# Patient Record
Sex: Female | Born: 1935 | Race: White | Hispanic: No | Smoking: Never smoker
Health system: Southern US, Community
[De-identification: ages and names within clinical notes are randomized; demographics above are authoritative.]

## PROBLEM LIST (undated history)

## (undated) DIAGNOSIS — M353 Polymyalgia rheumatica: Secondary | ICD-10-CM

## (undated) DIAGNOSIS — I10 Essential (primary) hypertension: Secondary | ICD-10-CM

## (undated) DIAGNOSIS — I4891 Unspecified atrial fibrillation: Secondary | ICD-10-CM

## (undated) DIAGNOSIS — E876 Hypokalemia: Secondary | ICD-10-CM

## (undated) DIAGNOSIS — I517 Cardiomegaly: Secondary | ICD-10-CM

## (undated) DIAGNOSIS — R002 Palpitations: Secondary | ICD-10-CM

## (undated) DIAGNOSIS — G479 Sleep disorder, unspecified: Secondary | ICD-10-CM

## (undated) DIAGNOSIS — C801 Malignant (primary) neoplasm, unspecified: Secondary | ICD-10-CM

## (undated) DIAGNOSIS — K219 Gastro-esophageal reflux disease without esophagitis: Secondary | ICD-10-CM

## (undated) DIAGNOSIS — E039 Hypothyroidism, unspecified: Secondary | ICD-10-CM

## (undated) DIAGNOSIS — I509 Heart failure, unspecified: Secondary | ICD-10-CM

## (undated) DIAGNOSIS — Z7901 Long term (current) use of anticoagulants: Secondary | ICD-10-CM

## (undated) DIAGNOSIS — H18509 Unspecified hereditary corneal dystrophies, unspecified eye: Secondary | ICD-10-CM

## (undated) DIAGNOSIS — U071 COVID-19: Secondary | ICD-10-CM

## (undated) DIAGNOSIS — Z87442 Personal history of urinary calculi: Secondary | ICD-10-CM

## (undated) DIAGNOSIS — I38 Endocarditis, valve unspecified: Secondary | ICD-10-CM

## (undated) DIAGNOSIS — I700 Atherosclerosis of aorta: Secondary | ICD-10-CM

## (undated) DIAGNOSIS — M1990 Unspecified osteoarthritis, unspecified site: Secondary | ICD-10-CM

## (undated) DIAGNOSIS — I5020 Unspecified systolic (congestive) heart failure: Secondary | ICD-10-CM

## (undated) DIAGNOSIS — F32A Depression, unspecified: Secondary | ICD-10-CM

## (undated) DIAGNOSIS — M86671 Other chronic osteomyelitis, right ankle and foot: Secondary | ICD-10-CM

## (undated) DIAGNOSIS — J189 Pneumonia, unspecified organism: Secondary | ICD-10-CM

## (undated) DIAGNOSIS — E785 Hyperlipidemia, unspecified: Secondary | ICD-10-CM

## (undated) HISTORY — PX: FOOT SURGERY: SHX648

## (undated) HISTORY — PX: MASTECTOMY: SHX3

## (undated) HISTORY — PX: COLONOSCOPY: SHX174

---

## 1982-07-12 DIAGNOSIS — C50919 Malignant neoplasm of unspecified site of unspecified female breast: Secondary | ICD-10-CM

## 1982-07-12 HISTORY — DX: Malignant neoplasm of unspecified site of unspecified female breast: C50.919

## 2004-05-18 ENCOUNTER — Ambulatory Visit: Payer: Self-pay | Admitting: Family Medicine

## 2005-03-22 ENCOUNTER — Ambulatory Visit: Payer: Self-pay | Admitting: Ophthalmology

## 2005-03-29 ENCOUNTER — Ambulatory Visit: Payer: Self-pay | Admitting: Ophthalmology

## 2005-05-24 ENCOUNTER — Ambulatory Visit: Payer: Self-pay | Admitting: Ophthalmology

## 2005-05-31 ENCOUNTER — Ambulatory Visit: Payer: Self-pay | Admitting: Ophthalmology

## 2005-06-21 ENCOUNTER — Ambulatory Visit: Payer: Self-pay | Admitting: Family Medicine

## 2005-09-22 ENCOUNTER — Ambulatory Visit: Payer: Self-pay | Admitting: Family Medicine

## 2005-09-23 ENCOUNTER — Ambulatory Visit: Payer: Self-pay | Admitting: Family Medicine

## 2006-06-30 ENCOUNTER — Ambulatory Visit: Payer: Self-pay | Admitting: Family Medicine

## 2006-07-11 ENCOUNTER — Ambulatory Visit: Payer: Self-pay | Admitting: Family Medicine

## 2006-08-17 ENCOUNTER — Emergency Department: Payer: Self-pay | Admitting: Unknown Physician Specialty

## 2006-08-17 ENCOUNTER — Other Ambulatory Visit: Payer: Self-pay

## 2009-12-25 ENCOUNTER — Ambulatory Visit: Payer: Self-pay | Admitting: Internal Medicine

## 2010-01-28 ENCOUNTER — Ambulatory Visit: Payer: Self-pay | Admitting: Internal Medicine

## 2010-01-30 ENCOUNTER — Ambulatory Visit: Payer: Self-pay | Admitting: Internal Medicine

## 2010-02-02 ENCOUNTER — Ambulatory Visit: Payer: Self-pay | Admitting: Internal Medicine

## 2011-08-31 ENCOUNTER — Ambulatory Visit: Payer: Self-pay | Admitting: Rheumatology

## 2012-07-27 ENCOUNTER — Emergency Department: Payer: Self-pay | Admitting: Emergency Medicine

## 2012-07-27 LAB — CK TOTAL AND CKMB (NOT AT ARMC)
CK, Total: 81 U/L (ref 21–215)
CK-MB: 1.2 ng/mL (ref 0.5–3.6)

## 2012-07-27 LAB — COMPREHENSIVE METABOLIC PANEL
Alkaline Phosphatase: 82 U/L (ref 50–136)
Anion Gap: 10 (ref 7–16)
BUN: 22 mg/dL — ABNORMAL HIGH (ref 7–18)
Bilirubin,Total: 0.6 mg/dL (ref 0.2–1.0)
Calcium, Total: 9 mg/dL (ref 8.5–10.1)
Co2: 21 mmol/L (ref 21–32)
Creatinine: 1.03 mg/dL (ref 0.60–1.30)
EGFR (African American): 57 — ABNORMAL LOW
EGFR (Non-African Amer.): 50 — ABNORMAL LOW
Osmolality: 281 (ref 275–301)
Potassium: 4.2 mmol/L (ref 3.5–5.1)
SGPT (ALT): 33 U/L (ref 12–78)
Sodium: 136 mmol/L (ref 136–145)

## 2012-07-27 LAB — CBC
HCT: 42.4 % (ref 35.0–47.0)
HGB: 14.4 g/dL (ref 12.0–16.0)
MCH: 31.8 pg (ref 26.0–34.0)
MCHC: 34.1 g/dL (ref 32.0–36.0)
Platelet: 392 10*3/uL (ref 150–440)
RBC: 4.54 10*6/uL (ref 3.80–5.20)
WBC: 10.6 10*3/uL (ref 3.6–11.0)

## 2012-07-27 LAB — MAGNESIUM: Magnesium: 1.7 mg/dL — ABNORMAL LOW

## 2012-12-20 DIAGNOSIS — I10 Essential (primary) hypertension: Secondary | ICD-10-CM | POA: Insufficient documentation

## 2018-05-29 ENCOUNTER — Emergency Department
Admission: EM | Admit: 2018-05-29 | Discharge: 2018-05-29 | Payer: Medicare Other | Attending: Emergency Medicine | Admitting: Emergency Medicine

## 2018-05-29 ENCOUNTER — Emergency Department: Payer: Medicare Other

## 2018-05-29 DIAGNOSIS — Z7901 Long term (current) use of anticoagulants: Secondary | ICD-10-CM | POA: Diagnosis not present

## 2018-05-29 DIAGNOSIS — E785 Hyperlipidemia, unspecified: Secondary | ICD-10-CM | POA: Diagnosis not present

## 2018-05-29 DIAGNOSIS — R509 Fever, unspecified: Secondary | ICD-10-CM | POA: Diagnosis not present

## 2018-05-29 DIAGNOSIS — R2241 Localized swelling, mass and lump, right lower limb: Secondary | ICD-10-CM | POA: Diagnosis not present

## 2018-05-29 DIAGNOSIS — L03115 Cellulitis of right lower limb: Secondary | ICD-10-CM | POA: Diagnosis not present

## 2018-05-29 DIAGNOSIS — Y69 Unspecified misadventure during surgical and medical care: Secondary | ICD-10-CM | POA: Diagnosis not present

## 2018-05-29 DIAGNOSIS — A419 Sepsis, unspecified organism: Secondary | ICD-10-CM | POA: Diagnosis present

## 2018-05-29 DIAGNOSIS — T8140XA Infection following a procedure, unspecified, initial encounter: Secondary | ICD-10-CM | POA: Insufficient documentation

## 2018-05-29 DIAGNOSIS — Z79899 Other long term (current) drug therapy: Secondary | ICD-10-CM | POA: Diagnosis not present

## 2018-05-29 HISTORY — DX: Hyperlipidemia, unspecified: E78.5

## 2018-05-29 LAB — COMPREHENSIVE METABOLIC PANEL
ALBUMIN: 2.9 g/dL — AB (ref 3.5–5.0)
ALT: 29 U/L (ref 0–44)
AST: 38 U/L (ref 15–41)
Alkaline Phosphatase: 102 U/L (ref 38–126)
Anion gap: 8 (ref 5–15)
BILIRUBIN TOTAL: 1 mg/dL (ref 0.3–1.2)
BUN: 30 mg/dL — ABNORMAL HIGH (ref 8–23)
CHLORIDE: 95 mmol/L — AB (ref 98–111)
CO2: 28 mmol/L (ref 22–32)
CREATININE: 0.91 mg/dL (ref 0.44–1.00)
Calcium: 8 mg/dL — ABNORMAL LOW (ref 8.9–10.3)
GFR calc Af Amer: >60 mL/min (ref >60)
GFR calc non Af Amer: 54 mL/min — ABNORMAL LOW (ref >60)
GLUCOSE: 130 mg/dL — AB (ref 70–99)
POTASSIUM: 4 mmol/L (ref 3.5–5.1)
Sodium: 131 mmol/L — ABNORMAL LOW (ref 135–145)
Total Protein: 6.4 g/dL — ABNORMAL LOW (ref 6.5–8.1)

## 2018-05-29 LAB — CBC WITH DIFFERENTIAL/PLATELET
ABS IMMATURE GRANULOCYTES: 0.23 10*3/uL — AB (ref 0.00–0.07)
BASOS ABS: 0.1 10*3/uL (ref 0.0–0.1)
Basophils Relative: 0 %
Eosinophils Absolute: 0 10*3/uL (ref 0.0–0.5)
Eosinophils Relative: 0 %
HCT: 37.1 % (ref 36.0–46.0)
HEMOGLOBIN: 11.8 g/dL — AB (ref 12.0–15.0)
IMMATURE GRANULOCYTES: 1 %
LYMPHS ABS: 0.8 10*3/uL (ref 0.7–4.0)
LYMPHS PCT: 4 %
MCH: 30.5 pg (ref 26.0–34.0)
MCHC: 31.8 g/dL (ref 30.0–36.0)
MCV: 95.9 fL (ref 80.0–100.0)
MONOS PCT: 7 %
Monocytes Absolute: 1.5 10*3/uL — ABNORMAL HIGH (ref 0.1–1.0)
NEUTROS ABS: 19 10*3/uL — AB (ref 1.7–7.7)
NEUTROS PCT: 88 %
NRBC: 0 % (ref 0.0–0.2)
Platelets: 528 10*3/uL — ABNORMAL HIGH (ref 150–400)
RBC: 3.87 MIL/uL (ref 3.87–5.11)
RDW: 13.2 % (ref 11.5–15.5)
WBC: 21.6 10*3/uL — ABNORMAL HIGH (ref 4.0–10.5)

## 2018-05-29 LAB — PROTIME-INR
INR: 1.55
Prothrombin Time: 18.4 seconds — ABNORMAL HIGH (ref 11.4–15.2)

## 2018-05-29 LAB — BRAIN NATRIURETIC PEPTIDE: B Natriuretic Peptide: 374 pg/mL — ABNORMAL HIGH (ref 0.0–100.0)

## 2018-05-29 LAB — TROPONIN I: Troponin I: 0.03 ng/mL (ref <0.03)

## 2018-05-29 MED ORDER — SODIUM CHLORIDE 0.9 % IV BOLUS
500.0000 mL | Freq: Once | INTRAVENOUS | Status: AC
  Administered 2018-05-29: 500 mL via INTRAVENOUS

## 2018-05-29 MED ORDER — FENTANYL CITRATE (PF) 100 MCG/2ML IJ SOLN
50.0000 ug | Freq: Once | INTRAMUSCULAR | Status: AC
  Administered 2018-05-29: 50 ug via INTRAVENOUS
  Filled 2018-05-29: qty 2

## 2018-05-29 MED ORDER — VANCOMYCIN HCL IN DEXTROSE 1-5 GM/200ML-% IV SOLN
1000.0000 mg | Freq: Once | INTRAVENOUS | Status: AC
  Administered 2018-05-29: 1000 mg via INTRAVENOUS
  Filled 2018-05-29: qty 200

## 2018-05-29 MED ORDER — SODIUM CHLORIDE 0.9 % IV SOLN
2.0000 g | INTRAVENOUS | Status: DC
  Administered 2018-05-29: 2 g via INTRAVENOUS
  Filled 2018-05-29: qty 20

## 2018-05-29 MED ORDER — ONDANSETRON HCL 4 MG/2ML IJ SOLN
INTRAMUSCULAR | Status: AC
  Administered 2018-05-29: 10:00:00
  Filled 2018-05-29: qty 2

## 2018-05-29 MED ORDER — ACETAMINOPHEN 325 MG PO TABS
650.0000 mg | ORAL_TABLET | Freq: Once | ORAL | Status: AC
  Administered 2018-05-29: 650 mg via ORAL

## 2018-05-29 NOTE — ED Notes (Signed)
ED TO INPATIENT HANDOFF REPORT  Name/Age/Gender Judith Keller 84 y.o. female  Code Status   Home/SNF/Other Rehab  Chief Complaint sepsis ems  Level of Care/Admitting Diagnosis ED Disposition    ED Disposition Condition Comment   Transfer to Another Facility  The patient appears reasonably stabilized for transfer considering the current resources, flow, and capabilities available in the ED at this time, and I doubt any other [facility] requiring further screening and/or treatment in the ED prior to transfer is p resent.       Medical History Past Medical History:  Diagnosis Date  . Hyperlipidemia     Allergies Not on File  IV Location/Drains/Wounds Patient Lines/Drains/Airways Status   Active Line/Drains/Airways    Name:   Placement date:   Placement time:   Site:   Days:   Peripheral IV 05/29/18 Left Wrist   05/29/18    0757    Wrist   less than 1   Peripheral IV 05/29/18 Left Forearm   05/29/18    0822    Forearm   less than 1          Labs/Imaging Results for orders placed or performed during the hospital encounter of 05/29/18 (from the past 48 hour(s))  Comprehensive metabolic panel     Status: Abnormal   Collection Time: 05/29/18  8:00 AM  Result Value Ref Range   Sodium 131 (L) 135 - 145 mmol/L   Potassium 4.0 3.5 - 5.1 mmol/L   Chloride 95 (L) 98 - 111 mmol/L   CO2 28 22 - 32 mmol/L   Glucose, Bld 130 (H) 70 - 99 mg/dL   BUN 30 (H) 8 - 23 mg/dL   Creatinine, Ser 0.91 0.44 - 1.00 mg/dL   Calcium 8.0 (L) 8.9 - 10.3 mg/dL   Total Protein 6.4 (L) 6.5 - 8.1 g/dL   Albumin 2.9 (L) 3.5 - 5.0 g/dL   AST 38 15 - 41 U/L   ALT 29 0 - 44 U/L   Alkaline Phosphatase 102 38 - 126 U/L   Total Bilirubin 1.0 0.3 - 1.2 mg/dL   GFR calc non Af Amer 54 (L) >60 mL/min   GFR calc Af Amer >60 >60 mL/min    Comment: (NOTE) The eGFR has been calculated using the CKD EPI equation. This calculation has not been validated in all clinical situations. eGFR's persistently <60  mL/min signify possible Chronic Kidney Disease.    Anion gap 8 5 - 15    Comment: Performed at [facility], [address] [place], [place] [address]  Brain natriuretic peptide     Status: Abnormal   Collection Time: 05/29/18  8:00 AM  Result Value Ref Range   B Natriuretic Peptide 374.0 (H) 0.0 - 100.0 pg/mL    Comment: Performed at [facility], [address] [place], [place] [address]  Troponin I - ONCE - STAT     Status: Abnormal   Collection Time: 05/29/18  8:00 AM  Result Value Ref Range   Troponin I 0.03 (HH) <0.03 ng/mL    Comment: CRITICAL RESULT CALLED TO, READ BACK BY AND VERIFIED WITH CASSIE [facility] AT [place] 05/29/18 DAS Performed at [facility], [address] [place], [place] [address]   CBC WITH DIFFERENTIAL     Status: Abnormal   Collection Time: 05/29/18  8:00 AM  Result Value Ref Range   WBC 21.6 (H) 4.0 - 10.5 K/uL   RBC 3.87 3.87 - 5.11 MIL/uL  Hemoglobin 11.8 (L) 12.0 - 15.0 g/dL   HCT 37.1 36.0 - 46.0 %   MCV 95.9 80.0 - 100.0 fL   MCH 30.5 26.0 - 34.0 pg   MCHC 31.8 30.0 - 36.0 g/dL   RDW 13.2 11.5 - 15.5 %   Platelets 528 (H) 150 - 400 K/uL   nRBC 0.0 0.0 - 0.2 %   Neutrophils Relative % 88 %   Neutro Abs 19.0 (H) 1.7 - 7.7 K/uL   Lymphocytes Relative 4 %   Lymphs Abs 0.8 0.7 - 4.0 K/uL   Monocytes Relative 7 %   Monocytes Absolute 1.5 (H) 0.1 - 1.0 K/uL   Eosinophils Relative 0 %   Eosinophils Absolute 0.0 0.0 - 0.5 K/uL   Basophils Relative 0 %   Basophils Absolute 0.1 0.0 - 0.1 K/uL   Immature Granulocytes 1 %   Abs Immature Granulocytes 0.23 (H) 0.00 - 0.07 K/uL    Comment: Performed at [facility], [address] [place], [place] [address]  Protime-INR     Status: Abnormal   Collection Time: 05/29/18  8:00 AM  Result Value Ref Range   Prothrombin Time 18.4 (H) 11.4 - 15.2 seconds   INR 1.55     Comment: Performed at [facility], [address], [place], [place] [address]   Dg Ankle  Complete Right  Result Date: 05/29/2018 CLINICAL DATA:  Fever, pain, and drainage from surgical site at the right ankle. The patient undergone previous ORIF for a bimalleolar fracture. EXAM: RIGHT ANKLE - COMPLETE 3+ VIEW COMPARISON:  None in PACs FINDINGS: There is diffuse soft tissue swelling. There is calcification in the soft tissues superficial to the medial aspect of the distal tibial metaphysis. The medial malleolar fracture remains ununited. The surgical pins at this site are in reasonable position. The fracture line obliquely through the distal fibular metaphysis remains visible. The metallic plate and cortical screws appear reasonable in position. The ankle joint mortise is mildly widened medially. The talus and calcaneus are grossly intact. IMPRESSION: Diffuse soft tissue swelling which may reflect cellulitis. No objective evidence of osteomyelitis. The fracture lines which have undergone ORIF remain visible. Electronically Signed   By: David  Martinique M.D.   On: 05/29/2018 08:48   Dg Chest Port 1 View  Result Date: 05/29/2018 CLINICAL DATA:  Pain and fever and range from fracture site EXAM: PORTABLE CHEST 1 VIEW COMPARISON:  Portable chest x-ray of July 27, 2012 FINDINGS: The lungs are less well inflated today. There are coarse lung markings in the right correction left upper lobe and left mid to lower lung suggesting atelectasis. The cardiac silhouette is enlarged. The pulmonary vascularity is not engorged. The observed bony structures are unremarkable. IMPRESSION: Probable subsegmental atelectasis in the left lung. Borderline hypoinflation. When the patient can tolerate the procedure a PA and lateral chest x-ray with deep inspiratory effort would be useful. Electronically Signed   By: David  Martinique M.D.   On: 05/29/2018 08:46    Pending Labs Unresulted Labs (From admission, onward)    Start     Ordered   05/29/18 0816  Blood Culture (routine x 2)  BLOOD CULTURE X 2,   STAT     05/29/18  0815   05/29/18 0816  Urinalysis, Complete w Microscopic  ONCE - STAT,   STAT     05/29/18 0815          Vitals/Pain Today's Vitals   05/29/18 0930 05/29/18 0945 05/29/18 1000 05/29/18 1009  BP:   123/65 123/65  Pulse:  (!) 50  (!) 124  Resp: (!) 25 (!) 27 (!) 26 (!) 25  Temp:    99.9 F (37.7 C)  TempSrc:      SpO2:  (!) 76%  94%  Weight:        Isolation Precautions No active isolations  Medications Medications  vancomycin (VANCOCIN) IVPB 1000 mg/200 mL premix (1,000 mg Intravenous New Bag/Given 05/29/18 0952)  cefTRIAXone (ROCEPHIN) 2 g in sodium chloride 0.9 % 100 mL IVPB (2 g Intravenous New Bag/Given 05/29/18 0915)  ondansetron (ZOFRAN) 4 MG/2ML injection (has no administration in time range)  acetaminophen (TYLENOL) tablet 650 mg (650 mg Oral Given 05/29/18 0840)  sodium chloride 0.9 % bolus 500 mL (0 mLs Intravenous Stopped 05/29/18 0955)  fentaNYL (SUBLIMAZE) injection 50 mcg (50 mcg Intravenous Given 05/29/18 0931)    Mobility walks with device

## 2018-05-29 NOTE — ED Notes (Signed)
Judith Keller stated 1 hr 10 mins till arrival

## 2018-05-29 NOTE — ED Provider Notes (Signed)
[facility] Emergency Department Provider Note ____________________________________________   First MD Initiated Contact with Patient 05/29/18 0800     (approximate)  I have reviewed the triage vital signs and the nursing notes.   HISTORY  Chief Complaint Code Sepsis    HPI Judith Keller is a 84 y.o. female who presents with apparent altered mental status, noted by staff at her rehab this morning, and now resolved.  Per EMS, the patient is back to her baseline.  In addition the patient had a fever last night and this morning.  The patient herself denies any acute complaints.  She is in rehab after right ankle ORIF 12 days ago for a fracture.  Past Medical History:  Diagnosis Date  . Hyperlipidemia     There are no active problems to display for this patient.   History reviewed. No pertinent surgical history.  Prior to Admission medications   Medication Sig Start Date End Date Taking? Authorizing Provider  acetaminophen (TYLENOL) 500 MG tablet Take 1,000 mg by mouth every 8 (eight) hours as needed.   Yes [provider]  calcium carbonate (OSCAL) 1500 (600 Ca) MG TABS tablet Take 1,500 mg by mouth 2 (two) times daily with a meal.   Yes [provider]  cholecalciferol (VITAMIN D) 25 MCG (1000 UT) tablet Take 2,000 Units by mouth daily.   Yes [provider]  diltiazem (DILACOR XR) 120 MG 24 hr capsule Take 120 mg by mouth daily.   Yes [provider]  hydrocortisone (ANUSOL-HC) 25 MG suppository Place 25 mg rectally every 8 (eight) hours as needed for hemorrhoids or anal itching.   Yes [provider]  metoprolol succinate (TOPROL-XL) 50 MG 24 hr tablet Take 50 mg by mouth daily. Take with or immediately following a meal.   Yes [provider]  Multiple Vitamin (MULTIVITAMIN WITH MINERALS) TABS tablet Take 1 tablet by mouth daily.   Yes [provider]  omeprazole (PRILOSEC) 20 MG capsule Take  20 mg by mouth daily.   Yes [provider]  predniSONE (DELTASONE) 5 MG tablet Take 5 mg by mouth daily with breakfast.   Yes [provider]  simvastatin (ZOCOR) 20 MG tablet Take 20 mg by mouth daily.   Yes [provider]  warfarin (COUMADIN) 2 MG tablet Take 2 mg by mouth daily. Take 2mg  on Sunday, Tuesday, Thursday, Friday, and Saturday   Yes [provider]  warfarin (COUMADIN) 4 MG tablet Take 4 mg by mouth daily. Take on Monday and Wednesday   Yes [provider]    Allergies Patient has no allergy information on record.  No family history on file.  Social History Social History   Tobacco Use  . Smoking status: Not on file  Substance Use Topics  . Alcohol use: Not on file  . Drug use: Not on file    Review of Systems  Constitutional: Positive for fever. Eyes: No redness. ENT: No sore throat. Cardiovascular: Denies chest pain. Respiratory: Denies shortness of breath. Gastrointestinal: No vomiting.  Genitourinary: Negative for dysuria.  Musculoskeletal: Negative for back pain. Skin: Positive for right ankle rash. Neurological: Negative for headache.   ____________________________________________   PHYSICAL EXAM:  VITAL SIGNS: ED Triage Vitals  Enc Vitals Group     BP 05/29/18 0757 126/66     Pulse Rate 05/29/18 0757 91     Resp 05/29/18 0757 (!) 26     Temp 05/29/18 0757 99.9 F (37.7 C)  Temp Source 05/29/18 0757 Oral     SpO2 05/29/18 0757 93 %     Weight 05/29/18 0753 160 lb (72.6 kg)     Height --      Head Circumference --      Peak Flow --      Pain Score --      Pain Loc --      Pain Edu? --      Excl. in [place]? --     Constitutional: Alert and oriented.  Relatively well appearing for age and in no acute distress. Eyes: Conjunctivae are normal.  EOMI Head: Atraumatic. Nose: No congestion/rhinnorhea. Mouth/Throat: Mucous membranes are slightly dry.   Neck: Normal range of motion.    Cardiovascular: Tachycardic, irregular rhythm. Grossly normal heart sounds.  Good peripheral circulation. Respiratory: Normal respiratory effort.  No retractions. Lungs CTAB. Gastrointestinal: Soft and nontender. No distention.  Genitourinary: No flank tenderness. Musculoskeletal: Extremities warm and well perfused.  Right ankle with recent incision sites from ORIF.  There is significant swelling, erythema, induration from the dorsum of the foot up through the ankle.  There is granulation tissue at the wound sites with no dehiscence.  No pus expressed with pressure. Neurologic: Motor intact in all extremities.  No gross focal neurologic deficits are appreciated.  Skin:  Skin is warm and dry.  Right ankle rash as described above. Psychiatric: Mood and affect are normal. Speech and behavior are normal.  ____________________________________________   LABS (all labs ordered are listed, but only abnormal results are displayed)  Labs Reviewed  COMPREHENSIVE METABOLIC PANEL - Abnormal; Notable for the following components:      Result Value   Sodium 131 (*)    Chloride 95 (*)    Glucose, Bld 130 (*)    BUN 30 (*)    Calcium 8.0 (*)    Total Protein 6.4 (*)    Albumin 2.9 (*)    GFR calc non Af Amer 54 (*)    All other components within normal limits  BRAIN NATRIURETIC PEPTIDE - Abnormal; Notable for the following components:   B Natriuretic Peptide 374.0 (*)    All other components within normal limits  TROPONIN I - Abnormal; Notable for the following components:   Troponin I 0.03 (*)    All other components within normal limits  CBC WITH DIFFERENTIAL/PLATELET - Abnormal; Notable for the following components:   WBC 21.6 (*)    Hemoglobin 11.8 (*)    Platelets 528 (*)    Neutro Abs 19.0 (*)    Monocytes Absolute 1.5 (*)    Abs Immature Granulocytes 0.23 (*)    All other components within normal limits  PROTIME-INR - Abnormal; Notable for the following components:   Prothrombin Time  18.4 (*)    All other components within normal limits  CULTURE, BLOOD (ROUTINE X 2)  CULTURE, BLOOD (ROUTINE X 2)  URINALYSIS, COMPLETE (UACMP) WITH MICROSCOPIC  I-STAT CG4 LACTIC ACID, ED  I-STAT CG4 LACTIC ACID, ED   ____________________________________________  EKG  ED ECG REPORT I, Arta Silence, the attending physician, personally viewed and interpreted this ECG.  Date: 05/29/2018 EKG Time: 0757 Rate: 116 Rhythm: Atrial fibrillation  QRS Axis: normal Intervals: normal ST/T Wave abnormalities: normal Narrative Interpretation: no evidence of acute ischemia  ____________________________________________  RADIOLOGY  CXR: No focal infiltrate XR R ankle: Soft tissue swelling with no bony abnormality other than the known fractures  ____________________________________________   PROCEDURES  Procedure(s) performed: No  Procedures  Critical Care  performed: No ____________________________________________   INITIAL IMPRESSION / ASSESSMENT AND PLAN / ED COURSE  Pertinent labs & imaging results that were available during my care of the patient were reviewed by me and considered in my medical decision making (see chart for details).  84 year old female with PMH as noted above and status post recent right ankle fracture and ORIF presents with resolved altered mental status, fever, and tachycardia, with redness and erythema around the right ankle surgical site.  On exam the vital signs are normal except for tachycardia and low-grade temperature.  The exam is otherwise significant for erythema and induration of the right ankle.  The presentation overall is most consistent with right ankle cellulitis related to the patient's surgical wounds.  Differential includes other infections such as pneumonia or UTI although these are less likely as the patient has no other symptoms.  We will obtain labs, sepsis work-up, x-ray of the ankle, and reassess.  I will contact the  orthopedic department at [facility] where the patient had the surgery, for possible transfer.  ----------------------------------------- 11:34 AM on 05/29/2018 -----------------------------------------  I contacted the [facility] transfer center.  I discussed the case with the orthopedist on-call who agreed to accept the patient to the ED for further evaluation and determination of appropriate disposition, whether to their service or medicine.  I also discussed the case with Dr. Ron Parker from the emergency department who agreed to accept the patient in transfer.  The patient agrees with the plan.  Antibiotics have been given.  The vital signs remained stable with some slight tachycardia but normal blood pressure.  The patient is stable for transfer at this time. ____________________________________________   FINAL CLINICAL IMPRESSION(S) / ED DIAGNOSES  Final diagnoses:  Cellulitis of right lower extremity      NEW MEDICATIONS STARTED DURING THIS VISIT:  New Prescriptions   No medications on file     Note:  This document was prepared using Dragon voice recognition software and may include unintentional dictation errors.    Arta Silence, MD 05/29/18 1135

## 2018-05-29 NOTE — ED Notes (Signed)
All Labs sent at this time. Blood cultures x2 used Kerin device. RN and Helene Kelp RN completed Lab draw. Heather RN completed ISTAT LAC and 1.4. EDP aware

## 2018-05-29 NOTE — ED Notes (Signed)
X-Ray at bedside.

## 2018-05-29 NOTE — ED Notes (Signed)
.   Pt is resting, Respirations even and unlabored, NAD. Stretcher lowest postion and locked. Call bell within reach. Denies any needs at this time RN will continue to monitor.    

## 2018-05-29 NOTE — ED Notes (Signed)
.  This RN Curlene Labrum received verbal orders to administer: 4mg  of Zofran Administered 9:59 AM. RN will continue to monitor.

## 2018-05-29 NOTE — ED Notes (Signed)
EMTALA reviewed by charge RN 

## 2018-05-29 NOTE — ED Notes (Signed)
Pt's son [name] notified at this time or transport per pt request.

## 2018-05-29 NOTE — ED Triage Notes (Signed)
Pt arrives via [facility] from [facility] pt had ankle fracture was there for rehab. Staff report change in status. Redness and drainage from site. Back at baseline  Complaint: Sepsis vss: 100.4 Temp 120 HR 173 CBG 2/L 98% 108/55   EMS coded SEPSIS

## 2018-05-29 NOTE — ED Notes (Signed)
[facility]  CALLED  FOR  TRANSFER

## 2018-05-29 NOTE — ED Notes (Signed)
Yell RN @ [facility]   Report given at this time. RN will monitor as transport states 1 hr till arrival.

## 2018-05-30 LAB — CG4 I-STAT (LACTIC ACID): Lactic Acid, Venous: 1.41 mmol/L (ref 0.5–1.9)

## 2018-06-03 LAB — CULTURE, BLOOD (ROUTINE X 2)
CULTURE: NO GROWTH
Culture: NO GROWTH
SPECIAL REQUESTS: ADEQUATE
Special Requests: ADEQUATE

## 2018-06-06 MED ORDER — LIDOCAINE VISCOUS HCL 2 % MT SOLN
15.00 | OROMUCOSAL | Status: DC
Start: ? — End: 2018-06-06

## 2018-06-06 MED ORDER — MENTHOL 9.1 MG MT LOZG
1.00 | LOZENGE | OROMUCOSAL | Status: DC
Start: ? — End: 2018-06-06

## 2018-06-06 MED ORDER — METOPROLOL SUCCINATE ER 50 MG PO TB24
75.00 | ORAL_TABLET | ORAL | Status: DC
Start: 2018-06-07 — End: 2018-06-06

## 2018-06-06 MED ORDER — POLYETHYLENE GLYCOL 3350 17 G PO PACK
17.00 | PACK | ORAL | Status: DC
Start: 2018-06-07 — End: 2018-06-06

## 2018-06-06 MED ORDER — DILTIAZEM HCL ER COATED BEADS 120 MG PO CP24
120.00 | ORAL_CAPSULE | ORAL | Status: DC
Start: 2018-06-07 — End: 2018-06-06

## 2018-06-06 MED ORDER — SENNOSIDES 8.6 MG PO TABS
2.00 | ORAL_TABLET | ORAL | Status: DC
Start: 2018-06-06 — End: 2018-06-06

## 2018-06-06 MED ORDER — PRAVASTATIN SODIUM 40 MG PO TABS
40.00 | ORAL_TABLET | ORAL | Status: DC
Start: 2018-06-06 — End: 2018-06-06

## 2018-06-06 MED ORDER — GENERIC EXTERNAL MEDICATION
10.00 | Status: DC
Start: ? — End: 2018-06-06

## 2018-06-06 MED ORDER — PREDNISONE 5 MG PO TABS
5.00 | ORAL_TABLET | ORAL | Status: DC
Start: 2018-06-07 — End: 2018-06-06

## 2018-06-06 MED ORDER — ACETAMINOPHEN 325 MG PO TABS
650.00 | ORAL_TABLET | ORAL | Status: DC
Start: ? — End: 2018-06-06

## 2018-06-06 MED ORDER — ALBUTEROL SULFATE (2.5 MG/3ML) 0.083% IN NEBU
2.50 | INHALATION_SOLUTION | RESPIRATORY_TRACT | Status: DC
Start: ? — End: 2018-06-06

## 2018-06-06 MED ORDER — PANTOPRAZOLE SODIUM 40 MG PO TBEC
40.00 | DELAYED_RELEASE_TABLET | ORAL | Status: DC
Start: 2018-06-07 — End: 2018-06-06

## 2018-06-06 MED ORDER — LINEZOLID 600 MG PO TABS
600.00 | ORAL_TABLET | ORAL | Status: DC
Start: 2018-06-06 — End: 2018-06-06

## 2018-06-06 MED ORDER — GENERIC EXTERNAL MEDICATION
5.00 | Status: DC
Start: ? — End: 2018-06-06

## 2018-06-06 MED ORDER — WARFARIN SODIUM 1 MG PO TABS
2.00 | ORAL_TABLET | ORAL | Status: DC
Start: 2018-06-06 — End: 2018-06-06

## 2018-07-21 ENCOUNTER — Encounter: Payer: Self-pay | Admitting: Urology

## 2018-07-21 ENCOUNTER — Ambulatory Visit: Payer: Self-pay | Admitting: Urology

## 2018-08-04 ENCOUNTER — Ambulatory Visit: Payer: Self-pay | Admitting: Urology

## 2018-09-19 DIAGNOSIS — I5032 Chronic diastolic (congestive) heart failure: Secondary | ICD-10-CM | POA: Insufficient documentation

## 2018-10-02 ENCOUNTER — Other Ambulatory Visit
Admission: RE | Admit: 2018-10-02 | Discharge: 2018-10-02 | Disposition: A | Discharge status: Home / Self Care | Payer: Medicare Other | Source: Skilled Nursing Facility | Attending: Family Medicine | Admitting: Family Medicine

## 2018-10-02 DIAGNOSIS — I4891 Unspecified atrial fibrillation: Secondary | ICD-10-CM | POA: Diagnosis present

## 2018-10-02 DIAGNOSIS — L03115 Cellulitis of right lower limb: Secondary | ICD-10-CM | POA: Diagnosis not present

## 2018-10-02 LAB — PROTIME-INR
INR: 1.2 (ref 0.8–1.2)
Prothrombin Time: 15 seconds (ref 11.4–15.2)

## 2018-11-15 ENCOUNTER — Emergency Department: Payer: Medicare Other

## 2018-11-15 ENCOUNTER — Other Ambulatory Visit: Payer: Self-pay

## 2018-11-15 ENCOUNTER — Inpatient Hospital Stay
Admission: EM | Admit: 2018-11-15 | Discharge: 2018-11-18 | DRG: 470 | Disposition: A | Discharge status: Skilled Nursing Facility | Payer: Medicare Other | Attending: Internal Medicine | Admitting: Internal Medicine

## 2018-11-15 DIAGNOSIS — I10 Essential (primary) hypertension: Secondary | ICD-10-CM | POA: Diagnosis present

## 2018-11-15 DIAGNOSIS — Z853 Personal history of malignant neoplasm of breast: Secondary | ICD-10-CM

## 2018-11-15 DIAGNOSIS — Z9010 Acquired absence of unspecified breast and nipple: Secondary | ICD-10-CM | POA: Diagnosis not present

## 2018-11-15 DIAGNOSIS — Z7901 Long term (current) use of anticoagulants: Secondary | ICD-10-CM | POA: Diagnosis not present

## 2018-11-15 DIAGNOSIS — W1830XA Fall on same level, unspecified, initial encounter: Secondary | ICD-10-CM | POA: Diagnosis present

## 2018-11-15 DIAGNOSIS — Z79899 Other long term (current) drug therapy: Secondary | ICD-10-CM | POA: Diagnosis not present

## 2018-11-15 DIAGNOSIS — I482 Chronic atrial fibrillation, unspecified: Secondary | ICD-10-CM | POA: Diagnosis present

## 2018-11-15 DIAGNOSIS — R296 Repeated falls: Secondary | ICD-10-CM | POA: Diagnosis present

## 2018-11-15 DIAGNOSIS — Z66 Do not resuscitate: Secondary | ICD-10-CM | POA: Diagnosis present

## 2018-11-15 DIAGNOSIS — S72011A Unspecified intracapsular fracture of right femur, initial encounter for closed fracture: Secondary | ICD-10-CM | POA: Diagnosis present

## 2018-11-15 DIAGNOSIS — D72829 Elevated white blood cell count, unspecified: Secondary | ICD-10-CM | POA: Diagnosis not present

## 2018-11-15 DIAGNOSIS — S72001A Fracture of unspecified part of neck of right femur, initial encounter for closed fracture: Secondary | ICD-10-CM

## 2018-11-15 DIAGNOSIS — Z7952 Long term (current) use of systemic steroids: Secondary | ICD-10-CM | POA: Diagnosis not present

## 2018-11-15 DIAGNOSIS — Z01818 Encounter for other preprocedural examination: Secondary | ICD-10-CM

## 2018-11-15 DIAGNOSIS — Z8781 Personal history of (healed) traumatic fracture: Secondary | ICD-10-CM

## 2018-11-15 DIAGNOSIS — Z1159 Encounter for screening for other viral diseases: Secondary | ICD-10-CM

## 2018-11-15 DIAGNOSIS — Z7951 Long term (current) use of inhaled steroids: Secondary | ICD-10-CM | POA: Diagnosis not present

## 2018-11-15 DIAGNOSIS — E785 Hyperlipidemia, unspecified: Secondary | ICD-10-CM | POA: Diagnosis present

## 2018-11-15 DIAGNOSIS — F418 Other specified anxiety disorders: Secondary | ICD-10-CM | POA: Diagnosis present

## 2018-11-15 DIAGNOSIS — W19XXXA Unspecified fall, initial encounter: Secondary | ICD-10-CM

## 2018-11-15 DIAGNOSIS — Z419 Encounter for procedure for purposes other than remedying health state, unspecified: Secondary | ICD-10-CM

## 2018-11-15 HISTORY — DX: Essential (primary) hypertension: I10

## 2018-11-15 HISTORY — DX: Heart failure, unspecified: I50.9

## 2018-11-15 HISTORY — DX: Unspecified atrial fibrillation: I48.91

## 2018-11-15 HISTORY — DX: Hypokalemia: E87.6

## 2018-11-15 HISTORY — DX: Unspecified osteoarthritis, unspecified site: M19.90

## 2018-11-15 LAB — PROTIME-INR
INR: 1.3 — ABNORMAL HIGH (ref 0.8–1.2)
Prothrombin Time: 16.4 seconds — ABNORMAL HIGH (ref 11.4–15.2)

## 2018-11-15 LAB — CBC WITH DIFFERENTIAL/PLATELET
Abs Immature Granulocytes: 0.08 10*3/uL — ABNORMAL HIGH (ref 0.00–0.07)
Basophils Absolute: 0.1 10*3/uL (ref 0.0–0.1)
Basophils Relative: 1 %
Eosinophils Absolute: 0 10*3/uL (ref 0.0–0.5)
Eosinophils Relative: 0 %
HCT: 40.5 % (ref 36.0–46.0)
Hemoglobin: 12 g/dL (ref 12.0–15.0)
Immature Granulocytes: 1 %
Lymphocytes Relative: 3 %
Lymphs Abs: 0.5 10*3/uL — ABNORMAL LOW (ref 0.7–4.0)
MCH: 25.4 pg — ABNORMAL LOW (ref 26.0–34.0)
MCHC: 29.6 g/dL — ABNORMAL LOW (ref 30.0–36.0)
MCV: 85.8 fL (ref 80.0–100.0)
Monocytes Absolute: 1.1 10*3/uL — ABNORMAL HIGH (ref 0.1–1.0)
Monocytes Relative: 7 %
Neutro Abs: 13.8 10*3/uL — ABNORMAL HIGH (ref 1.7–7.7)
Neutrophils Relative %: 88 %
Platelets: 483 10*3/uL — ABNORMAL HIGH (ref 150–400)
RBC: 4.72 MIL/uL (ref 3.87–5.11)
RDW: 17 % — ABNORMAL HIGH (ref 11.5–15.5)
WBC: 15.6 10*3/uL — ABNORMAL HIGH (ref 4.0–10.5)
nRBC: 0 % (ref 0.0–0.2)

## 2018-11-15 LAB — COMPREHENSIVE METABOLIC PANEL
ALT: 17 U/L (ref 0–44)
AST: 25 U/L (ref 15–41)
Albumin: 3.5 g/dL (ref 3.5–5.0)
Alkaline Phosphatase: 80 U/L (ref 38–126)
Anion gap: 12 (ref 5–15)
BUN: 36 mg/dL — ABNORMAL HIGH (ref 8–23)
CO2: 29 mmol/L (ref 22–32)
Calcium: 9.4 mg/dL (ref 8.9–10.3)
Chloride: 93 mmol/L — ABNORMAL LOW (ref 98–111)
Creatinine, Ser: 0.94 mg/dL (ref 0.44–1.00)
GFR calc Af Amer: >60 mL/min (ref >60)
GFR calc non Af Amer: 53 mL/min — ABNORMAL LOW (ref >60)
Glucose, Bld: 129 mg/dL — ABNORMAL HIGH (ref 70–99)
Potassium: 4.7 mmol/L (ref 3.5–5.1)
Sodium: 134 mmol/L — ABNORMAL LOW (ref 135–145)
Total Bilirubin: 1.3 mg/dL — ABNORMAL HIGH (ref 0.3–1.2)
Total Protein: 7.5 g/dL (ref 6.5–8.1)

## 2018-11-15 LAB — TROPONIN I: Troponin I: <0.03 ng/mL (ref <0.03)

## 2018-11-15 LAB — SARS CORONAVIRUS 2 BY RT PCR (HOSPITAL ORDER, PERFORMED IN ~~LOC~~ HOSPITAL LAB): SARS Coronavirus 2: NEGATIVE

## 2018-11-15 LAB — GLUCOSE, CAPILLARY: Glucose-Capillary: 112 mg/dL — ABNORMAL HIGH (ref 70–99)

## 2018-11-15 LAB — MRSA PCR SCREENING: MRSA by PCR: NEGATIVE

## 2018-11-15 MED ORDER — CEFAZOLIN SODIUM-DEXTROSE 2-4 GM/100ML-% IV SOLN
2.0000 g | INTRAVENOUS | Status: AC
  Administered 2018-11-16: 14:00:00 2 g via INTRAVENOUS
  Filled 2018-11-15 (×2): qty 100

## 2018-11-15 MED ORDER — SODIUM CHLORIDE 0.9 % IV SOLN
INTRAVENOUS | Status: DC
  Administered 2018-11-15 – 2018-11-16 (×3): via INTRAVENOUS

## 2018-11-15 MED ORDER — FLUTICASONE PROPIONATE 50 MCG/ACT NA SUSP
2.0000 | Freq: Every day | NASAL | Status: DC
  Administered 2018-11-16: 2 via NASAL
  Filled 2018-11-15: qty 16

## 2018-11-15 MED ORDER — FUROSEMIDE 40 MG PO TABS
40.0000 mg | ORAL_TABLET | Freq: Every day | ORAL | Status: DC
  Administered 2018-11-18: 08:00:00 40 mg via ORAL
  Filled 2018-11-15 (×2): qty 1

## 2018-11-15 MED ORDER — POTASSIUM CHLORIDE CRYS ER 20 MEQ PO TBCR
20.0000 meq | EXTENDED_RELEASE_TABLET | Freq: Two times a day (BID) | ORAL | Status: DC
  Administered 2018-11-15 – 2018-11-18 (×5): 20 meq via ORAL
  Filled 2018-11-15 (×5): qty 1

## 2018-11-15 MED ORDER — TRAMADOL HCL 50 MG PO TABS
50.0000 mg | ORAL_TABLET | Freq: Four times a day (QID) | ORAL | Status: DC | PRN
  Administered 2018-11-16: 50 mg via ORAL
  Filled 2018-11-15: qty 1

## 2018-11-15 MED ORDER — METOPROLOL SUCCINATE ER 50 MG PO TB24
75.0000 mg | ORAL_TABLET | Freq: Every day | ORAL | Status: DC
  Administered 2018-11-16 – 2018-11-18 (×3): 75 mg via ORAL
  Filled 2018-11-15 (×3): qty 1

## 2018-11-15 MED ORDER — ALBUTEROL SULFATE (2.5 MG/3ML) 0.083% IN NEBU: 2.5000 mg | INHALATION_SOLUTION | RESPIRATORY_TRACT | Status: DC | PRN

## 2018-11-15 MED ORDER — MORPHINE SULFATE (PF) 2 MG/ML IV SOLN: 0.5000 mg | INTRAVENOUS | Status: DC | PRN

## 2018-11-15 MED ORDER — SENNA 8.6 MG PO TABS
8.6000 mg | ORAL_TABLET | Freq: Every day | ORAL | Status: DC
  Administered 2018-11-15 – 2018-11-17 (×3): 8.6 mg via ORAL
  Filled 2018-11-15 (×3): qty 1

## 2018-11-15 MED ORDER — PREDNISONE 10 MG PO TABS
5.0000 mg | ORAL_TABLET | Freq: Every day | ORAL | Status: DC
  Administered 2018-11-17 – 2018-11-18 (×2): 5 mg via ORAL
  Filled 2018-11-15 (×2): qty 1

## 2018-11-15 MED ORDER — METOLAZONE 2.5 MG PO TABS
2.5000 mg | ORAL_TABLET | Freq: Every day | ORAL | Status: DC
  Administered 2018-11-17 – 2018-11-18 (×2): 2.5 mg via ORAL
  Filled 2018-11-15 (×3): qty 1

## 2018-11-15 MED ORDER — DILTIAZEM HCL ER COATED BEADS 120 MG PO CP24
120.0000 mg | ORAL_CAPSULE | Freq: Every day | ORAL | Status: DC
  Administered 2018-11-17 – 2018-11-18 (×2): 120 mg via ORAL
  Filled 2018-11-15 (×3): qty 1

## 2018-11-15 MED ORDER — ADULT MULTIVITAMIN W/MINERALS CH
1.0000 | ORAL_TABLET | Freq: Every day | ORAL | Status: DC
  Administered 2018-11-17 – 2018-11-18 (×2): 1 via ORAL
  Filled 2018-11-15 (×2): qty 1

## 2018-11-15 MED ORDER — ACETAMINOPHEN 325 MG PO TABS
650.0000 mg | ORAL_TABLET | Freq: Four times a day (QID) | ORAL | Status: DC | PRN
  Administered 2018-11-17 – 2018-11-18 (×3): 650 mg via ORAL
  Filled 2018-11-15 (×3): qty 2

## 2018-11-15 MED ORDER — CITALOPRAM HYDROBROMIDE 20 MG PO TABS
10.0000 mg | ORAL_TABLET | Freq: Every day | ORAL | Status: DC
  Administered 2018-11-17 – 2018-11-18 (×2): 10 mg via ORAL
  Filled 2018-11-15 (×2): qty 1

## 2018-11-15 MED ORDER — VITAMIN D3 25 MCG (1000 UNIT) PO TABS
2000.0000 [IU] | ORAL_TABLET | Freq: Every day | ORAL | Status: DC
  Administered 2018-11-17 – 2018-11-18 (×2): 2000 [IU] via ORAL
  Filled 2018-11-15 (×5): qty 2

## 2018-11-15 MED ORDER — POLYETHYLENE GLYCOL 3350 17 G PO PACK
17.0000 g | PACK | Freq: Every day | ORAL | Status: DC
  Administered 2018-11-17 – 2018-11-18 (×2): 17 g via ORAL
  Filled 2018-11-15 (×2): qty 1

## 2018-11-15 MED ORDER — TRANEXAMIC ACID-NACL 1000-0.7 MG/100ML-% IV SOLN
1000.0000 mg | INTRAVENOUS | Status: AC
  Administered 2018-11-16: 1000 mg via INTRAVENOUS
  Filled 2018-11-15 (×2): qty 100

## 2018-11-15 MED ORDER — MELATONIN 5 MG PO TABS
5.0000 mg | ORAL_TABLET | Freq: Every day | ORAL | Status: DC
  Administered 2018-11-15 – 2018-11-17 (×3): 5 mg via ORAL
  Filled 2018-11-15 (×4): qty 1

## 2018-11-15 MED ORDER — SIMVASTATIN 20 MG PO TABS
20.0000 mg | ORAL_TABLET | Freq: Every day | ORAL | Status: DC
  Administered 2018-11-15 – 2018-11-17 (×3): 20 mg via ORAL
  Filled 2018-11-15: qty 1
  Filled 2018-11-15 (×2): qty 2
  Filled 2018-11-15: qty 1

## 2018-11-15 NOTE — ED Notes (Signed)
Family updated.

## 2018-11-15 NOTE — H&P (Addendum)
Patient seen and examined, agree with detailed note below. Patient presentation and plan discussed on rounds with APP.    85 year old female with past medical history of atrial fibrillation on Coumadin, hypertension had a fall about a week ago complaining of right hip pain yesterday has an x-ray showing right subcapital fracture and so sent to the emergency room. Patient is a poor historian.  Labs and imaging studies have been reviewed. Ortho has been consulted.  INR is 1.3.  Given recurrent falls she might not be a candidate for long-term anticoagulation.  Coumadin will be held for possible surgery tomorrow. COVID-19 is negative.  Urine analysis is pending at this time.  - Gladstone Lighter, M.D [facility] Pager: [phone]  ------------------------------------------------------------------------------------------------------------------------------------------  [facility] NAME: Judith Keller    MR#:  [id number]  [place]:  [birth year]  DATE OF ADMISSION:  11/15/2018  PRIMARY CARE PHYSICIAN: Harlow Ohms, MD   REQUESTING/REFERRING PHYSICIAN: Lenise Arena, MD  CHIEF COMPLAINT:   Chief Complaint  Patient presents with  . Hip Pain    HISTORY OF PRESENT ILLNESS:  Judith Keller  is a 85 y.o. female with a known history of atrial fibrillation on Coumadin, hypertension, recurrent falls with resultant fracture to right fibula and ankle status post ORIF, breast cancer status post mastectomy, osteomyelitis of right ankle status post debridement and removal of hardware presenting to the ED from [facility] with chief complaints of right hip pain.  Patient is not a good historian so history mostly obtained from patient's chart.  Per chart review, patient has just completed a 6 weeks course of IV vancomycin and ceftazidime for MRSA and Pseudomonas.  She was walking with a walker and transferring independently prior  to her fall over a week ago with resultant injury to her right temple and right side.  Per outside records she had an x-ray yesterday of her right hip which showed a possible right hip fracture therefore she was brought to the ED for further evaluation and treatment.  On arrival to the ED, she was afebrile with blood pressure of 124/67 heart rate 118 and respiration 26.  She was alert and oriented to self only.  Initial labs revealed elevated WBC 15.6 improved from 5 months ago 21.6, slightly decreased kidney functions from baseline. Repeat x-ray of right hip and pelvis showed acute subcapital fracture of the right hip.  Hospitalist were called in to admit patient for further evaluation and treatment of right hip fracture.  PAST MEDICAL HISTORY:   Past Medical History:  Diagnosis Date  . Atrial fibrillation ([place])   . Heart failure ([place])   . Hyperlipidemia   . Hypertension   . Hypokalemia   . Osteoarthritis     PAST SURGICAL HISTORY:   Past Surgical History:  Procedure Laterality Date  . FOOT SURGERY    . MASTECTOMY      SOCIAL HISTORY:   Social History   Tobacco Use  . Smoking status: Never Smoker  . Smokeless tobacco: Never Used  Substance Use Topics  . Alcohol use: Not Currently    FAMILY HISTORY:  No family history on file.  DRUG ALLERGIES:  No Known Allergies  REVIEW OF SYSTEMS:   Unable to obtain review of system from patient as she is not a reliable historian.  MEDICATIONS AT HOME:   Prior to Admission medications   Medication Sig Start Date End Date Taking? Authorizing Provider  acetaminophen (TYLENOL) 325  MG tablet Take 650 mg by mouth every 6 (six) hours as needed for mild pain.   Yes [provider]  acetaminophen (TYLENOL) 650 MG CR tablet Take 650 mg by mouth 2 (two) times daily.   Yes [provider]  albuterol (VENTOLIN HFA) 108 (90 Base) MCG/ACT inhaler Inhale 1 puff into the lungs every 4 (four) hours as needed for wheezing or  shortness of breath.   Yes [provider]  cholecalciferol (VITAMIN D) 25 MCG (1000 UT) tablet Take 2,000 Units by mouth daily.   Yes [provider]  citalopram (CELEXA) 10 MG tablet Take 10 mg by mouth daily.   Yes [provider]  diltiazem (DILACOR XR) 120 MG 24 hr capsule Take 120 mg by mouth daily.   Yes [provider]  fluticasone (FLONASE) 50 MCG/ACT nasal spray Place 2 sprays into both nostrils at bedtime.   Yes [provider]  furosemide (LASIX) 20 MG tablet Take 40 mg by mouth daily.   Yes [provider]  Melatonin 3 MG TABS Take 3 mg by mouth at bedtime.   Yes [provider]  metolazone (ZAROXOLYN) 2.5 MG tablet Take 2.5 mg by mouth daily.   Yes [provider]  metoprolol succinate (TOPROL-XL) 25 MG 24 hr tablet Take 75 mg by mouth daily. Take with or immediately following a meal.    Yes [provider]  Multiple Vitamin (MULTIVITAMIN WITH MINERALS) TABS tablet Take 1 tablet by mouth daily.   Yes [provider]  polyethylene glycol (MIRALAX / GLYCOLAX) 17 g packet Take 17 g by mouth daily.   Yes [provider]  potassium chloride SA (K-DUR) 20 MEQ tablet Take 20 mEq by mouth 2 (two) times daily.   Yes [provider]  predniSONE (DELTASONE) 5 MG tablet Take 5 mg by mouth daily with breakfast.   Yes [provider]  senna (SENOKOT) 8.6 MG TABS tablet Take 8.6 mg by mouth at bedtime.   Yes [provider]  simvastatin (ZOCOR) 20 MG tablet Take 20 mg by mouth at bedtime.    Yes [provider]  warfarin (COUMADIN) 2 MG tablet Take 4 mg by mouth at bedtime.    Yes [provider]      VITAL SIGNS:  Blood pressure 112/75, pulse 98, temperature 98.1 F (36.7 C), temperature source Oral, resp. rate (!) 26, height 5\' 6"  (1.676 m), weight 63.5 kg, SpO2 91 %.  PHYSICAL EXAMINATION:   Physical Exam  GENERAL:  85 y.o.-year-old patient lying  in the bed with no acute distress.  EYES: Pupils equal, round, reactive to light and accommodation. No scleral icterus. Extraocular muscles intact.  HEENT: Right temporal area bruising, normocephalic. Oropharynx and nasopharynx clear.  NECK:  Supple, no jugular venous distention. No thyroid enlargement, no tenderness.  LUNGS: Normal breath sounds bilaterally, no wheezing, rales,rhonchi or crepitation. No use of accessory muscles of respiration.  CARDIOVASCULAR: S1, S2 normal. No murmurs, rubs, or gallops.  ABDOMEN: Soft, nontender, nondistended. Bowel sounds present. No organomegaly or mass.  EXTREMITIES: Bilateral pitting edema of lower extremities.  Cyanosis, or clubbing.  NEUROLOGIC: Mental Status:Alert, oriented X 1, thought content appropriate.  Speech fluent without evidence of aphasia.  Able to follow simple commands without difficulty. Attention span and concentration seemed appropriate  Cranial Nerves: II: Discs flat bilaterally; Visual fields grossly normal, pupils equal, round, reactive to light and accommodation III,IV, VI: ptosis not present, extra-ocular motions intact bilaterally V,VII: smile symmetric, facial light  touch sensation intact VIII: hearing normal bilaterally IX,X: gag reflex present XI: bilateral shoulder shrug XII: midline tongue extension Muscle strength 5/5 on the left unable to test muscle strength on the left due to right hip fracture Tone and bulk:normal tone throughout; no atrophy noted Sensory: Pinprick and light touch decreased bilaterally Deep Tendon Reflexes: 2+ and symmetric throughout Cerebellar:Absent ataxia Gait: not tested due to safety concerns PSYCHIATRIC: The patient is alert and oriented x 1.  SKIN: Multiple bruising in upper and lower extremities, right temporal bruising from previous fall  DATA REVIEWED:  LABORATORY PANEL:   CBC Recent Labs  Lab 11/15/18 1312  WBC 15.6*  HGB 12.0  HCT 40.5  PLT 483*    ------------------------------------------------------------------------------------------------------------------  Chemistries  Recent Labs  Lab 11/15/18 1312  NA 134*  K 4.7  CL 93*  CO2 29  GLUCOSE 129*  BUN 36*  CREATININE 0.94  CALCIUM 9.4  AST 25  ALT 17  ALKPHOS 80  BILITOT 1.3*   ------------------------------------------------------------------------------------------------------------------  Cardiac Enzymes Recent Labs  Lab 11/15/18 1312  TROPONINI <0.03   ------------------------------------------------------------------------------------------------------------------  RADIOLOGY:  Dg Chest 1 View  Result Date: 11/15/2018 CLINICAL DATA:  Preoperative chest x-ray, history of hip fracture EXAM: CHEST  1 VIEW COMPARISON:  05/29/2018 FINDINGS: Cardiac shadow is mildly enlarged in size. Aortic calcifications are seen. Skin folds are noted over the right lung apex. Patchy atelectatic changes are noted in the right base. Scarring is noted within the left lung stable from the prior study. Degenerative changes of the thoracic spine are seen. No acute bony abnormality is noted. IMPRESSION: Right basilar atelectasis. Right basilar atelectasis as described. Electronically Signed   By: Inez Catalina M.D.   On: 11/15/2018 14:32   Ct Head Wo Contrast  Result Date: 11/15/2018 CLINICAL DATA:  Head trauma secondary to a fall on 11/07/2018. Headache. Bruising at the right temple. EXAM: CT HEAD WITHOUT CONTRAST TECHNIQUE: Contiguous axial images were obtained from the base of the skull through the vertex without intravenous contrast. COMPARISON:  Report of brain MRI dated 12/29/2001 FINDINGS: Brain: There is no acute intracranial hemorrhage. There is diffuse cerebral cortical atrophy with scattered periventricular white matter lucency consistent with small vessel ischemic disease. Secondary slight dilatation of ventricular system in proportion to the degree of atrophy. Vascular: No  hyperdense vessel or unexpected calcification. Skull: Normal. Negative for fracture or focal lesion. Sinuses/Orbits: No significant abnormality. Tiny air-fluid levels in the sphenoid sinus. Other: None IMPRESSION: 1. No acute intracranial abnormality. 2. Diffuse mild atrophy. 3. Scattered periventricular white matter disease most likely small vessel ischemic changes. Electronically Signed   By: Lorriane Shire M.D.   On: 11/15/2018 16:00   Dg Hip Unilat W Or Wo Pelvis 2-3 Views Right  Result Date: 11/15/2018 CLINICAL DATA:  Right hip pain since a fall 11/04/2018. Initial encounter. EXAM: DG HIP (WITH OR WITHOUT PELVIS) 2-3V RIGHT COMPARISON:  None. FINDINGS: The patient has an acute subcapital fracture of the right hip. No other acute bony or joint abnormality is identified. Lower lumbar degenerative disease is seen. Atherosclerosis noted. IMPRESSION: Acute subcapital fracture right hip. Electronically Signed   By: Inge Rise M.D.   On: 11/15/2018 14:31    EKG:  EKG: there are no previous tracings available for comparison.  IMPRESSION AND PLAN:   85 y.o. female with a known history of atrial fibrillation on Coumadin, hypertension, recurrent falls with resultant fracture to right fibula and ankle status post ORIF, breast cancer status post mastectomy, osteomyelitis of right  ankle status post debridement and removal of hardware presenting with complaints of right hip pain status post fall.  1.  Acute subcapital right hip fracture -most likely due to recent fall sustained on 11/03/2018.  Unclear cause of fall however patient reports feeling of numbness in her bilateral lower extremity. - X-ray of right hip and pelvis reviewed and shows acute subcapital fracture of the right hip - Orthopedic consulted - Plan for surgery tomorrow - Keep patient n.p.o. after midnight - We will gently hydrate overnight - PRN pain medication  2. Recurrent falls -unclear if due to deconditioning, underlying  infectious process or ischemic event in a patient with history of atrial fibrillation. - UA pending - CT head reviewed and shows no acute intracranial abnormality - Chest x-ray reviewed shows right basilar atelectasis, she was previously treated for pneumonia and is just finished a course of doxycycline - WBCs trending down - Patient currently in long-term care facility at [facility] - We will obtain PT/OT following surgery as above  3.  Chronic atrial fibrillation -rate controlled on metoprolol - Holding Coumadin for possible surgery tomorrow - Check PT/INR  4.  Hypertension -stable on current medication  5. Anxiety/depression. Continue citalopram daily and melatonin nightly.  6.  DVT prophylaxis -holding anticoagulation for surgery tomorrow   All the records are reviewed and case discussed with ED provider. Management plans discussed with the patient, family and they are in agreement.  CODE STATUS: FULL  TOTAL TIME TAKING CARE OF THIS PATIENT: 50 minutes.    on 11/15/2018 at 5:09 PM  This patient was staffed with Dr. Gladstone Lighter who personally evaluated patient, reviewed documentation and agreed with assessment and plan of care as above.  Rufina Falco, DNP, FNP-BC [facility] Nurse Practitioner Between 7am to 6pm - Pager [phone]  After 6pm go to [email or web address] - password [email or web address] [facility]  Office  [phone]  CC: Primary care physician; Harlow Ohms, MD

## 2018-11-15 NOTE — ED Notes (Signed)
ED TO INPATIENT HANDOFF REPORT  ED Nurse Name and Phone #: Panayiota Larkin [phone]  S Name/Age/Gender Judith Keller 85 y.o. female Room/Bed: ED12A/ED12A  Code Status   Code Status: Full Code  Home/SNF/Other Rehab Patient oriented to: self, place and situation Is this baseline? Yes   Triage Complete: Triage complete  Chief Complaint hip pain ems  Triage Note Pt had a fall April 24 or 25 and had an xray yesterday and it showed a possible right hip fracture- pt states she has 5/10 pain at rest and 10/10 with exertion- no other complaints at this time   Allergies No Known Allergies  Level of Care/Admitting Diagnosis ED Disposition    ED Disposition Condition [place]: [place] [[id number]]  Level of Care: Med-Surg [16]  Covid Evaluation: Screening Protocol (No Symptoms)  Diagnosis: S/P right hip fracture [[id number]]  Admitting Physician: Eula Flax  Attending Physician: Gladstone Lighter [[id number]]  Estimated length of stay: past midnight tomorrow  Certification:: I certify this patient will need inpatient services for at least 2 midnights  PT Class (Do Not Modify): Inpatient [101]  PT Acc Code (Do Not Modify): Private [1]       B Medical/Surgery History Past Medical History:  Diagnosis Date  . Atrial fibrillation ([place])   . Heart failure ([place])   . Hyperlipidemia   . Hypertension   . Hypokalemia   . Osteoarthritis    Past Surgical History:  Procedure Laterality Date  . FOOT SURGERY    . MASTECTOMY       A IV Location/Drains/Wounds Patient Lines/Drains/Airways Status   Active Line/Drains/Airways    Name:   Placement date:   Placement time:   Site:   Days:   Peripheral IV 05/29/18 Left Wrist   05/29/18    0757    Wrist   170   Peripheral IV 05/29/18 Left Forearm   05/29/18    0822    Forearm   170   Peripheral IV 11/15/18 Left Antecubital   11/15/18    1314    Antecubital   less than 1           Intake/Output Last 24 hours No intake or output data in the 24 hours ending 11/15/18 1550  Labs/Imaging Results for orders placed or performed during the hospital encounter of 11/15/18 (from the past 48 hour(s))  CBC with Differential     Status: Abnormal   Collection Time: 11/15/18  1:12 PM  Result Value Ref Range   WBC 15.6 (H) 4.0 - 10.5 K/uL   RBC 4.72 3.87 - 5.11 MIL/uL   Hemoglobin 12.0 12.0 - 15.0 g/dL   HCT 40.5 36.0 - 46.0 %   MCV 85.8 80.0 - 100.0 fL   MCH 25.4 (L) 26.0 - 34.0 pg   MCHC 29.6 (L) 30.0 - 36.0 g/dL   RDW 17.0 (H) 11.5 - 15.5 %   Platelets 483 (H) 150 - 400 K/uL   nRBC 0.0 0.0 - 0.2 %   Neutrophils Relative % 88 %   Neutro Abs 13.8 (H) 1.7 - 7.7 K/uL   Lymphocytes Relative 3 %   Lymphs Abs 0.5 (L) 0.7 - 4.0 K/uL   Monocytes Relative 7 %   Monocytes Absolute 1.1 (H) 0.1 - 1.0 K/uL   Eosinophils Relative 0 %   Eosinophils Absolute 0.0 0.0 - 0.5 K/uL   Basophils Relative 1 %   Basophils Absolute 0.1 0.0 - 0.1 K/uL  Immature Granulocytes 1 %   Abs Immature Granulocytes 0.08 (H) 0.00 - 0.07 K/uL    Comment: Performed at [facility], [address] [place], [place] [address]  Comprehensive metabolic panel     Status: Abnormal   Collection Time: 11/15/18  1:12 PM  Result Value Ref Range   Sodium 134 (L) 135 - 145 mmol/L   Potassium 4.7 3.5 - 5.1 mmol/L   Chloride 93 (L) 98 - 111 mmol/L   CO2 29 22 - 32 mmol/L   Glucose, Bld 129 (H) 70 - 99 mg/dL   BUN 36 (H) 8 - 23 mg/dL   Creatinine, Ser 0.94 0.44 - 1.00 mg/dL   Calcium 9.4 8.9 - 10.3 mg/dL   Total Protein 7.5 6.5 - 8.1 g/dL   Albumin 3.5 3.5 - 5.0 g/dL   AST 25 15 - 41 U/L   ALT 17 0 - 44 U/L   Alkaline Phosphatase 80 38 - 126 U/L   Total Bilirubin 1.3 (H) 0.3 - 1.2 mg/dL   GFR calc non Af Amer 53 (L) >60 mL/min   GFR calc Af Amer >60 >60 mL/min   Anion gap 12 5 - 15    Comment: Performed at [facility], [address] [place], [place] [address]  Troponin I - ONCE -  STAT     Status: None   Collection Time: 11/15/18  1:12 PM  Result Value Ref Range   Troponin I <0.03 <0.03 ng/mL    Comment: Performed at [facility], [address] [place],  [address]  SARS Coronavirus 2 (CEPHEID - Performed in [facility] hospital lab), Hosp Order     Status: None   Collection Time: 11/15/18  1:12 PM  Result Value Ref Range   SARS Coronavirus 2 NEGATIVE NEGATIVE    Comment: (NOTE) If result is NEGATIVE SARS-CoV-2 target nucleic acids are NOT DETECTED. The SARS-CoV-2 RNA is generally detectable in upper and lower  respiratory specimens during the acute phase of infection. The lowest  concentration of SARS-CoV-2 viral copies this assay can detect is 250  copies / mL. A negative result does not preclude SARS-CoV-2 infection  and should not be used as the sole basis for treatment or other  patient management decisions.  A negative result may occur with  improper specimen collection / handling, submission of specimen other  than nasopharyngeal swab, presence of viral mutation(s) within the  areas targeted by this assay, and inadequate number of viral copies  (<250 copies / mL). A negative result must be combined with clinical  observations, patient history, and epidemiological information. If result is POSITIVE SARS-CoV-2 target nucleic acids are DETECTED. The SARS-CoV-2 RNA is generally detectable in upper and lower  respiratory specimens dur ing the acute phase of infection.  Positive  results are indicative of active infection with SARS-CoV-2.  Clinical  correlation with patient history and other diagnostic information is  necessary to determine patient infection status.  Positive results do  not rule out bacterial infection or co-infection with other viruses. If result is PRESUMPTIVE POSTIVE SARS-CoV-2 nucleic acids MAY BE PRESENT.   A presumptive positive result was obtained on the submitted specimen  and confirmed on repeat testing.  While  2019 novel coronavirus  (SARS-CoV-2) nucleic acids may be present in the submitted sample  additional confirmatory testing may be necessary for epidemiological  and / or clinical management purposes  to differentiate between  SARS-CoV-2 and other Sarbecovirus currently known to infect humans.  If clinically indicated additional testing with an alternate test  methodology [phone]) is advised. The SARS-CoV-2 RNA is generally  detectable in upper and lower respiratory sp ecimens during the acute  phase of infection. The expected result is Negative. Fact Sheet for Patients:  StrictlyIdeas.no Fact Sheet for Healthcare Providers: BankingDealers.co.za This test is not yet approved or cleared by the [place] FDA and has been authorized for detection and/or diagnosis of SARS-CoV-2 by FDA under an Emergency Use Authorization (EUA).  This EUA will remain in effect (meaning this test can be used) for the duration of the COVID-19 declaration under Section 564(b)(1) of the Act, 21 U.S.C. section 360bbb-3(b)(1), unless the authorization is terminated or revoked sooner. Performed at [facility], [address] [place], [place] [address]   Glucose, capillary     Status: Abnormal   Collection Time: 11/15/18  1:17 PM  Result Value Ref Range   Glucose-Capillary 112 (H) 70 - 99 mg/dL   Dg Chest 1 View  Result Date: 11/15/2018 CLINICAL DATA:  Preoperative chest x-ray, history of hip fracture EXAM: CHEST  1 VIEW COMPARISON:  05/29/2018 FINDINGS: Cardiac shadow is mildly enlarged in size. Aortic calcifications are seen. Skin folds are noted over the right lung apex. Patchy atelectatic changes are noted in the right base. Scarring is noted within the left lung stable from the prior study. Degenerative changes of the thoracic spine are seen. No acute bony abnormality is noted. IMPRESSION: Right basilar atelectasis. Right basilar atelectasis as  described. Electronically Signed   By: Inez Catalina M.D.   On: 11/15/2018 14:32   Dg Hip Unilat W Or Wo Pelvis 2-3 Views Right  Result Date: 11/15/2018 CLINICAL DATA:  Right hip pain since a fall 11/04/2018. Initial encounter. EXAM: DG HIP (WITH OR WITHOUT PELVIS) 2-3V RIGHT COMPARISON:  None. FINDINGS: The patient has an acute subcapital fracture of the right hip. No other acute bony or joint abnormality is identified. Lower lumbar degenerative disease is seen. Atherosclerosis noted. IMPRESSION: Acute subcapital fracture right hip. Electronically Signed   By: Inge Rise M.D.   On: 11/15/2018 14:31    Pending Labs Unresulted Labs (From admission, onward)    Start     Ordered   11/15/18 1549  Urinalysis, Complete w Microscopic  Once,   STAT     11/15/18 1548   11/15/18 1527  Protime-INR  ONCE - STAT,   STAT     11/15/18 1527          Vitals/Pain Today's Vitals   11/15/18 1304 11/15/18 1310 11/15/18 1430  BP:  124/67 112/75  Pulse:  (!) 118 98  Resp:  (!) 26 (!) 26  Temp:  98.1 F (36.7 C)   TempSrc:  Oral   SpO2:  95% 91%  Weight: 63.5 kg    Height: 5\' 6"  (1.676 m)    PainSc: 5       Isolation Precautions No active isolations  Medications Medications  acetaminophen (TYLENOL) tablet 650 mg (has no administration in time range)  diltiazem (DILACOR XR) 24 hr capsule 120 mg (has no administration in time range)  furosemide (LASIX) tablet 40 mg (has no administration in time range)  metolazone (ZAROXOLYN) tablet 2.5 mg (has no administration in time range)  metoprolol succinate (TOPROL-XL) 24 hr tablet 75 mg (has no administration in time range)  simvastatin (ZOCOR) tablet 20 mg (has no administration in time range)  citalopram (CELEXA) tablet 10 mg (has no administration in time range)  predniSONE (DELTASONE)  tablet 5 mg (has no administration in time range)  polyethylene glycol (MIRALAX / GLYCOLAX) packet 17 g (has no administration in time range)  senna (SENOKOT)  tablet 8.6 mg (has no administration in time range)  Melatonin TABS 3 mg (has no administration in time range)  cholecalciferol (VITAMIN D) tablet 2,000 Units (has no administration in time range)  multivitamin with minerals tablet 1 tablet (has no administration in time range)  potassium chloride SA (K-DUR) CR tablet 20 mEq (has no administration in time range)  albuterol (VENTOLIN HFA) 108 (90 Base) MCG/ACT inhaler 1 puff (has no administration in time range)  fluticasone (FLONASE) 50 MCG/ACT nasal spray 2 spray (has no administration in time range)  morphine 2 MG/ML injection 0.5 mg (has no administration in time range)  0.9 %  sodium chloride infusion (has no administration in time range)  traMADol (ULTRAM) tablet 50 mg (has no administration in time range)    Mobility power wheelchair Moderate fall risk   Focused Assessments [place]: RLE shortening and rotation. Boot in place   R Recommendations: See Admitting Provider Note  Report given to:   Additional Notes:

## 2018-11-15 NOTE — ED Provider Notes (Signed)
[facility] Emergency Department Provider Note       Time seen: ----------------------------------------- 1:01 PM on 11/15/2018 -----------------------------------------   I have reviewed the triage vital signs and the nursing notes.  HISTORY   Chief Complaint Hip Pain    HPI Judith Keller is a 85 y.o. female with a history of hyperlipidemia who presents to the ED for a recent fall and outpatient imaging concerning for right hip fracture.  Patient is complaining of right hip pain.  No further information is available.  Past Medical History:  Diagnosis Date  . Hyperlipidemia     There are no active problems to display for this patient.   No past surgical history on file.  Allergies Patient has no allergy information on record.  Social History Social History   Tobacco Use  . Smoking status: Not on file  Substance Use Topics  . Alcohol use: Not on file  . Drug use: Not on file   Review of Systems Constitutional: Negative for fever. Cardiovascular: Negative for chest pain. Respiratory: Negative for shortness of breath. Gastrointestinal: Negative for abdominal pain, vomiting and diarrhea. Musculoskeletal: Positive for right leg pain Skin: Negative for rash. Neurological: Negative for headaches, focal weakness or numbness.  All systems negative/normal/unremarkable except as stated in the HPI  ____________________________________________   PHYSICAL EXAM:  VITAL SIGNS: ED Triage Vitals  Enc Vitals Group     BP      Pulse      Resp      Temp      Temp src      SpO2      Weight      Height      Head Circumference      Peak Flow      Pain Score      Pain Loc      Pain Edu?      Excl. in [place]?    Constitutional: Alert and oriented. Well appearing and in no distress. Eyes: Conjunctivae are normal. Normal extraocular movements. Cardiovascular: Normal rate, regular rhythm. No murmurs, rubs, or gallops. Respiratory: Normal  respiratory effort without tachypnea nor retractions. Breath sounds are clear and equal bilaterally. No wheezes/rales/rhonchi. Gastrointestinal: Soft and nontender. Normal bowel sounds Musculoskeletal: Pain with range of motion of the right lower extremity, she is wearing a walking boot below the right knee.  Pain seems to be elicited with hip rotation. Neurologic:  Normal speech and language. No gross focal neurologic deficits are appreciated.  Skin:  Skin is warm, dry and intact. No rash noted. Psychiatric: Mood and affect are normal. Speech and behavior are normal.  ____________________________________________  ED COURSE:  As part of my medical decision making, I reviewed the following data within the [place] History obtained from family if available, nursing notes, old chart and ekg, as well as notes from prior ED visits. Patient presented for right hip pain after recent fall, we will assess with labs and imaging as indicated at this time.   Procedures  PRETTY WELTMAN was evaluated in Emergency Department on 11/15/2018 for the symptoms described in the history of present illness. She was evaluated in the context of the global COVID-19 pandemic, which necessitated consideration that the patient might be at risk for infection with the SARS-CoV-2 virus that causes COVID-19. Institutional protocols and algorithms that pertain to the evaluation of patients at risk for COVID-19 are in a state of rapid change based on information released by regulatory bodies including the  CDC and federal and [employer]. These policies and algorithms were followed during the patient's care in the ED.  ____________________________________________   LABS (pertinent positives/negatives)  Labs Reviewed  CBC WITH DIFFERENTIAL/PLATELET - Abnormal; Notable for the following components:      Result Value   WBC 15.6 (*)    MCH 25.4 (*)    MCHC 29.6 (*)    RDW 17.0 (*)    Platelets 483 (*)     Neutro Abs 13.8 (*)    Lymphs Abs 0.5 (*)    Monocytes Absolute 1.1 (*)    Abs Immature Granulocytes 0.08 (*)    All other components within normal limits  COMPREHENSIVE METABOLIC PANEL - Abnormal; Notable for the following components:   Sodium 134 (*)    Chloride 93 (*)    Glucose, Bld 129 (*)    BUN 36 (*)    Total Bilirubin 1.3 (*)    GFR calc non Af Amer 53 (*)    All other components within normal limits  GLUCOSE, CAPILLARY - Abnormal; Notable for the following components:   Glucose-Capillary 112 (*)    All other components within normal limits  SARS CORONAVIRUS 2 (HOSPITAL ORDER, [facility])  TROPONIN I  CBG MONITORING, ED    RADIOLOGY Images were viewed by me  Right hip x-rays IMPRESSION: Right basilar atelectasis.  Right basilar atelectasis as described. IMPRESSION: Acute subcapital fracture right hip. ____________________________________________   DIFFERENTIAL DIAGNOSIS   Hip fracture, contusion, dislocation, femur fracture  FINAL ASSESSMENT AND PLAN  Right hip fracture   Plan: The patient had presented for recent fall with suspected right hip fracture. Patient's labs did not reveal any acute process although her BUN is somewhat elevated. Patient's imaging did reveal a subcapital right hip fracture.  We have talked to orthopedics and I will discuss with the hospitalist for admission.   Laurence Aly, MD    Note: This note was generated in part or whole with voice recognition software. Voice recognition is usually quite accurate but there are transcription errors that can and very often do occur. I apologize for any typographical errors that were not detected and corrected.     Earleen Newport, MD 11/15/18 [id number]

## 2018-11-15 NOTE — Progress Notes (Signed)
   [facility] [facility] Day: 0 days Judith Keller is a 85 y.o. female presenting with Hip Pain .   Advance care planning discussed with patient and her son Romonda Parker. All questions in regards to overall condition and expected prognosis answered.  Acute diagnosis- right hip fracture Chronic diagnosis- Afib on coumadin, HTN, arthirits, hyperlipidemia  Son has mentioned that patient is a DNR and would like to continue current CODE STATUS.  CODE STATUS: DNR Time spent: 18 minutes

## 2018-11-15 NOTE — ED Triage Notes (Signed)
Pt had a fall April 24 or 25 and had an xray yesterday and it showed a possible right hip fracture- pt states she has 5/10 pain at rest and 10/10 with exertion- no other complaints at this time

## 2018-11-15 NOTE — ED Notes (Signed)
PT gave permission to update son

## 2018-11-16 ENCOUNTER — Inpatient Hospital Stay: Payer: Medicare Other | Admitting: Anesthesiology

## 2018-11-16 ENCOUNTER — Encounter: Payer: Self-pay | Admitting: Anesthesiology

## 2018-11-16 ENCOUNTER — Inpatient Hospital Stay: Payer: Medicare Other

## 2018-11-16 ENCOUNTER — Encounter
Admission: EM | Disposition: A | Discharge status: Skilled Nursing Facility | Payer: Self-pay | Source: Home / Self Care | Attending: Internal Medicine

## 2018-11-16 HISTORY — PX: HIP ARTHROPLASTY: SHX981

## 2018-11-16 LAB — PROTIME-INR
INR: 1.4 — ABNORMAL HIGH (ref 0.8–1.2)
Prothrombin Time: 16.9 seconds — ABNORMAL HIGH (ref 11.4–15.2)

## 2018-11-16 SURGERY — HEMIARTHROPLASTY, HIP, DIRECT ANTERIOR APPROACH, FOR FRACTURE
Anesthesia: General | Laterality: Right

## 2018-11-16 MED ORDER — DEXAMETHASONE SODIUM PHOSPHATE 10 MG/ML IJ SOLN
INTRAMUSCULAR | Status: DC | PRN
  Administered 2018-11-16: 5 mg via INTRAVENOUS

## 2018-11-16 MED ORDER — DOCUSATE SODIUM 100 MG PO CAPS
100.0000 mg | ORAL_CAPSULE | Freq: Two times a day (BID) | ORAL | Status: DC
  Administered 2018-11-16 – 2018-11-18 (×4): 100 mg via ORAL
  Filled 2018-11-16 (×4): qty 1

## 2018-11-16 MED ORDER — ONDANSETRON HCL 4 MG/2ML IJ SOLN: 4.0000 mg | Freq: Once | INTRAMUSCULAR | Status: DC | PRN

## 2018-11-16 MED ORDER — ONDANSETRON HCL 4 MG/2ML IJ SOLN
INTRAMUSCULAR | Status: DC | PRN
  Administered 2018-11-16: 4 mg via INTRAVENOUS

## 2018-11-16 MED ORDER — KETAMINE HCL 50 MG/ML IJ SOLN
INTRAMUSCULAR | Status: DC | PRN
  Administered 2018-11-16: 15 mg via INTRAMUSCULAR

## 2018-11-16 MED ORDER — MENTHOL 3 MG MT LOZG
1.0000 | LOZENGE | OROMUCOSAL | Status: DC | PRN
  Filled 2018-11-16: qty 9

## 2018-11-16 MED ORDER — FENTANYL CITRATE (PF) 100 MCG/2ML IJ SOLN: 25.0000 ug | INTRAMUSCULAR | Status: DC | PRN

## 2018-11-16 MED ORDER — METOCLOPRAMIDE HCL 5 MG PO TABS: 5.0000 mg | ORAL_TABLET | Freq: Three times a day (TID) | ORAL | Status: DC | PRN

## 2018-11-16 MED ORDER — CEFAZOLIN SODIUM-DEXTROSE 1-4 GM/50ML-% IV SOLN
1.0000 g | Freq: Four times a day (QID) | INTRAVENOUS | Status: AC
  Administered 2018-11-16 – 2018-11-17 (×2): 1 g via INTRAVENOUS
  Filled 2018-11-16 (×2): qty 50

## 2018-11-16 MED ORDER — ONDANSETRON HCL 4 MG/2ML IJ SOLN
INTRAMUSCULAR | Status: AC
  Filled 2018-11-16: qty 2

## 2018-11-16 MED ORDER — PHENYLEPHRINE HCL (PRESSORS) 10 MG/ML IV SOLN
INTRAVENOUS | Status: DC | PRN
  Administered 2018-11-16: 200 ug via INTRAVENOUS
  Administered 2018-11-16: 100 ug via INTRAVENOUS
  Administered 2018-11-16: 200 ug via INTRAVENOUS

## 2018-11-16 MED ORDER — SODIUM CHLORIDE 0.9 % IR SOLN
Status: DC | PRN
  Administered 2018-11-16 (×2)

## 2018-11-16 MED ORDER — METOCLOPRAMIDE HCL 5 MG/ML IJ SOLN: 5.0000 mg | Freq: Three times a day (TID) | INTRAMUSCULAR | Status: DC | PRN

## 2018-11-16 MED ORDER — ROCURONIUM BROMIDE 100 MG/10ML IV SOLN
INTRAVENOUS | Status: DC | PRN
  Administered 2018-11-16: 10 mg via INTRAVENOUS
  Administered 2018-11-16: 20 mg via INTRAVENOUS

## 2018-11-16 MED ORDER — ONDANSETRON HCL 4 MG PO TABS: 4.0000 mg | ORAL_TABLET | Freq: Four times a day (QID) | ORAL | Status: DC | PRN

## 2018-11-16 MED ORDER — LIDOCAINE HCL (CARDIAC) PF 100 MG/5ML IV SOSY
PREFILLED_SYRINGE | INTRAVENOUS | Status: DC | PRN
  Administered 2018-11-16: 100 mg via INTRAVENOUS

## 2018-11-16 MED ORDER — ONDANSETRON HCL 4 MG/2ML IJ SOLN: 4.0000 mg | Freq: Four times a day (QID) | INTRAMUSCULAR | Status: DC | PRN

## 2018-11-16 MED ORDER — ROCURONIUM BROMIDE 50 MG/5ML IV SOLN
INTRAVENOUS | Status: AC
  Filled 2018-11-16: qty 1

## 2018-11-16 MED ORDER — LACTATED RINGERS IV SOLN
INTRAVENOUS | Status: DC
  Administered 2018-11-16: 18:00:00 via INTRAVENOUS

## 2018-11-16 MED ORDER — FENTANYL CITRATE (PF) 100 MCG/2ML IJ SOLN
INTRAMUSCULAR | Status: DC | PRN
  Administered 2018-11-16: 50 ug via INTRAVENOUS

## 2018-11-16 MED ORDER — LIDOCAINE HCL (PF) 2 % IJ SOLN
INTRAMUSCULAR | Status: AC
  Filled 2018-11-16: qty 10

## 2018-11-16 MED ORDER — PROPOFOL 10 MG/ML IV BOLUS
INTRAVENOUS | Status: AC
  Filled 2018-11-16: qty 20

## 2018-11-16 MED ORDER — PHENOL 1.4 % MT LIQD
1.0000 | OROMUCOSAL | Status: DC | PRN
  Filled 2018-11-16: qty 177

## 2018-11-16 MED ORDER — SUGAMMADEX SODIUM 200 MG/2ML IV SOLN
INTRAVENOUS | Status: DC | PRN
  Administered 2018-11-16: 130 mg via INTRAVENOUS

## 2018-11-16 MED ORDER — METOPROLOL TARTRATE 5 MG/5ML IV SOLN
INTRAVENOUS | Status: DC | PRN
  Administered 2018-11-16 (×2): 2 mg via INTRAVENOUS

## 2018-11-16 MED ORDER — BUPIVACAINE-EPINEPHRINE (PF) 0.25% -1:200000 IJ SOLN
INTRAMUSCULAR | Status: DC | PRN
  Administered 2018-11-16: 20 mL

## 2018-11-16 MED ORDER — FENTANYL CITRATE (PF) 100 MCG/2ML IJ SOLN
INTRAMUSCULAR | Status: AC
  Filled 2018-11-16: qty 2

## 2018-11-16 MED ORDER — SUCCINYLCHOLINE CHLORIDE 20 MG/ML IJ SOLN
INTRAMUSCULAR | Status: DC | PRN
  Administered 2018-11-16: 80 mg via INTRAVENOUS

## 2018-11-16 MED ORDER — SUCCINYLCHOLINE CHLORIDE 20 MG/ML IJ SOLN
INTRAMUSCULAR | Status: AC
  Filled 2018-11-16: qty 1

## 2018-11-16 MED ORDER — DEXAMETHASONE SODIUM PHOSPHATE 10 MG/ML IJ SOLN
INTRAMUSCULAR | Status: AC
  Filled 2018-11-16: qty 1

## 2018-11-16 MED ORDER — PROPOFOL 10 MG/ML IV BOLUS
INTRAVENOUS | Status: DC | PRN
  Administered 2018-11-16: 70 mg via INTRAVENOUS

## 2018-11-16 MED ORDER — KETAMINE HCL 50 MG/ML IJ SOLN
INTRAMUSCULAR | Status: AC
  Filled 2018-11-16: qty 10

## 2018-11-16 MED ORDER — METOPROLOL TARTRATE 5 MG/5ML IV SOLN
INTRAVENOUS | Status: AC
  Filled 2018-11-16: qty 5

## 2018-11-16 MED ORDER — ASPIRIN 81 MG PO CHEW
81.0000 mg | CHEWABLE_TABLET | Freq: Two times a day (BID) | ORAL | Status: DC
  Administered 2018-11-16 – 2018-11-18 (×4): 81 mg via ORAL
  Filled 2018-11-16 (×4): qty 1

## 2018-11-16 SURGICAL SUPPLY — 52 items
BLADE SAGITTAL WIDE XTHICK NO (BLADE) ×3 IMPLANT
BLADE SAW SAG 25X90X1.19 (BLADE) ×3 IMPLANT
BNDG COHESIVE 4X5 TAN STRL (GAUZE/BANDAGES/DRESSINGS) ×6 IMPLANT
BRUSH SCRUB EZ  4% CHG (MISCELLANEOUS) ×4
BRUSH SCRUB EZ 4% CHG (MISCELLANEOUS) ×2 IMPLANT
CHLORAPREP W/TINT 26 (MISCELLANEOUS) ×3 IMPLANT
COVER WAND RF STERILE (DRAPES) ×3 IMPLANT
DRAPE C-ARM 42X72 X-RAY (DRAPES) ×3 IMPLANT
DRAPE SHEET LG 3/4 BI-LAMINATE (DRAPES) ×9 IMPLANT
DRAPE STERI IOBAN 125X83 (DRAPES) ×3 IMPLANT
DRSG AQUACEL AG ADV 3.5X10 (GAUZE/BANDAGES/DRESSINGS) ×3 IMPLANT
DRSG AQUACEL AG ADV 3.5X14 (GAUZE/BANDAGES/DRESSINGS) IMPLANT
ELECT BLADE 6.5 EXT (BLADE) ×3 IMPLANT
ELECT REM PT RETURN 9FT ADLT (ELECTROSURGICAL) ×3
ELECTRODE REM PT RTRN 9FT ADLT (ELECTROSURGICAL) ×1 IMPLANT
FEM HEAD COCR 28MM 3 (Orthopedic Implant) ×3 IMPLANT
FEMORAL HEAD COCR 28MM 3 (Orthopedic Implant) ×1 IMPLANT
GAUZE XEROFORM 1X8 LF (GAUZE/BANDAGES/DRESSINGS) ×3 IMPLANT
GLOVE INDICATOR 8.0 STRL GRN (GLOVE) ×6 IMPLANT
GLOVE SURG ORTHO 8.0 STRL STRW (GLOVE) ×6 IMPLANT
GOWN STRL REUS W/ TWL LRG LVL3 (GOWN DISPOSABLE) ×1 IMPLANT
GOWN STRL REUS W/ TWL XL LVL3 (GOWN DISPOSABLE) ×1 IMPLANT
GOWN STRL REUS W/TWL LRG LVL3 (GOWN DISPOSABLE) ×2
GOWN STRL REUS W/TWL XL LVL3 (GOWN DISPOSABLE) ×2
HANDLE YANKAUER SUCT BULB TIP (MISCELLANEOUS) ×3 IMPLANT
HEAD BIPOLAR 44MM (Hips) ×3 IMPLANT
HOOD PEEL AWAY FLYTE STAYCOOL (MISCELLANEOUS) IMPLANT
IV NS 1000ML (IV SOLUTION) ×2
IV NS 1000ML BAXH (IV SOLUTION) ×1 IMPLANT
KIT PATIENT CARE HANA TABLE (KITS) ×3 IMPLANT
KIT TURNOVER CYSTO (KITS) ×3 IMPLANT
MAT ABSORB  FLUID 56X50 GRAY (MISCELLANEOUS) ×2
MAT ABSORB FLUID 56X50 GRAY (MISCELLANEOUS) ×1 IMPLANT
NDL SAFETY ECLIPSE 18X1.5 (NEEDLE) ×2 IMPLANT
NEEDLE HYPO 18GX1.5 SHARP (NEEDLE) ×4
NEEDLE HYPO 22GX1.5 SAFETY (NEEDLE) ×3 IMPLANT
NEEDLE SPNL 20GX3.5 QUINCKE YW (NEEDLE) ×3 IMPLANT
PACK HIP PROSTHESIS (MISCELLANEOUS) ×3 IMPLANT
PADDING CAST BLEND 4X4 NS (MISCELLANEOUS) ×6 IMPLANT
PILLOW ABDUCTION MEDIUM (MISCELLANEOUS) ×3 IMPLANT
PULSAVAC PLUS IRRIG FAN TIP (DISPOSABLE) ×3
STAPLER SKIN PROX 35W (STAPLE) ×3 IMPLANT
STEM STD COLLAR SZ3 POLARSTEM (Stem) ×3 IMPLANT
SUT BONE WAX W31G (SUTURE) IMPLANT
SUT DVC 2 QUILL PDO  T11 36X36 (SUTURE) ×2
SUT DVC 2 QUILL PDO T11 36X36 (SUTURE) ×1 IMPLANT
SUT QUILL PDO 2 24X24 VLT (SUTURE) ×3 IMPLANT
SUT VIC AB 2-0 CT1 18 (SUTURE) ×3 IMPLANT
SUT VIC AB 2-0 CT1 27 (SUTURE) ×2
SUT VIC AB 2-0 CT1 TAPERPNT 27 (SUTURE) ×1 IMPLANT
SYR 20CC LL (SYRINGE) ×3 IMPLANT
TIP FAN IRRIG PULSAVAC PLUS (DISPOSABLE) ×1 IMPLANT

## 2018-11-16 NOTE — Anesthesia Post-op Follow-up Note (Signed)
Anesthesia QCDR form completed.        

## 2018-11-16 NOTE — Anesthesia Preprocedure Evaluation (Signed)
Anesthesia Evaluation  Patient identified by MRN, date of birth, ID band Patient awake    Reviewed: Allergy & Precautions, NPO status , Patient's Chart, lab work & pertinent test results, reviewed documented beta blocker date and time   Airway Mallampati: II  TM Distance: >3 FB     Dental  (+) Chipped   Pulmonary           Cardiovascular hypertension, Pt. on medications and Pt. on home beta blockers      Neuro/Psych    GI/Hepatic   Endo/Other    Renal/GU      Musculoskeletal  (+) Arthritis ,   Abdominal   Peds  Hematology   Anesthesia Other Findings Low sats 92-94%. On cooumadin. CXR shows atelectasis. She has a hx of PVCs.   Reproductive/Obstetrics                             Anesthesia Physical Anesthesia Plan  ASA: III  Anesthesia Plan: General   Post-op Pain Management:    Induction: Intravenous  PONV Risk Score and Plan:   Airway Management Planned: Oral ETT  Additional Equipment:   Intra-op Plan:   Post-operative Plan:   Informed Consent: I have reviewed the patients History and Physical, chart, labs and discussed the procedure including the risks, benefits and alternatives for the proposed anesthesia with the patient or authorized representative who has indicated his/her understanding and acceptance.       Plan Discussed with: CRNA  Anesthesia Plan Comments:         Anesthesia Quick Evaluation

## 2018-11-16 NOTE — Op Note (Signed)
11/16/2018  3:32 PM  PATIENT:  Judith Keller   MRN: [id number]  PRE-OPERATIVE DIAGNOSIS:  Displaced Subcapital fracture right hip   POST-OPERATIVE DIAGNOSIS: Same  Procedure: Right Hip Anterior Hip Hemiarthroplasty   Surgeon: Elyn Aquas. Harlow Mares, MD   Anesthesia: Spinal   EBL: 100 mL   Specimens: None   Drains: None   Components used: A size 3 Polarstem [employer], a 44 mm bipolar head    Description of the procedure in detail: After informed consent was obtained and the appropriate extremity marked in the pre-operative holding area, the patient was taken to the operating room and placed in the supine position on the fracture table. All pressure points were well padded and bilateral lower extremities were place in traction spars. The hip was prepped and draped in standard sterile fashion. A spinal anesthetic had been delivered by the anesthesia team. The skin and subcutaneous tissues were injected with a mixture of Marcaine with epinephrine for post-operative pain. A longitudinal incision approximately 10 cm in length was carried out from the anterior superior iliac spine to the greater trochanter. The tensor fascia was divided and blunt dissection was taken down to the level of the joint capsule. The lateral circumflex vessels were cauterized. Deep retractors were placed and a portion of the anterior capsule was excised. Using fluoroscopy the neck cut was planned and carried out with a sagittal saw. The head was passed from the field with use of a corkscrew and hip skid. Deep retractors were placed along the acetabulum and bony and soft tissue debris was removed.   Attention was then turned to the proximal femur. The leg was placed in extension and external rotation. The canal was opened and sequentially broached to a size 3. The trial components were placed and the hip relocated. The components were found to be in good position using fluoroscopy. The hip was dislocated and the trial  components removed. The final components were impacted in to position and the hip relocated. The final components were again check with fluoroscopy and found to be in good position. Hemostasis was achieved with electrocautery. The deep capsule was injected with Marcaine and epinephrine. The wound was irrigated with bacitracin laced normal saline and the tensor fascia closed with #2 Quill suture. The subcutaneous tissues were closed with 2-0 vicryl and staples for the skin. A sterile dressing was applied and an abduction pillow. Patient tolerated the procedure well and there were no apparent complication. Patient was taken to the recovery room in good condition.    Elyn Aquas. Harlow Mares, MD  11/16/2018 3:32 PM

## 2018-11-16 NOTE — Anesthesia Postprocedure Evaluation (Signed)
Anesthesia Post Note  Patient: Judith Keller  Procedure(s) Performed: ARTHROPLASTY BIPOLAR HIP (HEMIARTHROPLASTY) (Right )  Patient location during evaluation: PACU Anesthesia Type: General Level of consciousness: awake and alert Pain management: pain level controlled Vital Signs Assessment: post-procedure vital signs reviewed and stable Respiratory status: spontaneous breathing, nonlabored ventilation, respiratory function stable and patient connected to nasal cannula oxygen Cardiovascular status: blood pressure returned to baseline and stable Postop Assessment: no apparent nausea or vomiting Anesthetic complications: no     Last Vitals:  Vitals:   11/16/18 1700 11/16/18 1744  BP: 128/86 126/82  Pulse:  (!) 107  Resp:  20  Temp: 36.8 C 36.6 C  SpO2:  92%    Last Pain:  Vitals:   11/16/18 1745  TempSrc:   PainSc: 0-No pain                 Precious Haws [name]

## 2018-11-16 NOTE — Consult Note (Signed)
ORTHOPAEDIC CONSULTATION  REQUESTING PHYSICIAN: Saundra Shelling, MD  Chief Complaint: right hip pain  HPI: Judith Keller is a 85 y.o. female who complains of right hip pain. Please see H&P and ED notes for details. Denies any numbness, tingling or constitutional symptoms.  Past Medical History:  Diagnosis Date  . Atrial fibrillation ([place])   . Heart failure ([place])   . Hyperlipidemia   . Hypertension   . Hypokalemia   . Osteoarthritis    Past Surgical History:  Procedure Laterality Date  . FOOT SURGERY    . MASTECTOMY     Social History   Socioeconomic History  . Marital status: Widowed    Spouse name: Not on file  . Number of children: Not on file  . Years of education: Not on file  . Highest education level: Not on file  Occupational History  . Not on file  Social Needs  . Financial resource strain: Not on file  . Food insecurity:    Worry: Not on file    Inability: Not on file  . Transportation needs:    Medical: Not on file    Non-medical: Not on file  Tobacco Use  . Smoking status: Never Smoker  . Smokeless tobacco: Never Used  Substance and Sexual Activity  . Alcohol use: Not Currently  . Drug use: Never  . Sexual activity: Not on file  Lifestyle  . Physical activity:    Days per week: Not on file    Minutes per session: Not on file  . Stress: Not on file  Relationships  . Social connections:    Talks on phone: Not on file    Gets together: Not on file    Attends religious service: Not on file    Active member of club or organization: Not on file    Attends meetings of clubs or organizations: Not on file    Relationship status: Not on file  Other Topics Concern  . Not on file  Social History Narrative  . Not on file   History reviewed. No pertinent family history. No Known Allergies Prior to Admission medications   Medication Sig Start Date End Date Taking? Authorizing Provider  acetaminophen (TYLENOL) 325 MG tablet Take 650 mg by mouth every 6  (six) hours as needed for mild pain.   Yes [provider]  acetaminophen (TYLENOL) 650 MG CR tablet Take 650 mg by mouth 2 (two) times daily.   Yes [provider]  albuterol (VENTOLIN HFA) 108 (90 Base) MCG/ACT inhaler Inhale 1 puff into the lungs every 4 (four) hours as needed for wheezing or shortness of breath.   Yes [provider]  cholecalciferol (VITAMIN D) 25 MCG (1000 UT) tablet Take 2,000 Units by mouth daily.   Yes [provider]  citalopram (CELEXA) 10 MG tablet Take 10 mg by mouth daily.   Yes [provider]  diltiazem (DILACOR XR) 120 MG 24 hr capsule Take 120 mg by mouth daily.   Yes [provider]  fluticasone (FLONASE) 50 MCG/ACT nasal spray Place 2 sprays into both nostrils at bedtime.   Yes [provider]  furosemide (LASIX) 20 MG tablet Take 40 mg by mouth daily.   Yes [provider]  Melatonin 3 MG TABS Take 3 mg by mouth at bedtime.   Yes [provider]  metolazone (ZAROXOLYN) 2.5 MG tablet Take 2.5 mg by mouth daily.   Yes [provider]  metoprolol succinate (TOPROL-XL) 25 MG 24  hr tablet Take 75 mg by mouth daily. Take with or immediately following a meal.    Yes [provider]  Multiple Vitamin (MULTIVITAMIN WITH MINERALS) TABS tablet Take 1 tablet by mouth daily.   Yes [provider]  polyethylene glycol (MIRALAX / GLYCOLAX) 17 g packet Take 17 g by mouth daily.   Yes [provider]  potassium chloride SA (K-DUR) 20 MEQ tablet Take 20 mEq by mouth 2 (two) times daily.   Yes [provider]  predniSONE (DELTASONE) 5 MG tablet Take 5 mg by mouth daily with breakfast.   Yes [provider]  senna (SENOKOT) 8.6 MG TABS tablet Take 8.6 mg by mouth at bedtime.   Yes [provider]  simvastatin (ZOCOR) 20 MG tablet Take 20 mg by mouth at bedtime.    Yes [provider]  warfarin (COUMADIN) 2 MG tablet Take 4 mg by  mouth at bedtime.    Yes [provider]   Dg Chest 1 View  Result Date: 11/15/2018 CLINICAL DATA:  Preoperative chest x-ray, history of hip fracture EXAM: CHEST  1 VIEW COMPARISON:  05/29/2018 FINDINGS: Cardiac shadow is mildly enlarged in size. Aortic calcifications are seen. Skin folds are noted over the right lung apex. Patchy atelectatic changes are noted in the right base. Scarring is noted within the left lung stable from the prior study. Degenerative changes of the thoracic spine are seen. No acute bony abnormality is noted. IMPRESSION: Right basilar atelectasis. Right basilar atelectasis as described. Electronically Signed   By: Inez Catalina M.D.   On: 11/15/2018 14:32   Ct Head Wo Contrast  Result Date: 11/15/2018 CLINICAL DATA:  Head trauma secondary to a fall on 11/07/2018. Headache. Bruising at the right temple. EXAM: CT HEAD WITHOUT CONTRAST TECHNIQUE: Contiguous axial images were obtained from the base of the skull through the vertex without intravenous contrast. COMPARISON:  Report of brain MRI dated 12/29/2001 FINDINGS: Brain: There is no acute intracranial hemorrhage. There is diffuse cerebral cortical atrophy with scattered periventricular white matter lucency consistent with small vessel ischemic disease. Secondary slight dilatation of ventricular system in proportion to the degree of atrophy. Vascular: No hyperdense vessel or unexpected calcification. Skull: Normal. Negative for fracture or focal lesion. Sinuses/Orbits: No significant abnormality. Tiny air-fluid levels in the sphenoid sinus. Other: None IMPRESSION: 1. No acute intracranial abnormality. 2. Diffuse mild atrophy. 3. Scattered periventricular white matter disease most likely small vessel ischemic changes. Electronically Signed   By: Lorriane Shire M.D.   On: 11/15/2018 16:00   Dg Hip Unilat W Or Wo Pelvis 2-3 Views Right  Result Date: 11/15/2018 CLINICAL DATA:  Right hip pain since a fall 11/04/2018. Initial  encounter. EXAM: DG HIP (WITH OR WITHOUT PELVIS) 2-3V RIGHT COMPARISON:  None. FINDINGS: The patient has an acute subcapital fracture of the right hip. No other acute bony or joint abnormality is identified. Lower lumbar degenerative disease is seen. Atherosclerosis noted. IMPRESSION: Acute subcapital fracture right hip. Electronically Signed   By: Inge Rise M.D.   On: 11/15/2018 14:31    Positive ROS: All other systems have been reviewed and were otherwise negative with the exception of those mentioned in the HPI and as above.  Physical Exam: General: Alert, no acute distress Cardiovascular: No pedal edema Respiratory: No cyanosis, no use of accessory musculature GI: No organomegaly, abdomen is soft and non-tender Skin: No lesions in the area of chief complaint Neurologic: Sensation intact distally Psychiatric: Patient is competent for consent with  normal mood and affect Lymphatic: No axillary or cervical lymphadenopathy  MUSCULOSKELETAL: right leg short, externally rotated. Compartments soft. Good cap refill. Motor and sensory intact distally.  Assessment: Right femoral neck fracture, closed, displaced  Plan: Plan a right hip hemiarthroplasty today.   The diagnosis, risks, benefits and alternatives to treatment are all discussed in detail with the patient and family. Risks include but are not limited to bleeding, infection, deep vein thrombosis, pulmonary embolism, nerve or vascular injury, non-union, repeat operation, persistent pain, weakness, stiffness and death. She understands and is eager to proceed.     Lovell Sheehan, MD    11/16/2018 8:19 AM

## 2018-11-16 NOTE — Transfer of Care (Signed)
Immediate Anesthesia Transfer of Care Note  Patient: Judith Keller  Procedure(s) Performed: ARTHROPLASTY BIPOLAR HIP (HEMIARTHROPLASTY) (Right )  Patient Location: PACU  Anesthesia Type:General  Level of Consciousness: sedated  Airway & Oxygen Therapy: Patient Spontanous Breathing and Patient connected to face mask oxygen  Post-op Assessment: Report given to RN and Post -op Vital signs reviewed and stable  Post vital signs: Reviewed  Last Vitals:  Vitals Value Taken Time  BP 108/65 11/16/2018  3:38 PM  Temp    Pulse 97 11/16/2018  3:38 PM  Resp 23 11/16/2018  3:38 PM  SpO2 100 % 11/16/2018  3:38 PM  Vitals shown include unvalidated device data.  Last Pain:  Vitals:   11/16/18 1313  TempSrc: Temporal  PainSc: 0-No pain         Complications: No apparent anesthesia complications

## 2018-11-16 NOTE — Anesthesia Procedure Notes (Signed)
Procedure Name: Intubation Date/Time: 11/16/2018 1:47 PM Performed by: Johnna Acosta, CRNA Pre-anesthesia Checklist: Patient identified, Emergency Drugs available, Suction available, Patient being monitored and Timeout performed Patient Re-evaluated:Patient Re-evaluated prior to induction Oxygen Delivery Method: Circle system utilized Preoxygenation: Pre-oxygenation with 100% oxygen Induction Type: IV induction Ventilation: Mask ventilation without difficulty Laryngoscope Size: McGraph and 3 Grade View: Grade I Tube type: Oral Tube size: 7.0 mm Airway Equipment and Method: Stylet and Video-laryngoscopy Placement Confirmation: ETT inserted through vocal cords under direct vision,  positive ETCO2 and breath sounds checked- equal and bilateral Secured at: 21 cm Tube secured with: Tape Dental Injury: Teeth and Oropharynx as per pre-operative assessment  Difficulty Due To: Difficulty was anticipated Comments: Hospital bed

## 2018-11-16 NOTE — Progress Notes (Signed)
[facility] [place] NAME: Judith Keller    MR#:  [id number]  DATE OF BIRTH:  1925-04-20  SUBJECTIVE:  CHIEF COMPLAINT:   Chief Complaint  Patient presents with  . Hip Pain  Patient seen and evaluated today Has right hip pain No complaints of chest pain No fever No shortness of breath  REVIEW OF SYSTEMS:    ROS  CONSTITUTIONAL: No documented fever. No fatigue, weakness. No weight gain, no weight loss.  EYES: No blurry or double vision.  ENT: No tinnitus. No postnasal drip. No redness of the oropharynx.  RESPIRATORY: No cough, no wheeze, no hemoptysis. No dyspnea.  CARDIOVASCULAR: No chest pain. No orthopnea. No palpitations. No syncope.  GASTROINTESTINAL: No nausea, no vomiting or diarrhea. No abdominal pain. No melena or hematochezia.  GENITOURINARY: No dysuria or hematuria.  ENDOCRINE: No polyuria or nocturia. No heat or cold intolerance.  HEMATOLOGY: No anemia. No bruising. No bleeding.  INTEGUMENTARY: No rashes. No lesions.  MUSCULOSKELETAL: No arthritis. No swelling. No gout.  Has right hip pain. NEUROLOGIC: No numbness, tingling, or ataxia. No seizure-type activity.  PSYCHIATRIC: No anxiety. No insomnia. No ADD.   DRUG ALLERGIES:  No Known Allergies  VITALS:  Blood pressure 123/84, pulse (!) 108, temperature 98.7 F (37.1 C), temperature source Oral, resp. rate 18, height 5\' 6"  (1.676 m), weight 63.5 kg, SpO2 94 %.  PHYSICAL EXAMINATION:   Physical Exam  GENERAL:  85 y.o.-year-old patient lying in the bed with no acute distress.  EYES: Pupils equal, round, reactive to light and accommodation. No scleral icterus. Extraocular muscles intact.  HEENT: Head atraumatic, normocephalic. Oropharynx and nasopharynx clear.  NECK:  Supple, no jugular venous distention. No thyroid enlargement, no tenderness.  LUNGS: Normal breath sounds bilaterally, no wheezing, rales, rhonchi. No use of accessory muscles of respiration.   CARDIOVASCULAR: S1, S2 normal. No murmurs, rubs, or gallops.  ABDOMEN: Soft, nontender, nondistended. Bowel sounds present. No organomegaly or mass.  EXTREMITIES: No cyanosis, clubbing or edema b/l.  Has right hip tenderness noted   NEUROLOGIC: Cranial nerves II through XII are intact. No focal Motor or sensory deficits b/l.   PSYCHIATRIC: The patient is alert and oriented x 3.  SKIN: No obvious rash, lesion, or ulcer.   LABORATORY PANEL:   CBC Recent Labs  Lab 11/15/18 1312  WBC 15.6*  HGB 12.0  HCT 40.5  PLT 483*   ------------------------------------------------------------------------------------------------------------------ Chemistries  Recent Labs  Lab 11/15/18 1312  NA 134*  K 4.7  CL 93*  CO2 29  GLUCOSE 129*  BUN 36*  CREATININE 0.94  CALCIUM 9.4  AST 25  ALT 17  ALKPHOS 80  BILITOT 1.3*   ------------------------------------------------------------------------------------------------------------------  Cardiac Enzymes Recent Labs  Lab 11/15/18 1312  TROPONINI <0.03   ------------------------------------------------------------------------------------------------------------------  RADIOLOGY:  Dg Chest 1 View  Result Date: 11/15/2018 CLINICAL DATA:  Preoperative chest x-ray, history of hip fracture EXAM: CHEST  1 VIEW COMPARISON:  05/29/2018 FINDINGS: Cardiac shadow is mildly enlarged in size. Aortic calcifications are seen. Skin folds are noted over the right lung apex. Patchy atelectatic changes are noted in the right base. Scarring is noted within the left lung stable from the prior study. Degenerative changes of the thoracic spine are seen. No acute bony abnormality is noted. IMPRESSION: Right basilar atelectasis. Right basilar atelectasis as described. Electronically Signed   By: Inez Catalina M.D.   On: 11/15/2018 14:32   Ct Head Wo Contrast  Result Date: 11/15/2018 CLINICAL DATA:  Head trauma secondary to a fall on 11/07/2018. Headache. Bruising  at the right temple. EXAM: CT HEAD WITHOUT CONTRAST TECHNIQUE: Contiguous axial images were obtained from the base of the skull through the vertex without intravenous contrast. COMPARISON:  Report of brain MRI dated 12/29/2001 FINDINGS: Brain: There is no acute intracranial hemorrhage. There is diffuse cerebral cortical atrophy with scattered periventricular white matter lucency consistent with small vessel ischemic disease. Secondary slight dilatation of ventricular system in proportion to the degree of atrophy. Vascular: No hyperdense vessel or unexpected calcification. Skull: Normal. Negative for fracture or focal lesion. Sinuses/Orbits: No significant abnormality. Tiny air-fluid levels in the sphenoid sinus. Other: None IMPRESSION: 1. No acute intracranial abnormality. 2. Diffuse mild atrophy. 3. Scattered periventricular white matter disease most likely small vessel ischemic changes. Electronically Signed   By: Lorriane Shire M.D.   On: 11/15/2018 16:00   Dg Hip Unilat W Or Wo Pelvis 2-3 Views Right  Result Date: 11/15/2018 CLINICAL DATA:  Right hip pain since a fall 11/04/2018. Initial encounter. EXAM: DG HIP (WITH OR WITHOUT PELVIS) 2-3V RIGHT COMPARISON:  None. FINDINGS: The patient has an acute subcapital fracture of the right hip. No other acute bony or joint abnormality is identified. Lower lumbar degenerative disease is seen. Atherosclerosis noted. IMPRESSION: Acute subcapital fracture right hip. Electronically Signed   By: Inge Rise M.D.   On: 11/15/2018 14:31     ASSESSMENT AND PLAN:   85 year old female patient with history of atrial fibrillation on Coumadin, hypertension, recurrent falls, fracture to right fibula and ankle status post ORIF leg, breast cancer, mastectomy, osteomyelitis of right ankle status post debridement currently under hospitalist service for right hip pain.  -Subcapital right hip fracture Appreciate orthopedic surgery evaluation ORIF procedure today for hip  fracture Pain management to continue N.p.o. for now  -Recurrent falls Physical therapy evaluation after surgery Gait and balance training  -Leukocytosis Appears stress-induced Recently treated for pneumonia and completed course of antibiotic  -Chronic atrial fibrillation Continue rate control with metoprolol Coumadin on hold for surgery  -Anxiety depression Continue citalopram daily and oral melatonin  All the records are reviewed and case discussed with Care Management/Social Worker. Management plans discussed with the patient, family and they are in agreement.  CODE STATUS: DNR  DVT Prophylaxis: SCDs  TOTAL TIME TAKING CARE OF THIS PATIENT: 37 minutes.   POSSIBLE D/C IN 2 to 3 DAYS, DEPENDING ON CLINICAL CONDITION.  Saundra Shelling M.D on 11/16/2018 at 10:07 AM  Between 7am to 6pm - Pager - [phone]  After 6pm go to [email or web address] - password [email or web address] [facility]  Office  [phone]  CC: Primary care physician; Harlow Ohms, MD  Note: This dictation was prepared with Dragon dictation along with smaller phrase technology. Any transcriptional errors that result from this process are unintentional.

## 2018-11-17 ENCOUNTER — Encounter: Payer: Self-pay | Admitting: Orthopedic Surgery

## 2018-11-17 LAB — CBC
HCT: 38.9 % (ref 36.0–46.0)
Hemoglobin: 11.6 g/dL — ABNORMAL LOW (ref 12.0–15.0)
MCH: 25.5 pg — ABNORMAL LOW (ref 26.0–34.0)
MCHC: 29.8 g/dL — ABNORMAL LOW (ref 30.0–36.0)
MCV: 85.5 fL (ref 80.0–100.0)
Platelets: 481 10*3/uL — ABNORMAL HIGH (ref 150–400)
RBC: 4.55 MIL/uL (ref 3.87–5.11)
RDW: 16.9 % — ABNORMAL HIGH (ref 11.5–15.5)
WBC: 12.3 10*3/uL — ABNORMAL HIGH (ref 4.0–10.5)
nRBC: 0 % (ref 0.0–0.2)

## 2018-11-17 LAB — BASIC METABOLIC PANEL
Anion gap: 13 (ref 5–15)
BUN: 37 mg/dL — ABNORMAL HIGH (ref 8–23)
CO2: 24 mmol/L (ref 22–32)
Calcium: 8.7 mg/dL — ABNORMAL LOW (ref 8.9–10.3)
Chloride: 100 mmol/L (ref 98–111)
Creatinine, Ser: 0.85 mg/dL (ref 0.44–1.00)
GFR calc Af Amer: >60 mL/min (ref >60)
GFR calc non Af Amer: 60 mL/min — ABNORMAL LOW (ref >60)
Glucose, Bld: 143 mg/dL — ABNORMAL HIGH (ref 70–99)
Potassium: 4.5 mmol/L (ref 3.5–5.1)
Sodium: 137 mmol/L (ref 135–145)

## 2018-11-17 LAB — PROTIME-INR
INR: 1.6 — ABNORMAL HIGH (ref 0.8–1.2)
Prothrombin Time: 18.7 seconds — ABNORMAL HIGH (ref 11.4–15.2)

## 2018-11-17 MED ORDER — WARFARIN - PHARMACIST DOSING INPATIENT: Freq: Every day | Status: DC

## 2018-11-17 MED ORDER — WARFARIN SODIUM 6 MG PO TABS
6.0000 mg | ORAL_TABLET | Freq: Once | ORAL | Status: AC
  Administered 2018-11-17: 6 mg via ORAL
  Filled 2018-11-17: qty 1

## 2018-11-17 NOTE — NC FL2 (Signed)
[place] LEVEL OF CARE SCREENING TOOL     IDENTIFICATION  Patient Name: Judith Keller Birthdate: 07-Aug-1924 Sex: female Admission Date (Current Location): 11/15/2018  [place] and [place] Number:  Selena Lesser [phone] [place] and Address:  [facility], [address] [place], [place] [address]      Provider Number: [id number]  Attending Physician Name and Address:  Saundra Shelling, MD  Relative Name and Phone Number:  Nayah Lukens [phone]    Current Level of Care: Hospital Recommended Level of Care: [place] Prior Approval Number:    Date Approved/Denied: 05/15/18 PASRR Number: [id number]  Discharge Plan: SNF    Current Diagnoses: Patient Active Problem List   Diagnosis Date Noted  . S/P right hip fracture 11/15/2018    Orientation RESPIRATION BLADDER Height & Weight     Self, Time, Situation, Place  Normal Continent Weight: 63.5 kg Height:  5\' 6"  (167.6 cm)  BEHAVIORAL SYMPTOMS/MOOD NEUROLOGICAL BOWEL NUTRITION STATUS      Continent Diet(regular)  AMBULATORY STATUS COMMUNICATION OF NEEDS Skin   Extensive Assist   Normal, Surgical wounds                       Personal Care Assistance Level of Assistance  Dressing, Bathing Bathing Assistance: Limited assistance   Dressing Assistance: Limited assistance     Functional Limitations Info  Sight, Hearing, Speech Sight Info: Adequate Hearing Info: Adequate Speech Info: Adequate    SPECIAL CARE FACTORS FREQUENCY  PT (By licensed PT)     PT Frequency: 5 times per week              Contractures Contractures Info: Not present    Additional Factors Info  Code Status Code Status Info: DNR             Current Medications (11/17/2018):  This is the current hospital active medication list Current Facility-Administered Medications  Medication Dose Route Frequency Provider Last Rate Last Dose  . 0.9 %  sodium chloride infusion    Intravenous Continuous Lovell Sheehan, MD 75 mL/hr at 11/16/18 2011    . acetaminophen (TYLENOL) tablet 650 mg  650 mg Oral Q6H PRN Lovell Sheehan, MD   650 mg at 11/17/18 0908  . albuterol (PROVENTIL) (2.5 MG/3ML) 0.083% nebulizer solution 2.5 mg  2.5 mg Inhalation Q4H PRN Lovell Sheehan, MD      . aspirin chewable tablet 81 mg  81 mg Oral BID Lovell Sheehan, MD   81 mg at 11/17/18 0908  . cholecalciferol (VITAMIN D) tablet 2,000 Units  2,000 Units Oral Daily Lovell Sheehan, MD   2,000 Units at 11/17/18 [phone]  . citalopram (CELEXA) tablet 10 mg  10 mg Oral Daily Lovell Sheehan, MD   10 mg at 11/17/18 4008  . diltiazem (CARDIZEM CD) 24 hr capsule 120 mg  120 mg Oral Daily Lovell Sheehan, MD   120 mg at 11/17/18 0911  . docusate sodium (COLACE) capsule 100 mg  100 mg Oral BID Lovell Sheehan, MD   100 mg at 11/17/18 0908  . fluticasone (FLONASE) 50 MCG/ACT nasal spray 2 spray  2 spray Each Nare QHS Lovell Sheehan, MD   2 spray at 11/16/18 2311  . furosemide (LASIX) tablet 40 mg  40 mg Oral Daily Lovell Sheehan, MD      . lactated ringers infusion   Intravenous Continuous Lovell Sheehan, MD 50 mL/hr at 11/16/18  1826    . Melatonin TABS 5 mg  5 mg Oral QHS Lovell Sheehan, MD   5 mg at 11/16/18 2309  . menthol-cetylpyridinium (CEPACOL) lozenge 3 mg  1 lozenge Oral PRN Lovell Sheehan, MD       Or  . phenol (CHLORASEPTIC) mouth spray 1 spray  1 spray Mouth/Throat PRN Lovell Sheehan, MD      . metoCLOPramide (REGLAN) tablet 5-10 mg  5-10 mg Oral Q8H PRN Lovell Sheehan, MD       Or  . metoCLOPramide (REGLAN) injection 5-10 mg  5-10 mg Intravenous Q8H PRN Lovell Sheehan, MD      . metolazone (ZAROXOLYN) tablet 2.5 mg  2.5 mg Oral Daily Lovell Sheehan, MD   2.5 mg at 11/17/18 0911  . metoprolol succinate (TOPROL-XL) 24 hr tablet 75 mg  75 mg Oral Daily Lovell Sheehan, MD   75 mg at 11/17/18 0908  . morphine 2 MG/ML injection 0.5 mg  0.5 mg Intravenous Q2H PRN Lovell Sheehan, MD      .  multivitamin with minerals tablet 1 tablet  1 tablet Oral Daily Lovell Sheehan, MD   1 tablet at 11/17/18 [phone]  . ondansetron (ZOFRAN) tablet 4 mg  4 mg Oral Q6H PRN Lovell Sheehan, MD       Or  . ondansetron [facility]) injection 4 mg  4 mg Intravenous Q6H PRN Lovell Sheehan, MD      . polyethylene glycol (MIRALAX / GLYCOLAX) packet 17 g  17 g Oral Daily Lovell Sheehan, MD   17 g at 11/17/18 0911  . potassium chloride SA (K-DUR) CR tablet 20 mEq  20 mEq Oral BID Lovell Sheehan, MD   20 mEq at 11/17/18 2876  . predniSONE (DELTASONE) tablet 5 mg  5 mg Oral Q breakfast Lovell Sheehan, MD   5 mg at 11/17/18 8115  . senna (SENOKOT) tablet 8.6 mg  8.6 mg Oral QHS Lovell Sheehan, MD   8.6 mg at 11/16/18 2309  . simvastatin (ZOCOR) tablet 20 mg  20 mg Oral QHS Lovell Sheehan, MD   20 mg at 11/16/18 2307  . traMADol (ULTRAM) tablet 50 mg  50 mg Oral Q6H PRN Lovell Sheehan, MD   50 mg at 11/16/18 2308     Discharge Medications: Please see discharge summary for a list of discharge medications.  Relevant Imaging Results:  Relevant Lab Results:   Additional Information [id number]  Su Hilt, RN

## 2018-11-17 NOTE — Progress Notes (Signed)
[facility] [place] NAME: Judith Keller    MR#:  [id number]  DATE OF BIRTH:  1924/09/13  SUBJECTIVE:  CHIEF COMPLAINT:   Chief Complaint  Patient presents with  . Hip Pain  Patient seen and evaluated today Has decreased right hip pain No complaints of chest pain No fever No shortness of breath  REVIEW OF SYSTEMS:    ROS  CONSTITUTIONAL: No documented fever. No fatigue, weakness. No weight gain, no weight loss.  EYES: No blurry or double vision.  ENT: No tinnitus. No postnasal drip. No redness of the oropharynx.  RESPIRATORY: No cough, no wheeze, no hemoptysis. No dyspnea.  CARDIOVASCULAR: No chest pain. No orthopnea. No palpitations. No syncope.  GASTROINTESTINAL: No nausea, no vomiting or diarrhea. No abdominal pain. No melena or hematochezia.  GENITOURINARY: No dysuria or hematuria.  ENDOCRINE: No polyuria or nocturia. No heat or cold intolerance.  HEMATOLOGY: No anemia. No bruising. No bleeding.  INTEGUMENTARY: No rashes. No lesions.  MUSCULOSKELETAL: No arthritis. No swelling. No gout.  Has right hip pain. NEUROLOGIC: No numbness, tingling, or ataxia. No seizure-type activity.  PSYCHIATRIC: No anxiety. No insomnia. No ADD.   DRUG ALLERGIES:  No Known Allergies  VITALS:  Blood pressure 117/71, pulse (!) 111, temperature (!) 97.4 F (36.3 C), temperature source Oral, resp. rate 18, height 5\' 6"  (1.676 m), weight 63.5 kg, SpO2 92 %.  PHYSICAL EXAMINATION:   Physical Exam  GENERAL:  85 y.o.-year-old patient lying in the bed with no acute distress.  EYES: Pupils equal, round, reactive to light and accommodation. No scleral icterus. Extraocular muscles intact.  HEENT: Head atraumatic, normocephalic. Oropharynx and nasopharynx clear.  NECK:  Supple, no jugular venous distention. No thyroid enlargement, no tenderness.  LUNGS: Normal breath sounds bilaterally, no wheezing, rales, rhonchi. No use of accessory muscles of respiration.   CARDIOVASCULAR: S1, S2 normal. No murmurs, rubs, or gallops.  ABDOMEN: Soft, nontender, nondistended. Bowel sounds present. No organomegaly or mass.  EXTREMITIES: No cyanosis, clubbing or edema b/l.  Has right hip tenderness noted   NEUROLOGIC: Cranial nerves II through XII are intact. No focal Motor or sensory deficits b/l.   PSYCHIATRIC: The patient is alert and oriented x 3.  SKIN: No obvious rash, lesion, or ulcer.   LABORATORY PANEL:   CBC Recent Labs  Lab 11/17/18 0418  WBC 12.3*  HGB 11.6*  HCT 38.9  PLT 481*   ------------------------------------------------------------------------------------------------------------------ Chemistries  Recent Labs  Lab 11/15/18 1312 11/17/18 0418  NA 134* 137  K 4.7 4.5  CL 93* 100  CO2 29 24  GLUCOSE 129* 143*  BUN 36* 37*  CREATININE 0.94 0.85  CALCIUM 9.4 8.7*  AST 25  --   ALT 17  --   ALKPHOS 80  --   BILITOT 1.3*  --    ------------------------------------------------------------------------------------------------------------------  Cardiac Enzymes Recent Labs  Lab 11/15/18 1312  TROPONINI <0.03   ------------------------------------------------------------------------------------------------------------------  RADIOLOGY:  Dg Chest 1 View  Result Date: 11/15/2018 CLINICAL DATA:  Preoperative chest x-ray, history of hip fracture EXAM: CHEST  1 VIEW COMPARISON:  05/29/2018 FINDINGS: Cardiac shadow is mildly enlarged in size. Aortic calcifications are seen. Skin folds are noted over the right lung apex. Patchy atelectatic changes are noted in the right base. Scarring is noted within the left lung stable from the prior study. Degenerative changes of the thoracic spine are seen. No acute bony abnormality is noted. IMPRESSION: Right basilar atelectasis. Right basilar atelectasis as described. Electronically Signed  By: Inez Catalina M.D.   On: 11/15/2018 14:32   Ct Head Wo Contrast  Result Date: 11/15/2018 CLINICAL  DATA:  Head trauma secondary to a fall on 11/07/2018. Headache. Bruising at the right temple. EXAM: CT HEAD WITHOUT CONTRAST TECHNIQUE: Contiguous axial images were obtained from the base of the skull through the vertex without intravenous contrast. COMPARISON:  Report of brain MRI dated 12/29/2001 FINDINGS: Brain: There is no acute intracranial hemorrhage. There is diffuse cerebral cortical atrophy with scattered periventricular white matter lucency consistent with small vessel ischemic disease. Secondary slight dilatation of ventricular system in proportion to the degree of atrophy. Vascular: No hyperdense vessel or unexpected calcification. Skull: Normal. Negative for fracture or focal lesion. Sinuses/Orbits: No significant abnormality. Tiny air-fluid levels in the sphenoid sinus. Other: None IMPRESSION: 1. No acute intracranial abnormality. 2. Diffuse mild atrophy. 3. Scattered periventricular white matter disease most likely small vessel ischemic changes. Electronically Signed   By: Lorriane Shire M.D.   On: 11/15/2018 16:00   Dg Hip Operative Unilat W Or W/o Pelvis Right  Result Date: 11/16/2018 CLINICAL DATA:  RIGHT hemiarthroplasty.  Hip fracture. EXAM: OPERATIVE RIGHT HIP (WITH PELVIS IF PERFORMED) to VIEWS TECHNIQUE: Fluoroscopic spot image(s) were submitted for interpretation post-operatively. COMPARISON:  Pelvis and RIGHT hip 11/15/2018 FINDINGS: RIGHT hemiarthroplasty. Satisfactory position and alignment. IMPRESSION: Satisfactory postoperative appearance. Electronically Signed   By: Staci Righter M.D.   On: 11/16/2018 15:23   Dg Hip Unilat W Or Wo Pelvis 2-3 Views Right  Result Date: 11/15/2018 CLINICAL DATA:  Right hip pain since a fall 11/04/2018. Initial encounter. EXAM: DG HIP (WITH OR WITHOUT PELVIS) 2-3V RIGHT COMPARISON:  None. FINDINGS: The patient has an acute subcapital fracture of the right hip. No other acute bony or joint abnormality is identified. Lower lumbar degenerative disease is  seen. Atherosclerosis noted. IMPRESSION: Acute subcapital fracture right hip. Electronically Signed   By: Inge Rise M.D.   On: 11/15/2018 14:31     ASSESSMENT AND PLAN:   85 year old female patient with history of atrial fibrillation on Coumadin, hypertension, recurrent falls, fracture to right fibula and ankle status post ORIF leg, breast cancer, mastectomy, osteomyelitis of right ankle status post debridement currently under hospitalist service for right hip pain.  -Subcapital right hip fracture Appreciate orthopedic surgery evaluation Status post ORIF procedure yesterday Postop day 1 today Pain management to continue Physical therapy evaluation  -Recurrent falls Physical therapy evaluation after surgery Gait and balance training  -Leukocytosis Appears stress-induced Recently treated for pneumonia and completed course of antibiotic Improving  -Chronic atrial fibrillation Continue rate control with metoprolol Coumadin will be restarted today for anticoagulation Pharmacy consult for Coumadin dosing  -Anxiety depression Continue citalopram daily and oral melatonin  All the records are reviewed and case discussed with Care Management/Social Worker. Management plans discussed with the patient, family and they are in agreement.  CODE STATUS: DNR  DVT Prophylaxis: SCDs  TOTAL TIME TAKING CARE OF THIS PATIENT: 37 minutes.   POSSIBLE D/C IN 2 to 3 DAYS, DEPENDING ON CLINICAL CONDITION.  Saundra Shelling M.D on 11/17/2018 at 10:24 AM  Between 7am to 6pm - Pager - [phone]  After 6pm go to [email or web address] - password [email or web address] [facility]  Office  [phone]  CC: Primary care physician; Harlow Ohms, MD  Note: This dictation was prepared with Dragon dictation along with smaller phrase technology. Any transcriptional errors that result from this process are unintentional.

## 2018-11-17 NOTE — Progress Notes (Signed)
Subjective:  Patient reports pain as mild.    Objective:   VITALS:   Vitals:   11/16/18 1931 11/16/18 2053 11/16/18 2158 11/17/18 0028  BP: 116/66 110/66 115/82 102/73  Pulse: 96 (!) 107 (!) 102 (!) 107  Resp: 18 18 18 18   Temp:      TempSrc:      SpO2: 94% 95% 96% 95%  Weight:      Height:        PHYSICAL EXAM:  Neurologically intact ABD soft Neurovascular intact Sensation intact distally Intact pulses distally Dorsiflexion/Plantar flexion intact Incision: no drainage No cellulitis present Compartment soft  LABS  Results for orders placed or performed during the hospital encounter of 11/15/18 (from the past 24 hour(s))  Protime-INR     Status: Abnormal   Collection Time: 11/16/18  8:37 AM  Result Value Ref Range   Prothrombin Time 16.9 (H) 11.4 - 15.2 seconds   INR 1.4 (H) 0.8 - 1.2  CBC     Status: Abnormal   Collection Time: 11/17/18  4:18 AM  Result Value Ref Range   WBC 12.3 (H) 4.0 - 10.5 K/uL   RBC 4.55 3.87 - 5.11 MIL/uL   Hemoglobin 11.6 (L) 12.0 - 15.0 g/dL   HCT 38.9 36.0 - 46.0 %   MCV 85.5 80.0 - 100.0 fL   MCH 25.5 (L) 26.0 - 34.0 pg   MCHC 29.8 (L) 30.0 - 36.0 g/dL   RDW 16.9 (H) 11.5 - 15.5 %   Platelets 481 (H) 150 - 400 K/uL   nRBC 0.0 0.0 - 0.2 %  Basic metabolic panel     Status: Abnormal   Collection Time: 11/17/18  4:18 AM  Result Value Ref Range   Sodium 137 135 - 145 mmol/L   Potassium 4.5 3.5 - 5.1 mmol/L   Chloride 100 98 - 111 mmol/L   CO2 24 22 - 32 mmol/L   Glucose, Bld 143 (H) 70 - 99 mg/dL   BUN 37 (H) 8 - 23 mg/dL   Creatinine, Ser 0.85 0.44 - 1.00 mg/dL   Calcium 8.7 (L) 8.9 - 10.3 mg/dL   GFR calc non Af Amer 60 (L) >60 mL/min   GFR calc Af Amer >60 >60 mL/min   Anion gap 13 5 - 15    Dg Chest 1 View  Result Date: 11/15/2018 CLINICAL DATA:  Preoperative chest x-ray, history of hip fracture EXAM: CHEST  1 VIEW COMPARISON:  05/29/2018 FINDINGS: Cardiac shadow is mildly enlarged in size. Aortic calcifications are  seen. Skin folds are noted over the right lung apex. Patchy atelectatic changes are noted in the right base. Scarring is noted within the left lung stable from the prior study. Degenerative changes of the thoracic spine are seen. No acute bony abnormality is noted. IMPRESSION: Right basilar atelectasis. Right basilar atelectasis as described. Electronically Signed   By: Inez Catalina M.D.   On: 11/15/2018 14:32   Ct Head Wo Contrast  Result Date: 11/15/2018 CLINICAL DATA:  Head trauma secondary to a fall on 11/07/2018. Headache. Bruising at the right temple. EXAM: CT HEAD WITHOUT CONTRAST TECHNIQUE: Contiguous axial images were obtained from the base of the skull through the vertex without intravenous contrast. COMPARISON:  Report of brain MRI dated 12/29/2001 FINDINGS: Brain: There is no acute intracranial hemorrhage. There is diffuse cerebral cortical atrophy with scattered periventricular white matter lucency consistent with small vessel ischemic disease. Secondary slight dilatation of ventricular system in proportion to the degree of atrophy. Vascular:  No hyperdense vessel or unexpected calcification. Skull: Normal. Negative for fracture or focal lesion. Sinuses/Orbits: No significant abnormality. Tiny air-fluid levels in the sphenoid sinus. Other: None IMPRESSION: 1. No acute intracranial abnormality. 2. Diffuse mild atrophy. 3. Scattered periventricular white matter disease most likely small vessel ischemic changes. Electronically Signed   By: Lorriane Shire M.D.   On: 11/15/2018 16:00   Dg Hip Operative Unilat W Or W/o Pelvis Right  Result Date: 11/16/2018 CLINICAL DATA:  RIGHT hemiarthroplasty.  Hip fracture. EXAM: OPERATIVE RIGHT HIP (WITH PELVIS IF PERFORMED) to VIEWS TECHNIQUE: Fluoroscopic spot image(s) were submitted for interpretation post-operatively. COMPARISON:  Pelvis and RIGHT hip 11/15/2018 FINDINGS: RIGHT hemiarthroplasty. Satisfactory position and alignment. IMPRESSION: Satisfactory  postoperative appearance. Electronically Signed   By: Staci Righter M.D.   On: 11/16/2018 15:23   Dg Hip Unilat W Or Wo Pelvis 2-3 Views Right  Result Date: 11/15/2018 CLINICAL DATA:  Right hip pain since a fall 11/04/2018. Initial encounter. EXAM: DG HIP (WITH OR WITHOUT PELVIS) 2-3V RIGHT COMPARISON:  None. FINDINGS: The patient has an acute subcapital fracture of the right hip. No other acute bony or joint abnormality is identified. Lower lumbar degenerative disease is seen. Atherosclerosis noted. IMPRESSION: Acute subcapital fracture right hip. Electronically Signed   By: Inge Rise M.D.   On: 11/15/2018 14:31    Assessment/Plan: 1 Day Post-Op   Active Problems:   S/P right hip fracture   Advance diet Up with therapy  WBAT right lower extremity Discharge per medicine   Carlynn Spry , MD 11/17/2018, 6:49 AM

## 2018-11-17 NOTE — Consult Note (Signed)
ANTICOAGULATION CONSULT NOTE - Follow Up Consult  Pharmacy Consult for Warfarin dosing  Indication: atrial fibrillation  No Known Allergies  Patient Measurements: Height: 5\' 6"  (167.6 cm) Weight: 140 lb (63.5 kg) IBW/kg (Calculated) : 59.3   Vital Signs: Temp: 97.4 F (36.3 C) (05/08 0905) Temp Source: Oral (05/08 0905) BP: 117/71 (05/08 0905) Pulse Rate: 111 (05/08 0905)  Labs: Recent Labs    11/15/18 1312 11/16/18 0837 11/17/18 0418  HGB 12.0  --  11.6*  HCT 40.5  --  38.9  PLT 483*  --  481*  LABPROT 16.4* 16.9*  --   INR 1.3* 1.4*  --   CREATININE 0.94  --  0.85  TROPONINI <0.03  --   --     Estimated Creatinine Clearance: 40.4 mL/min (by C-G formula based on SCr of 0.85 mg/dL).   Significant Medications:  Warfarin 4 mg daily -last dose 11/14/18 @2000  Citalopram  Simvastatin  Aspirin  Prednisone  APAP  Assessment: Patient was admitted for a recent hip fracture and is now 1 day post-op. At this time it is appropriate to resume warfarin. There is no INR for today- will need to order.  However, since the patient was admitted her INR has been subtherapeutic-even prior to being held. May likely need to boost her dose.   PMH: HTN, arthirits, hyperlipidemia  DDI (IP meds): Citalopram -may increase risk of bleeding  Simvastatin -may increase bleeding/rhabdomyolysis risks Aspirin -may increase bleeding risk Prednisone - may increase risk of bleeding or diminished effects of warfarin (patient specific) APAP (>2g/day)- may increase INR/risk of bleeding   Goal of Therapy:  INR 2-3 Monitor platelets by anticoagulation protocol: Yes   Plan:  Will obtain INR and dose warfarin appropriately.   Kristeen Miss, PharmD

## 2018-11-17 NOTE — Consult Note (Signed)
ANTICOAGULATION CONSULT NOTE - Follow Up Consult  Pharmacy Consult for Warfarin dosing  Indication: atrial fibrillation  No Known Allergies  Patient Measurements: Height: 5\' 6"  (167.6 cm) Weight: 140 lb (63.5 kg) IBW/kg (Calculated) : 59.3   Vital Signs: Temp: 97.8 F (36.6 C) (05/08 1514) Temp Source: Oral (05/08 0905) BP: 104/65 (05/08 1514) Pulse Rate: 82 (05/08 1514)  Labs: Recent Labs    11/15/18 1312 11/16/18 0837 11/17/18 0418 11/17/18 1226  HGB 12.0  --  11.6*  --   HCT 40.5  --  38.9  --   PLT 483*  --  481*  --   LABPROT 16.4* 16.9*  --  18.7*  INR 1.3* 1.4*  --  1.6*  CREATININE 0.94  --  0.85  --   TROPONINI <0.03  --   --   --     Estimated Creatinine Clearance: 40.4 mL/min (by C-G formula based on SCr of 0.85 mg/dL).   Significant Medications:  Warfarin 4 mg daily -last dose 11/14/18 @2000  Citalopram  Simvastatin  Aspirin  Prednisone  APAP  Assessment: Patient was admitted for a recent hip fracture and is now 1 day post-op. At this time it is appropriate to resume warfarin. INR subtherapeutic and will need to boost her dose.   PMH: HTN, arthirits, hyperlipidemia  DDI (IP meds): Citalopram -may increase risk of bleeding  Simvastatin -may increase bleeding/rhabdomyolysis risks Aspirin -may increase bleeding risk Prednisone - may increase risk of bleeding or diminished effects of warfarin (patient specific) APAP (>2g/day)- may increase INR/risk of bleeding    Date  INR/Dose (mg  5/6 1.3 (none)  5/7 1.4 (none)  5/8 1.6       Goal of Therapy:  INR 2-3 Monitor platelets by anticoagulation protocol: Yes   Plan:  Will order warfarin 6 mg x1. Will order and INR with AM labs.   Kristeen Miss, PharmD

## 2018-11-17 NOTE — Progress Notes (Signed)
Physical Therapy Treatment Patient Details Name: Judith Keller MRN: [id number] DOB: 02/27/1925 Today's Date: 11/17/2018    History of Present Illness 85 y/o female who apparently fell ~2 weeks ago and for a time was able to do some activity at her rehab facility but then started having more pain/issues and was found to have had R hip fx.  S/p R anterior hip replacement 5/7.  Pt with R ankle fx/ORIF 11/19 and per discussion with her rehab facility she is WBAT with the walking boot on.      PT Comments    Pt motivated to work with PT, but fearful secondary to her multiple falls and after initially taking a few confident steps she became anxious and needed to return the the bed.  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 Pt making slow, but steady gains, disappointed that she has to spend her birthday in the hospital tomorrow.   Follow Up Recommendations  SNF     Equipment Recommendations  None recommended by PT    Recommendations for Other Services       Precautions / Restrictions Precautions Precautions: Anterior Hip;Fall Restrictions Weight Bearing Restrictions: Yes RLE Weight Bearing: Weight bearing as tolerated    Mobility  Bed Mobility Overal bed mobility: Needs Assistance Bed Mobility: Sit to Supine     Supine to sit: Min assist Sit to supine: Min assist   General bed mobility comments: Pt showed great effort but ultimately needed light assist to lift LEs fully into bed and get  Transfers Overall transfer level: Needs assistance Equipment used: Rolling walker (2 wheeled) Transfers: Sit to/from Stand Sit to Stand: Min assist         General transfer comment: Pt unable to rise on attempt w/o assist, but needed only light assist to shift weight forward enough to attain standing.  Heavy reliance on walker, R walking boot  donned  Ambulation/Gait Ambulation/Gait assistance: Min assist Gait Distance (Feet): 6 Feet Assistive device: Rolling walker (2 wheeled)       General Gait Details: Pt initially was able to take ~2 steps confidently, then became fearful of falling (despite doing fairly well) and needed to return to the bed w/ assist with short, unsteady steps   Stairs             Wheelchair Mobility    Modified Rankin (Stroke Patients Only)       Balance Overall balance assessment: Modified Independent                                          Cognition Arousal/Alertness: Awake/alert Behavior During Therapy: WFL for tasks assessed/performed Overall Cognitive Status: Within Functional Limits for tasks assessed                                        Exercises Total Joint Exercises Ankle Circles/Pumps: AROM;10 reps Quad Sets: Strengthening;10 reps Gluteal Sets: 10 reps;AROM Short Arc Quad: Strengthening;10 reps Heel Slides: 10 reps;AROM(resisted leg extensions) Hip ABduction/ADduction: AROM;10 reps Straight Leg Raises: AAROM;10 reps;AROM(able to do the last 2 w/o assist)    General Comments        Pertinent Vitals/Pain Pain Assessment: 0-10 Pain Score: 1  Pain Location: R hip    Home Living Family/patient expects to be discharged to:: Skilled nursing facility               Additional Comments: [facility] long term resident    Prior Function Level of Independence: Independent with assistive device(s)      Comments: Pt had recovered well from ankle fx 6 months ago and apparently was walking >100 ft just before fall 2 weeks ago   PT Goals (current goals can now be found in the care plan section) Acute Rehab PT Goals Patient Stated Goal: get back to walking well PT Goal Formulation: With patient Time For Goal Achievement: 12/01/18 Potential to Achieve Goals: Good Progress towards PT goals: Progressing toward goals     Frequency    BID      PT Plan Current plan remains appropriate    Co-evaluation              AM-PAC PT "6 Clicks" Mobility   Outcome Measure  Help needed turning from your back to your side while in a flat bed without using bedrails?: A Little Help needed moving from lying on your back to sitting on the side of a flat bed without using bedrails?: A Little Help needed moving to and from a bed to a chair (including a wheelchair)?: A Little Help needed standing up from a chair using your arms (e.g., wheelchair or bedside chair)?: A Little Help needed to walk in hospital room?: A Lot Help needed climbing 3-5 steps with a railing? : Total 6 Click Score: 15    End of Session Equipment Utilized During Treatment: Gait belt;Oxygen Activity Tolerance: Patient limited by fatigue;Patient tolerated treatment well Patient left: with chair alarm set;with call bell/phone within reach Nurse Communication: Mobility status PT Visit Diagnosis: Muscle weakness (generalized) (M62.81);Difficulty in walking, not elsewhere classified (R26.2);Pain Pain - Right/Left: Right Pain - part of body: Hip     Time: 1443-1510 PT Time Calculation (min) (ACUTE ONLY): 27 min  Charges:  $Gait Training: 8-22 mins $Therapeutic Exercise: 8-22 mins                     Kreg Shropshire, DPT 11/17/2018, 4:32 PM

## 2018-11-17 NOTE — TOC Initial Note (Signed)
Transition of Care [facility]) - Initial/Assessment Note    Patient Details  Name: Judith Keller MRN: [id number] Date of Birth: [birth year]  Transition of Care [facility]) CM/SW Contact:    Su Hilt, RN Phone Number: 11/17/2018, 9:40 AM  Clinical Narrative:                 Spoke with the patient and completed assessment, talked about DC plan and needs,  She lives in the long term side of [facility] and has for sometime, she uses a WC at baseline and walks very short distances,  She plans to return to [facility] at DC I called and spoke to [place] at [facility] and she verifies that the patient will retun at DC and will go to the 700 hall. FL2 to be completed and everything sent via the HUB to [facility]  Expected Discharge Plan: Skilled Nursing Facility([facility]) Barriers to Discharge: Continued Medical Work up   Patient Goals and CMS Choice Patient states their goals for this hospitalization and ongoing recovery are:: go back to [place] CMS Medicare.gov Compare Post Acute Care list provided to:: Patient Choice offered to / list presented to : Patient  Expected Discharge Plan and Services Expected Discharge Plan: Skilled Nursing Facility([facility])   Discharge Planning Services: CM Consult Post Acute Care Choice: [place] Living arrangements for the past 2 months: [place]                                      Prior Living Arrangements/Services Living arrangements for the past 2 months: [place] Lives with:: Facility Resident Patient language and need for interpreter reviewed:: No Do you feel safe going back to the place where you live?: Yes      Need for Family Participation in Patient Care: No (Comment) Care giver support system in place?: Yes (comment) Current home services: DME(wheelchair) Criminal Activity/Legal Involvement Pertinent to Current Situation/Hospitalization: No - Comment as needed  Activities of Daily Living Home Assistive  Devices/Equipment: Wheelchair ADL Screening (condition at time of admission) Patient's cognitive ability adequate to safely complete daily activities?: Yes Is the patient deaf or have difficulty hearing?: Yes Does the patient have difficulty seeing, even when wearing glasses/contacts?: No Does the patient have difficulty concentrating, remembering, or making decisions?: Yes Patient able to express need for assistance with ADLs?: Yes Does the patient have difficulty dressing or bathing?: Yes Independently performs ADLs?: No Communication: Independent Dressing (OT): Needs assistance Is this a change from baseline?: Pre-admission baseline Grooming: Needs assistance Is this a change from baseline?: Pre-admission baseline Feeding: Independent Bathing: Needs assistance Is this a change from baseline?: Pre-admission baseline Toileting: Needs assistance Is this a change from baseline?: Pre-admission baseline In/Out Bed: Needs assistance Is this a change from baseline?: Pre-admission baseline Walks in Home: Needs assistance Is this a change from baseline?: Pre-admission baseline Does the patient have difficulty walking or climbing stairs?: Yes Weakness of Legs: Right Weakness of Arms/Hands: Both  Permission Sought/Granted Permission sought to share information with : Facility Art therapist granted to share information with : Yes, Verbal Permission Granted     Permission granted to share info w AGENCY: [facility]        Emotional Assessment Appearance:: Appears stated age Attitude/Demeanor/Rapport: Engaged Affect (typically observed): Accepting Orientation: : Oriented to Self, Oriented to Place, Oriented to  Time, Oriented to Situation Alcohol / Substance Use: Never  Used Psych Involvement: No (comment)  Admission diagnosis:  Pre-op evaluation [L24.401] Fall, initial encounter [W19.XXXA] Closed fracture of right hip, initial encounter [facility])  [S72.001A] Patient Active Problem List   Diagnosis Date Noted  . S/P right hip fracture 11/15/2018   PCP:  Harlow Ohms, MD Pharmacy:  No Pharmacies Listed    Social Determinants of Health (SDOH) Interventions    Readmission Risk Interventions No flowsheet data found.

## 2018-11-17 NOTE — Evaluation (Signed)
Physical Therapy Evaluation Patient Details Name: Judith Keller MRN: [id number] DOB: 02/03/1925 Today's Date: 11/17/2018   History of Present Illness  85 y/o female who apparently fell ~2 weeks ago and for a time was able to do some activity at her rehab facility but then started having more pain/issues and was found to have had R hip fx.  S/p R anterior hip replacement 5/7.  Pt with R ankle fx/ORIF 11/19 and per discussion with her rehab facility she is WBAT with the walking boot on.    Clinical Impression  Pt did well with PT assessment and did surprisingly well with standing tolerance and a brief bout of ambulation.  She had some weakness/pain issues with supine exercises but showed good effort and participation with ~10 minutes apart from the exam.  PT spoke with rehab staff from her facility and verified that WBAT with boot is okay on R post ankle fx (~6 months).  Pt eager to get back to rehab and working with PT/walking again (has been in w/c last 10 days)    Follow Up Recommendations SNF    Equipment Recommendations  None recommended by PT    Recommendations for Other Services       Precautions / Restrictions Precautions Precautions: Anterior Hip;Fall Restrictions Weight Bearing Restrictions: Yes RLE Weight Bearing: Weight bearing as tolerated(walking boot )      Mobility  Bed Mobility Overal bed mobility: Needs Assistance Bed Mobility: Supine to Sit     Supine to sit: Min assist     General bed mobility comments: Pt able to assist getting up to sitting needing only light assit to fully lift torso  Transfers Overall transfer level: Needs assistance Equipment used: Rolling walker (2 wheeled) Transfers: Sit to/from Stand Sit to Stand: Min assist         General transfer comment: Cuing for set up and sequencing, pt showed great effort in getting to standing with only min assist  Ambulation/Gait Ambulation/Gait assistance: Min assist Gait Distance (Feet): 6  Feet Assistive device: Rolling walker (2 wheeled)       General Gait Details: Pt was able to turn 90 deg to get squared to recliner, and was then able to take a few steps forward with reliance on walker and chair follow to sit.  Stairs            Wheelchair Mobility    Modified Rankin (Stroke Patients Only)       Balance Overall balance assessment: Modified Independent                                           Pertinent Vitals/Pain Pain Assessment: 0-10 Pain Score: 2 (minimal R hip pain at rest, increases significantly with ROM) Pain Location: R hip    Home Living Family/patient expects to be discharged to:: Skilled nursing facility                 Additional Comments: [facility] long term resident    Prior Function Level of Independence: Independent with assistive device(s)         Comments: Pt had recovered well from ankle fx 6 months ago and apparently was walking >100 ft just before fall 2 weeks ago     Hand Dominance        Extremity/Trunk Assessment   Upper Extremity Assessment Upper Extremity Assessment: Generalized weakness(age appropriate limitations)  Lower Extremity Assessment Lower Extremity Assessment: Generalized weakness(expected R post-op weakness)       Communication   Communication: No difficulties  Cognition Arousal/Alertness: Awake/alert Behavior During Therapy: WFL for tasks assessed/performed Overall Cognitive Status: Within Functional Limits for tasks assessed                                        General Comments      Exercises Total Joint Exercises Ankle Circles/Pumps: AROM;10 reps Quad Sets: Strengthening;10 reps Gluteal Sets: 10 reps;AROM Heel Slides: AAROM;AROM;10 reps Hip ABduction/ADduction: AROM;AAROM;10 reps   Assessment/Plan    PT Assessment Patient needs continued PT services  PT Problem List Decreased strength;Decreased range of motion;Decreased activity  tolerance;Decreased balance;Decreased mobility;Decreased coordination;Decreased knowledge of use of DME;Decreased safety awareness;Decreased knowledge of precautions;Cardiopulmonary status limiting activity;Pain       PT Treatment Interventions DME instruction;Gait training;Stair training;Functional mobility training;Therapeutic activities;Therapeutic exercise;Balance training;Neuromuscular re-education;Patient/family education    PT Goals (Current goals can be found in the Care Plan section)  Acute Rehab PT Goals Patient Stated Goal: get back to walking well PT Goal Formulation: With patient Time For Goal Achievement: 12/01/18 Potential to Achieve Goals: Good    Frequency BID   Barriers to discharge        Co-evaluation               AM-PAC PT "6 Clicks" Mobility  Outcome Measure Help needed turning from your back to your side while in a flat bed without using bedrails?: A Little Help needed moving from lying on your back to sitting on the side of a flat bed without using bedrails?: A Little Help needed moving to and from a bed to a chair (including a wheelchair)?: A Little Help needed standing up from a chair using your arms (e.g., wheelchair or bedside chair)?: A Little Help needed to walk in hospital room?: A Lot Help needed climbing 3-5 steps with a railing? : Total 6 Click Score: 15    End of Session Equipment Utilized During Treatment: Gait belt;Oxygen Activity Tolerance: Patient limited by fatigue;Patient tolerated treatment well Patient left: with chair alarm set;with call bell/phone within reach Nurse Communication: Mobility status PT Visit Diagnosis: Muscle weakness (generalized) (M62.81);Difficulty in walking, not elsewhere classified (R26.2);Pain Pain - Right/Left: Right Pain - part of body: Hip    Time: 1133-1202 PT Time Calculation (min) (ACUTE ONLY): 29 min   Charges:   PT Evaluation $PT Eval Low Complexity: 1 Low PT Treatments $Therapeutic  Exercise: 8-22 mins        Kreg Shropshire, DPT 11/17/2018, 2:02 PM

## 2018-11-18 LAB — CBC
HCT: 33.8 % — ABNORMAL LOW (ref 36.0–46.0)
Hemoglobin: 10 g/dL — ABNORMAL LOW (ref 12.0–15.0)
MCH: 25.8 pg — ABNORMAL LOW (ref 26.0–34.0)
MCHC: 29.6 g/dL — ABNORMAL LOW (ref 30.0–36.0)
MCV: 87.1 fL (ref 80.0–100.0)
Platelets: 439 10*3/uL — ABNORMAL HIGH (ref 150–400)
RBC: 3.88 MIL/uL (ref 3.87–5.11)
RDW: 16.7 % — ABNORMAL HIGH (ref 11.5–15.5)
WBC: 13.3 10*3/uL — ABNORMAL HIGH (ref 4.0–10.5)
nRBC: 0 % (ref 0.0–0.2)

## 2018-11-18 LAB — PROTIME-INR
INR: 1.7 — ABNORMAL HIGH (ref 0.8–1.2)
Prothrombin Time: 19.7 seconds — ABNORMAL HIGH (ref 11.4–15.2)

## 2018-11-18 MED ORDER — WARFARIN SODIUM 5 MG PO TABS
5.0000 mg | ORAL_TABLET | Freq: Once | ORAL | Status: DC
  Filled 2018-11-18: qty 1

## 2018-11-18 MED ORDER — TRAMADOL HCL 50 MG PO TABS: 50.0000 mg | ORAL_TABLET | Freq: Four times a day (QID) | ORAL | 0 refills | Status: AC | PRN

## 2018-11-18 MED ORDER — ASPIRIN EC 81 MG PO TBEC: 81.0000 mg | DELAYED_RELEASE_TABLET | Freq: Every day | ORAL | 2 refills | Status: AC

## 2018-11-18 MED ORDER — DOCUSATE SODIUM 100 MG PO CAPS: 100.0000 mg | ORAL_CAPSULE | Freq: Two times a day (BID) | ORAL | 0 refills | Status: DC | PRN

## 2018-11-18 MED ORDER — WARFARIN SODIUM 5 MG PO TABS: 5.0000 mg | ORAL_TABLET | Freq: Once | ORAL | 0 refills | Status: DC

## 2018-11-18 NOTE — Progress Notes (Signed)
EMS called to transport

## 2018-11-18 NOTE — Discharge Instructions (Signed)
Follow-up with primary care physician at the facility in 2 to 3 days Repeat PT/INR on Monday Follow-up with orthopedics Dr. Harlow Mares in 2 weeks

## 2018-11-18 NOTE — Discharge Summary (Signed)
Judith Keller at [place] NAME: Judith Keller    MR#:  [id number]  DATE OF BIRTH:  19-Jun-1925  DATE OF ADMISSION:  11/15/2018 ADMITTING PHYSICIAN: Gladstone Lighter, MD  DATE OF DISCHARGE:  11/18/18   PRIMARY CARE PHYSICIAN: Harlow Ohms, MD    ADMISSION DIAGNOSIS:  Pre-op evaluation [K53.976] Fall, initial encounter [phone]] Closed fracture of right hip, initial encounter ([place]) [S72.001A]  DISCHARGE DIAGNOSIS:  Active Problems:   S/P right hip fracture   SECONDARY DIAGNOSIS:   Past Medical History:  Diagnosis Date  . Atrial fibrillation ([place])   . Heart failure ([place])   . Hyperlipidemia   . Hypertension   . Hypokalemia   . Osteoarthritis     HOSPITAL COURSE:   85 year old female patient with history of atrial fibrillation on Coumadin, hypertension, recurrent falls, fracture to right fibula and ankle status post ORIF leg, breast cancer, mastectomy, osteomyelitis of right ankle status post debridement currently under hospitalist service for right hip pain.  -Subcapital right hip fracture Appreciate orthopedic surgery evaluation Status post ORIF procedure yesterday Postop day 2 today Pain management to continue as needed Physical therapy evaluation-skilled nursing facility  -Recurrent falls Physical therapy evaluation after surgery-recommending skilled nursing facility Gait and balance training  -Leukocytosis Appears stress-induced Recently treated for pneumonia and completed course of antibiotic Improving  -Chronic atrial fibrillation Continue rate control with metoprolol Coumadin will be restarted today for anticoagulation Pharmacy consult for Coumadin dosing.  INR 1.7 continue close monitoring of the PT/INR and titrate the Coumadin dose as needed  -Anxiety depression Continue citalopram daily and oral melatonin   DISCHARGE CONDITIONS:   Stable   CONSULTS OBTAINED:  Treatment Team:  Lovell Sheehan,  MD   PROCEDURES  Rt hip surgery  DRUG ALLERGIES:  No Known Allergies  DISCHARGE MEDICATIONS:   Allergies as of 11/18/2018   No Known Allergies     Medication List    TAKE these medications   acetaminophen 325 MG tablet Commonly known as:  TYLENOL Take 650 mg by mouth every 6 (six) hours as needed for mild pain. What changed:  Another medication with the same name was removed. Continue taking this medication, and follow the directions you see here.   albuterol 108 (90 Base) MCG/ACT inhaler Commonly known as:  VENTOLIN HFA Inhale 1 puff into the lungs every 4 (four) hours as needed for wheezing or shortness of breath.   aspirin EC 81 MG tablet Take 1 tablet (81 mg total) by mouth daily.   cholecalciferol 25 MCG (1000 UT) tablet Commonly known as:  VITAMIN D Take 2,000 Units by mouth daily.   citalopram 10 MG tablet Commonly known as:  CELEXA Take 10 mg by mouth daily.   diltiazem 120 MG 24 hr capsule Commonly known as:  DILACOR XR Take 120 mg by mouth daily.   docusate sodium 100 MG capsule Commonly known as:  COLACE Take 1 capsule (100 mg total) by mouth 2 (two) times daily as needed for mild constipation.   fluticasone 50 MCG/ACT nasal spray Commonly known as:  FLONASE Place 2 sprays into both nostrils at bedtime.   furosemide 20 MG tablet Commonly known as:  LASIX Take 40 mg by mouth daily.   Melatonin 3 MG Tabs Take 3 mg by mouth at bedtime.   metolazone 2.5 MG tablet Commonly known as:  ZAROXOLYN Take 2.5 mg by mouth daily.   metoprolol succinate 25 MG 24 hr tablet Commonly known as:  TOPROL-XL Take 75 mg by mouth daily. Take with or immediately following a meal.   multivitamin with minerals Tabs tablet Take 1 tablet by mouth daily.   polyethylene glycol 17 g packet Commonly known as:  MIRALAX / GLYCOLAX Take 17 g by mouth daily.   potassium chloride SA 20 MEQ tablet Commonly known as:  K-DUR Take 20 mEq by mouth 2 (two) times daily.    predniSONE 5 MG tablet Commonly known as:  DELTASONE Take 5 mg by mouth daily with breakfast.   senna 8.6 MG Tabs tablet Commonly known as:  SENOKOT Take 8.6 mg by mouth at bedtime.   simvastatin 20 MG tablet Commonly known as:  ZOCOR Take 20 mg by mouth at bedtime.   traMADol 50 MG tablet Commonly known as:  ULTRAM Take 1 tablet (50 mg total) by mouth every 6 (six) hours as needed for moderate pain.   warfarin 5 MG tablet Commonly known as:  COUMADIN Take 1 tablet (5 mg total) by mouth one time only at 6 PM for 4 days. What changed:    medication strength  how much to take  when to take this        DISCHARGE INSTRUCTIONS:   Follow-up with primary care physician at the facility in 2 to 3 days Repeat PT/INR on Monday Follow-up with orthopedics Dr. Harlow Mares in 2 weeks  DIET:  Cardiac diet  DISCHARGE CONDITION:  Fair  ACTIVITY:  Activity as tolerated per PT  OXYGEN:  Home Oxygen: No.   Oxygen Delivery: room air  DISCHARGE LOCATION:  home   If you experience worsening of your admission symptoms, develop shortness of breath, life threatening emergency, suicidal or homicidal thoughts you must seek medical attention immediately by calling 911 or calling your MD immediately  if symptoms less severe.  You Must read complete instructions/literature along with all the possible adverse reactions/side effects for all the Medicines you take and that have been prescribed to you. Take any new Medicines after you have completely understood and accpet all the possible adverse reactions/side effects.   Please note  You were cared for by a hospitalist during your hospital stay. If you have any questions about your discharge medications or the care you received while you were in the hospital after you are discharged, you can call the unit and asked to speak with the hospitalist on call if the hospitalist that took care of you is not available. Once you are discharged, your  primary care physician will handle any further medical issues. Please note that NO REFILLS for any discharge medications will be authorized once you are discharged, as it is imperative that you return to your primary care physician (or establish a relationship with a primary care physician if you do not have one) for your aftercare needs so that they can reassess your need for medications and monitor your lab values.     Today  Chief Complaint  Patient presents with  . Hip Pain   Patient is feeling okay.  Pain is manageable.  Had a bowel movement. Okay to discharge patient from orthopedic standpoint  ROS:  CONSTITUTIONAL: Denies fevers, chills. Denies any fatigue, weakness.  EYES: Denies blurry vision, double vision, eye pain. EARS, NOSE, THROAT: Denies tinnitus, ear pain, hearing loss. RESPIRATORY: Denies cough, wheeze, shortness of breath.  CARDIOVASCULAR: Denies chest pain, palpitations, edema.  GASTROINTESTINAL: Denies nausea, vomiting, diarrhea, abdominal pain. Denies bright red blood per rectum. GENITOURINARY: Denies dysuria, hematuria. ENDOCRINE: Denies nocturia or thyroid problems.  HEMATOLOGIC AND LYMPHATIC: Denies easy bruising or bleeding. SKIN: Denies rash or lesion. MUSCULOSKELETAL: Denies pain in neck, back, shoulder, knees.  Hip pain is okay NEUROLOGIC: Denies paralysis, paresthesias.  PSYCHIATRIC: Denies anxiety or depressive symptoms.   VITAL SIGNS:  Blood pressure 117/77, pulse 94, temperature (!) 97.5 F (36.4 C), temperature source Oral, resp. rate 14, height 5\' 6"  (1.676 m), weight 63.5 kg, SpO2 94 %.  I/O:    Intake/Output Summary (Last 24 hours) at 11/18/2018 1159 Last data filed at 11/18/2018 0938 Gross per 24 hour  Intake 858.92 ml  Output 140 ml  Net 718.92 ml    PHYSICAL EXAMINATION:  GENERAL:  85 y.o.-year-old patient lying in the bed with no acute distress.  EYES: Pupils equal, round, reactive to light and accommodation. No scleral icterus.  Extraocular muscles intact.  HEENT: Head atraumatic, normocephalic. Oropharynx and nasopharynx clear.  NECK:  Supple, no jugular venous distention. No thyroid enlargement, no tenderness.  LUNGS: Normal breath sounds bilaterally, no wheezing, rales,rhonchi or crepitation. No use of accessory muscles of respiration.  CARDIOVASCULAR: S1, S2 normal. No murmurs, rubs, or gallops.  ABDOMEN: Soft, non-tender, non-distended. Bowel sounds present. No organomegaly or mass.  EXTREMITIES: Right hip status post surgery clean dressing no pedal edema, cyanosis, or clubbing.  NEUROLOGIC: Awake, alert and oriented x3 sensation intact. Gait not checked.  PSYCHIATRIC: The patient is alert and oriented x 3.  SKIN: No obvious rash, lesion, or ulcer.   DATA REVIEW:   CBC Recent Labs  Lab 11/18/18 0508  WBC 13.3*  HGB 10.0*  HCT 33.8*  PLT 439*    Chemistries  Recent Labs  Lab 11/15/18 1312 11/17/18 0418  NA 134* 137  K 4.7 4.5  CL 93* 100  CO2 29 24  GLUCOSE 129* 143*  BUN 36* 37*  CREATININE 0.94 0.85  CALCIUM 9.4 8.7*  AST 25  --   ALT 17  --   ALKPHOS 80  --   BILITOT 1.3*  --     Cardiac Enzymes Recent Labs  Lab 11/15/18 1312  TROPONINI <0.03    Microbiology Results  Results for orders placed or performed during the hospital encounter of 11/15/18  SARS Coronavirus 2 (CEPHEID - Performed in [facility] hospital lab), Hosp Order     Status: None   Collection Time: 11/15/18  1:12 PM  Result Value Ref Range Status   SARS Coronavirus 2 NEGATIVE NEGATIVE Final    Comment: (NOTE) If result is NEGATIVE SARS-CoV-2 target nucleic acids are NOT DETECTED. The SARS-CoV-2 RNA is generally detectable in upper and lower  respiratory specimens during the acute phase of infection. The lowest  concentration of SARS-CoV-2 viral copies this assay can detect is 250  copies / mL. A negative result does not preclude SARS-CoV-2 infection  and should not be used as the sole basis for treatment or  other  patient management decisions.  A negative result may occur with  improper specimen collection / handling, submission of specimen other  than nasopharyngeal swab, presence of viral mutation(s) within the  areas targeted by this assay, and inadequate number of viral copies  (<250 copies / mL). A negative result must be combined with clinical  observations, patient history, and epidemiological information. If result is POSITIVE SARS-CoV-2 target nucleic acids are DETECTED. The SARS-CoV-2 RNA is generally detectable in upper and lower  respiratory specimens dur ing the acute phase of infection.  Positive  results are indicative of active infection with SARS-CoV-2.  Clinical  correlation  with patient history and other diagnostic information is  necessary to determine patient infection status.  Positive results do  not rule out bacterial infection or co-infection with other viruses. If result is PRESUMPTIVE POSTIVE SARS-CoV-2 nucleic acids MAY BE PRESENT.   A presumptive positive result was obtained on the submitted specimen  and confirmed on repeat testing.  While 2019 novel coronavirus  (SARS-CoV-2) nucleic acids may be present in the submitted sample  additional confirmatory testing may be necessary for epidemiological  and / or clinical management purposes  to differentiate between  SARS-CoV-2 and other Sarbecovirus currently known to infect humans.  If clinically indicated additional testing with an alternate test  methodology [phone]) is advised. The SARS-CoV-2 RNA is generally  detectable in upper and lower respiratory sp ecimens during the acute  phase of infection. The expected result is Negative. Fact Sheet for Patients:  StrictlyIdeas.no Fact Sheet for Healthcare Providers: BankingDealers.co.za This test is not yet approved or cleared by the [place] FDA and has been authorized for detection and/or diagnosis of  SARS-CoV-2 by FDA under an Emergency Use Authorization (EUA).  This EUA will remain in effect (meaning this test can be used) for the duration of the COVID-19 declaration under Section 564(b)(1) of the Act, 21 U.S.C. section 360bbb-3(b)(1), unless the authorization is terminated or revoked sooner. Performed at [facility], [address] [place], [place] [address]   MRSA PCR Screening     Status: None   Collection Time: 11/15/18  6:55 PM  Result Value Ref Range Status   MRSA by PCR NEGATIVE NEGATIVE Final    Comment:        The GeneXpert MRSA Assay (FDA approved for NASAL specimens only), is one component of a comprehensive MRSA colonization surveillance program. It is not intended to diagnose MRSA infection nor to guide or monitor treatment for MRSA infections. Performed at [facility], [address] [place], [place] [address]     RADIOLOGY:  Dg Chest 1 View  Result Date: 11/15/2018 CLINICAL DATA:  Preoperative chest x-ray, history of hip fracture EXAM: CHEST  1 VIEW COMPARISON:  05/29/2018 FINDINGS: Cardiac shadow is mildly enlarged in size. Aortic calcifications are seen. Skin folds are noted over the right lung apex. Patchy atelectatic changes are noted in the right base. Scarring is noted within the left lung stable from the prior study. Degenerative changes of the thoracic spine are seen. No acute bony abnormality is noted. IMPRESSION: Right basilar atelectasis. Right basilar atelectasis as described. Electronically Signed   By: Inez Catalina M.D.   On: 11/15/2018 14:32   Ct Head Wo Contrast  Result Date: 11/15/2018 CLINICAL DATA:  Head trauma secondary to a fall on 11/07/2018. Headache. Bruising at the right temple. EXAM: CT HEAD WITHOUT CONTRAST TECHNIQUE: Contiguous axial images were obtained from the base of the skull through the vertex without intravenous contrast. COMPARISON:  Report of brain MRI dated 12/29/2001 FINDINGS: Brain: There is no acute  intracranial hemorrhage. There is diffuse cerebral cortical atrophy with scattered periventricular white matter lucency consistent with small vessel ischemic disease. Secondary slight dilatation of ventricular system in proportion to the degree of atrophy. Vascular: No hyperdense vessel or unexpected calcification. Skull: Normal. Negative for fracture or focal lesion. Sinuses/Orbits: No significant abnormality. Tiny air-fluid levels in the sphenoid sinus. Other: None IMPRESSION: 1. No acute intracranial abnormality. 2. Diffuse mild atrophy. 3. Scattered periventricular white matter disease most likely small vessel ischemic changes. Electronically Signed   By: Lorriane Shire  M.D.   On: 11/15/2018 16:00   Dg Hip Operative Unilat W Or W/o Pelvis Right  Result Date: 11/16/2018 CLINICAL DATA:  RIGHT hemiarthroplasty.  Hip fracture. EXAM: OPERATIVE RIGHT HIP (WITH PELVIS IF PERFORMED) to VIEWS TECHNIQUE: Fluoroscopic spot image(s) were submitted for interpretation post-operatively. COMPARISON:  Pelvis and RIGHT hip 11/15/2018 FINDINGS: RIGHT hemiarthroplasty. Satisfactory position and alignment. IMPRESSION: Satisfactory postoperative appearance. Electronically Signed   By: Staci Righter M.D.   On: 11/16/2018 15:23   Dg Hip Unilat W Or Wo Pelvis 2-3 Views Right  Result Date: 11/15/2018 CLINICAL DATA:  Right hip pain since a fall 11/04/2018. Initial encounter. EXAM: DG HIP (WITH OR WITHOUT PELVIS) 2-3V RIGHT COMPARISON:  None. FINDINGS: The patient has an acute subcapital fracture of the right hip. No other acute bony or joint abnormality is identified. Lower lumbar degenerative disease is seen. Atherosclerosis noted. IMPRESSION: Acute subcapital fracture right hip. Electronically Signed   By: Inge Rise M.D.   On: 11/15/2018 14:31    EKG:   Orders placed or performed during the hospital encounter of 05/29/18  . EKG 12-Lead  . EKG 12-Lead      Management plans discussed with the patient, family and  they are in agreement.  CODE STATUS:     Code Status Orders  (From admission, onward)         Start     Ordered   11/16/18 0846  Do not attempt resuscitation (DNR)  Continuous    Question Answer Comment  In the event of cardiac or respiratory ARREST Do not call a "code blue"   In the event of cardiac or respiratory ARREST Do not perform Intubation, CPR, defibrillation or ACLS   In the event of cardiac or respiratory ARREST Use medication by any route, position, wound care, and other measures to relive pain and suffering. May use oxygen, suction and manual treatment of airway obstruction as needed for comfort.      11/16/18 0846        Code Status History    Date Active Date Inactive Code Status Order ID Comments User Context   11/15/2018 7001 11/16/2018 0845 Full Code [id number]  Lang Snow, NP ED    Advance Directive Documentation     Most Recent Value  Type of Advance Directive  Healthcare Power of [place], Living will  Pre-existing out of facility DNR order (yellow form or pink MOST form)  -  "MOST" Form in Place?  -      TOTAL TIME TAKING CARE OF THIS PATIENT: 45  minutes.   Note: This dictation was prepared with Dragon dictation along with smaller phrase technology. Any transcriptional errors that result from this process are unintentional.   @MEC @  on 11/18/2018 at 11:59 AM  Between 7am to 6pm - Pager - [phone]  After 6pm go to [email or web address] - password [email or web address] [facility]  [facility]  Office  [phone]  CC: Primary care physician; Harlow Ohms, MD

## 2018-11-18 NOTE — Progress Notes (Signed)
  Subjective:  Patient reports pain as none.  Objective:   VITALS:   Vitals:   11/17/18 0905 11/17/18 1514 11/17/18 1959 11/18/18 0423  BP: 117/71 104/65 95/62 117/77  Pulse: (!) 111 82 100 94  Resp:  18 (!) 21 14  Temp: (!) 97.4 F (36.3 C) 97.8 F (36.6 C) 98.2 F (36.8 C) (!) 97.5 F (36.4 C)  TempSrc: Oral  Oral Oral  SpO2: 92% 96% 96% 94%  Weight:      Height:        PHYSICAL EXAM:  Neurologically intact ABD soft Neurovascular intact Sensation intact distally Intact pulses distally Dorsiflexion/Plantar flexion intact Incision: no drainage No cellulitis present Compartment soft  LABS  Results for orders placed or performed during the hospital encounter of 11/15/18 (from the past 24 hour(s))  Protime-INR     Status: Abnormal   Collection Time: 11/17/18 12:26 PM  Result Value Ref Range   Prothrombin Time 18.7 (H) 11.4 - 15.2 seconds   INR 1.6 (H) 0.8 - 1.2  CBC     Status: Abnormal   Collection Time: 11/18/18  5:08 AM  Result Value Ref Range   WBC 13.3 (H) 4.0 - 10.5 K/uL   RBC 3.88 3.87 - 5.11 MIL/uL   Hemoglobin 10.0 (L) 12.0 - 15.0 g/dL   HCT 33.8 (L) 36.0 - 46.0 %   MCV 87.1 80.0 - 100.0 fL   MCH 25.8 (L) 26.0 - 34.0 pg   MCHC 29.6 (L) 30.0 - 36.0 g/dL   RDW 16.7 (H) 11.5 - 15.5 %   Platelets 439 (H) 150 - 400 K/uL   nRBC 0.0 0.0 - 0.2 %  Protime-INR     Status: Abnormal   Collection Time: 11/18/18  5:08 AM  Result Value Ref Range   Prothrombin Time 19.7 (H) 11.4 - 15.2 seconds   INR 1.7 (H) 0.8 - 1.2    Dg Hip Operative Unilat W Or W/o Pelvis Right  Result Date: 11/16/2018 CLINICAL DATA:  RIGHT hemiarthroplasty.  Hip fracture. EXAM: OPERATIVE RIGHT HIP (WITH PELVIS IF PERFORMED) to VIEWS TECHNIQUE: Fluoroscopic spot image(s) were submitted for interpretation post-operatively. COMPARISON:  Pelvis and RIGHT hip 11/15/2018 FINDINGS: RIGHT hemiarthroplasty. Satisfactory position and alignment. IMPRESSION: Satisfactory postoperative appearance.  Electronically Signed   By: Staci Righter M.D.   On: 11/16/2018 15:23    Assessment/Plan: 2 Days Post-Op   Active Problems:   S/P right hip fracture   Advance diet Up with therapy Discharge to SNF  Continue Warfarin per pharmacy WBAT as tolerated RLE Dressing intact 2 week follow up with Dr. Harlow Mares [phone]   Carlynn Spry , PA-C 11/18/2018, 8:45 AM

## 2018-11-18 NOTE — TOC Transition Note (Signed)
Transition of Care [facility]) - CM/SW Discharge Note   Patient Details  Name: Judith Keller MRN: [id number] Date of Birth: Jan 16, 1925  Transition of Care [facility]) CM/SW Contact:  Zettie Pho, LCSW Phone Number: 11/18/2018, 1:23 PM   Clinical Narrative:   The patient will discharge to [facility] today via non-emergent EMS. The CSW has contacted [facility] and the patient's son, [name], who agree with the discharge. The packet has been delivered to the chart, and the attending RN will insert a signed DNR from the attending MD once available prior to EMS transport. The CSW is signing off. Please consult should needs arise.    Final next level of care: [place] Barriers to Discharge: Continued Medical Work up   Patient Goals and CMS Choice Patient states their goals for this hospitalization and ongoing recovery are:: go back to [place] CMS Medicare.gov Compare Post Acute Care list provided to:: Patient Choice offered to / list presented to : Patient  Discharge Placement                       Discharge Plan and Services   Discharge Planning Services: CM Consult Post Acute Care Choice: Skilled Nursing Facility           [facility]                    Social Determinants of Health (SDOH) Interventions     Readmission Risk Interventions Readmission Risk Prevention Plan 11/17/2018  Transportation Screening Complete  HRI or [place] Complete  Social Work Consult for [place] Planning/Counseling Complete  Medication Review Press photographer) Complete  Some recent data might be hidden

## 2018-11-18 NOTE — Progress Notes (Signed)
Physical Therapy Treatment Patient Details Name: Judith Keller MRN: [id number] DOB: 1925-04-17 Today's Date: 11/18/2018    History of Present Illness 85 y/o female who apparently fell ~2 weeks ago and for a time was able to do some activity at her rehab facility but then started having more pain/issues and was found to have had R hip fx.  S/p R anterior hip replacement 5/7.  Pt with R ankle fx/ORIF 11/19 and per discussion with her rehab facility she is WBAT with the walking boot on.      PT Comments    Patient is a pleasant 85 year old female whose birthday is today. Upon PT entering room patient is eager to participate in therapy. Hip exercise and precaution packet brought and patient educated on importance and performance. Patient demonstrated understanding and performed supine interventions with occasional fatigue with prolonged holds. Patient's lunch arrived ending session however patient continues to demonstrate progress towards goals. Current POC remains appropriate at this time.     Follow Up Recommendations  SNF     Equipment Recommendations  None recommended by PT    Recommendations for Other Services       Precautions / Restrictions Precautions Precautions: Anterior Hip;Fall Restrictions RLE Weight Bearing: Weight bearing as tolerated    Mobility  Bed Mobility Overal bed mobility: Needs Assistance             General bed mobility comments: able to position self in bed for exercises. Did not get EOB this session due to session ending early for lunch.   Transfers                    Ambulation/Gait                 Stairs             Wheelchair Mobility    Modified Rankin (Stroke Patients Only)       Balance                                            Cognition Arousal/Alertness: Awake/alert Behavior During Therapy: WFL for tasks assessed/performed Overall Cognitive Status: Within Functional Limits for tasks  assessed                                 General Comments: eager to participate in therapy. Today is her birthday       Exercises Total Joint Exercises Ankle Circles/Pumps: AROM;Both;15 reps;Supine Quad Sets: Strengthening;Both;10 reps;Supine(3 second holds into PT hand) Gluteal Sets: Strengthening;Both;15 reps;Supine(3 second holds) Towel Squeeze: Strengthening;Both;10 reps;Supine(adduction squeeze: 3 second holds) Short Arc Quad: Strengthening;10 reps;Supine Heel Slides: 10 reps;Strengthening;Both;Supine Hip ABduction/ADduction: Strengthening;Both;10 reps;Supine Straight Leg Raises: AAROM;10 reps;AROM;Both;Supine Other Exercises Other Exercises: patient educated on and performed Anterior hip exercise packet. given packet after demonstrating understanding.     General Comments        Pertinent Vitals/Pain Pain Assessment: 0-10 Pain Score: 2  Pain Location: R hip Pain Descriptors / Indicators: Aching Pain Intervention(s): Monitored during session;Repositioned    Home Living                      Prior Function            PT Goals (current goals can now be found in the care  plan section) Acute Rehab PT Goals Patient Stated Goal: get back to walking well PT Goal Formulation: With patient Time For Goal Achievement: 12/01/18 Potential to Achieve Goals: Good Progress towards PT goals: Progressing toward goals    Frequency    BID      PT Plan Current plan remains appropriate    Co-evaluation              AM-PAC PT "6 Clicks" Mobility   Outcome Measure  Help needed turning from your back to your side while in a flat bed without using bedrails?: A Little Help needed moving from lying on your back to sitting on the side of a flat bed without using bedrails?: A Little Help needed moving to and from a bed to a chair (including a wheelchair)?: A Little Help needed standing up from a chair using your arms (e.g., wheelchair or bedside  chair)?: A Little Help needed to walk in hospital room?: A Lot Help needed climbing 3-5 steps with a railing? : Total 6 Click Score: 15    End of Session Equipment Utilized During Treatment: Gait belt;Oxygen Activity Tolerance: Patient limited by fatigue;Patient tolerated treatment well Patient left: in bed;with call bell/phone within reach;with bed alarm set Nurse Communication: Mobility status PT Visit Diagnosis: Muscle weakness (generalized) (M62.81);Difficulty in walking, not elsewhere classified (R26.2);Pain Pain - Right/Left: Right Pain - part of body: Hip     Time: 0600-4599 PT Time Calculation (min) (ACUTE ONLY): 33 min  Charges:  $Therapeutic Exercise: 23-37 mins                     Janna Arch, PT, DPT    Janna Arch 11/18/2018, 12:00 PM

## 2018-11-18 NOTE — Consult Note (Signed)
ANTICOAGULATION CONSULT NOTE - Follow Up Consult  Pharmacy Consult for Warfarin dosing  Indication: atrial fibrillation  No Known Allergies  Patient Measurements: Height: 5\' 6"  (167.6 cm) Weight: 140 lb (63.5 kg) IBW/kg (Calculated) : 59.3   Vital Signs: Temp: 97.5 F (36.4 C) (05/09 0423) Temp Source: Oral (05/09 0423) BP: 117/77 (05/09 0423) Pulse Rate: 94 (05/09 0423)  Labs: Recent Labs    11/15/18 1312 11/16/18 0837 11/17/18 0418 11/17/18 1226 11/18/18 0508  HGB 12.0  --  11.6*  --  10.0*  HCT 40.5  --  38.9  --  33.8*  PLT 483*  --  481*  --  439*  LABPROT 16.4* 16.9*  --  18.7* 19.7*  INR 1.3* 1.4*  --  1.6* 1.7*  CREATININE 0.94  --  0.85  --   --   TROPONINI <0.03  --   --   --   --     Estimated Creatinine Clearance: 39.5 mL/min (by C-G formula based on SCr of 0.85 mg/dL).   Significant Medications:  Warfarin 4 mg daily -last dose 11/14/18 @2000  Citalopram  Simvastatin  Aspirin  Prednisone  APAP  Assessment: Patient was admitted for a recent hip fracture and is now 1 day post-op. At this time it is appropriate to resume warfarin. INR subtherapeutic and will need to boost her dose.   PMH: HTN, arthirits, hyperlipidemia  DDI (IP meds): Citalopram -may increase risk of bleeding  Simvastatin -may increase bleeding/rhabdomyolysis risks Aspirin -may increase bleeding risk Prednisone - may increase risk of bleeding or diminished effects of warfarin (patient specific) APAP (>2g/day)- may increase INR/risk of bleeding    Date  INR/Dose (mg  5/6 1.3 (none)  5/7 1.4 (none)  5/8 1.6 ( 6 mg)  5/9 1.7   Goal of Therapy:  INR 2-3 Monitor platelets by anticoagulation protocol: Yes   Plan:  Will order warfarin 5 mg x1. Will order and INR with AM labs.   Chinita Greenland PharmD Clinical Pharmacist 11/18/2018

## 2018-11-18 NOTE — Progress Notes (Signed)
Patient is being discharged to [facility] this afternoon. Report called to Butch Penny. NT to prepare patient for transport via EMS. AVS printed and sent in packet. IVs removed.

## 2018-11-18 NOTE — Progress Notes (Signed)
Legal guardian, Kenyatta Gloeckner, notified of pending discharge to [facility].

## 2018-11-20 LAB — SURGICAL PATHOLOGY

## 2019-06-08 ENCOUNTER — Observation Stay
Admit: 2019-06-08 | Discharge: 2019-06-08 | Disposition: A | Discharge status: Home / Self Care | Payer: Medicare Other | Attending: Internal Medicine | Admitting: Internal Medicine

## 2019-06-08 ENCOUNTER — Observation Stay: Payer: Medicare Other

## 2019-06-08 ENCOUNTER — Emergency Department: Payer: Medicare Other

## 2019-06-08 ENCOUNTER — Inpatient Hospital Stay
Admission: EM | Admit: 2019-06-08 | Discharge: 2019-06-11 | DRG: 291 | Disposition: A | Discharge status: Skilled Nursing Facility | Payer: Medicare Other | Source: Skilled Nursing Facility | Attending: Internal Medicine | Admitting: Internal Medicine

## 2019-06-08 ENCOUNTER — Other Ambulatory Visit: Payer: Self-pay

## 2019-06-08 ENCOUNTER — Encounter: Payer: Self-pay | Admitting: Emergency Medicine

## 2019-06-08 DIAGNOSIS — I2510 Atherosclerotic heart disease of native coronary artery without angina pectoris: Secondary | ICD-10-CM

## 2019-06-08 DIAGNOSIS — R0600 Dyspnea, unspecified: Secondary | ICD-10-CM

## 2019-06-08 DIAGNOSIS — R7989 Other specified abnormal findings of blood chemistry: Secondary | ICD-10-CM

## 2019-06-08 DIAGNOSIS — I509 Heart failure, unspecified: Secondary | ICD-10-CM | POA: Diagnosis not present

## 2019-06-08 DIAGNOSIS — E785 Hyperlipidemia, unspecified: Secondary | ICD-10-CM | POA: Diagnosis present

## 2019-06-08 DIAGNOSIS — I5043 Acute on chronic combined systolic (congestive) and diastolic (congestive) heart failure: Secondary | ICD-10-CM | POA: Diagnosis not present

## 2019-06-08 DIAGNOSIS — Z79899 Other long term (current) drug therapy: Secondary | ICD-10-CM

## 2019-06-08 DIAGNOSIS — U071 COVID-19: Secondary | ICD-10-CM | POA: Diagnosis present

## 2019-06-08 DIAGNOSIS — Z7952 Long term (current) use of systemic steroids: Secondary | ICD-10-CM

## 2019-06-08 DIAGNOSIS — I482 Chronic atrial fibrillation, unspecified: Secondary | ICD-10-CM | POA: Diagnosis present

## 2019-06-08 DIAGNOSIS — R0902 Hypoxemia: Secondary | ICD-10-CM | POA: Diagnosis present

## 2019-06-08 DIAGNOSIS — H9190 Unspecified hearing loss, unspecified ear: Secondary | ICD-10-CM | POA: Diagnosis present

## 2019-06-08 DIAGNOSIS — Z9010 Acquired absence of unspecified breast and nipple: Secondary | ICD-10-CM

## 2019-06-08 DIAGNOSIS — Z96641 Presence of right artificial hip joint: Secondary | ICD-10-CM | POA: Diagnosis present

## 2019-06-08 DIAGNOSIS — L8990 Pressure ulcer of unspecified site, unspecified stage: Secondary | ICD-10-CM

## 2019-06-08 DIAGNOSIS — F419 Anxiety disorder, unspecified: Secondary | ICD-10-CM | POA: Diagnosis present

## 2019-06-08 DIAGNOSIS — R0603 Acute respiratory distress: Secondary | ICD-10-CM | POA: Diagnosis present

## 2019-06-08 DIAGNOSIS — Z7901 Long term (current) use of anticoagulants: Secondary | ICD-10-CM

## 2019-06-08 DIAGNOSIS — M353 Polymyalgia rheumatica: Secondary | ICD-10-CM

## 2019-06-08 DIAGNOSIS — M1990 Unspecified osteoarthritis, unspecified site: Secondary | ICD-10-CM | POA: Diagnosis present

## 2019-06-08 DIAGNOSIS — Z66 Do not resuscitate: Secondary | ICD-10-CM | POA: Diagnosis present

## 2019-06-08 DIAGNOSIS — I110 Hypertensive heart disease with heart failure: Secondary | ICD-10-CM | POA: Diagnosis not present

## 2019-06-08 DIAGNOSIS — Z7982 Long term (current) use of aspirin: Secondary | ICD-10-CM

## 2019-06-08 DIAGNOSIS — Z9981 Dependence on supplemental oxygen: Secondary | ICD-10-CM

## 2019-06-08 DIAGNOSIS — F329 Major depressive disorder, single episode, unspecified: Secondary | ICD-10-CM | POA: Diagnosis present

## 2019-06-08 LAB — CBC WITH DIFFERENTIAL/PLATELET
Abs Immature Granulocytes: 0.06 10*3/uL (ref 0.00–0.07)
Basophils Absolute: 0.1 10*3/uL (ref 0.0–0.1)
Basophils Relative: 1 %
Eosinophils Absolute: 0 10*3/uL (ref 0.0–0.5)
Eosinophils Relative: 0 %
HCT: 27.6 % — ABNORMAL LOW (ref 36.0–46.0)
Hemoglobin: 8.1 g/dL — ABNORMAL LOW (ref 12.0–15.0)
Immature Granulocytes: 1 %
Lymphocytes Relative: 3 %
Lymphs Abs: 0.4 10*3/uL — ABNORMAL LOW (ref 0.7–4.0)
MCH: 25.6 pg — ABNORMAL LOW (ref 26.0–34.0)
MCHC: 29.3 g/dL — ABNORMAL LOW (ref 30.0–36.0)
MCV: 87.3 fL (ref 80.0–100.0)
Monocytes Absolute: 0.5 10*3/uL (ref 0.1–1.0)
Monocytes Relative: 4 %
Neutro Abs: 10.9 10*3/uL — ABNORMAL HIGH (ref 1.7–7.7)
Neutrophils Relative %: 91 %
Platelets: 481 10*3/uL — ABNORMAL HIGH (ref 150–400)
RBC: 3.16 MIL/uL — ABNORMAL LOW (ref 3.87–5.11)
RDW: 16.6 % — ABNORMAL HIGH (ref 11.5–15.5)
WBC: 11.9 10*3/uL — ABNORMAL HIGH (ref 4.0–10.5)
nRBC: 0 % (ref 0.0–0.2)

## 2019-06-08 LAB — ECHOCARDIOGRAM COMPLETE: Weight: 2239.87 oz

## 2019-06-08 LAB — BRAIN NATRIURETIC PEPTIDE: B Natriuretic Peptide: 799 pg/mL — ABNORMAL HIGH (ref 0.0–100.0)

## 2019-06-08 LAB — COMPREHENSIVE METABOLIC PANEL
ALT: 17 U/L (ref 0–44)
AST: 19 U/L (ref 15–41)
Albumin: 3.3 g/dL — ABNORMAL LOW (ref 3.5–5.0)
Alkaline Phosphatase: 68 U/L (ref 38–126)
Anion gap: 10 (ref 5–15)
BUN: 20 mg/dL (ref 8–23)
CO2: 26 mmol/L (ref 22–32)
Calcium: 8.5 mg/dL — ABNORMAL LOW (ref 8.9–10.3)
Chloride: 105 mmol/L (ref 98–111)
Creatinine, Ser: 0.74 mg/dL (ref 0.44–1.00)
GFR calc Af Amer: >60 mL/min (ref >60)
GFR calc non Af Amer: >60 mL/min (ref >60)
Glucose, Bld: 122 mg/dL — ABNORMAL HIGH (ref 70–99)
Potassium: 3.5 mmol/L (ref 3.5–5.1)
Sodium: 141 mmol/L (ref 135–145)
Total Bilirubin: 0.8 mg/dL (ref 0.3–1.2)
Total Protein: 6 g/dL — ABNORMAL LOW (ref 6.5–8.1)

## 2019-06-08 LAB — TSH: TSH: 4.813 u[IU]/mL — ABNORMAL HIGH (ref 0.350–4.500)

## 2019-06-08 LAB — MRSA PCR SCREENING: MRSA by PCR: NEGATIVE

## 2019-06-08 LAB — PROTIME-INR
INR: 2.1 — ABNORMAL HIGH (ref 0.8–1.2)
Prothrombin Time: 23.4 seconds — ABNORMAL HIGH (ref 11.4–15.2)

## 2019-06-08 LAB — PROCALCITONIN: Procalcitonin: <0.1 ng/mL

## 2019-06-08 LAB — TROPONIN I (HIGH SENSITIVITY)
Troponin I (High Sensitivity): 9 ng/L (ref <18)
Troponin I (High Sensitivity): 9 ng/L (ref <18)

## 2019-06-08 LAB — APTT: aPTT: 39 seconds — ABNORMAL HIGH (ref 24–36)

## 2019-06-08 MED ORDER — POLYETHYLENE GLYCOL 3350 17 G PO PACK: 17.0000 g | PACK | Freq: Every day | ORAL | Status: DC | PRN

## 2019-06-08 MED ORDER — HYDRALAZINE HCL 20 MG/ML IJ SOLN: 10.0000 mg | INTRAMUSCULAR | Status: DC | PRN

## 2019-06-08 MED ORDER — FUROSEMIDE 10 MG/ML IJ SOLN
40.0000 mg | Freq: Once | INTRAMUSCULAR | Status: AC
  Administered 2019-06-08: 40 mg via INTRAVENOUS
  Filled 2019-06-08: qty 4

## 2019-06-08 MED ORDER — CITALOPRAM HYDROBROMIDE 20 MG PO TABS
10.0000 mg | ORAL_TABLET | Freq: Every day | ORAL | Status: DC
  Administered 2019-06-09 – 2019-06-11 (×3): 10 mg via ORAL
  Filled 2019-06-08 (×3): qty 1

## 2019-06-08 MED ORDER — ALBUTEROL SULFATE HFA 108 (90 BASE) MCG/ACT IN AERS
2.0000 | INHALATION_SPRAY | Freq: Once | RESPIRATORY_TRACT | Status: DC
  Filled 2019-06-08 (×2): qty 6.7

## 2019-06-08 MED ORDER — SENNOSIDES-DOCUSATE SODIUM 8.6-50 MG PO TABS: 2.0000 | ORAL_TABLET | Freq: Every evening | ORAL | Status: DC | PRN

## 2019-06-08 MED ORDER — ASPIRIN EC 81 MG PO TBEC
81.0000 mg | DELAYED_RELEASE_TABLET | Freq: Every day | ORAL | Status: DC
  Administered 2019-06-08 – 2019-06-11 (×4): 81 mg via ORAL
  Filled 2019-06-08 (×4): qty 1

## 2019-06-08 MED ORDER — FUROSEMIDE 10 MG/ML IJ SOLN
40.0000 mg | Freq: Two times a day (BID) | INTRAMUSCULAR | Status: DC
  Administered 2019-06-08 – 2019-06-10 (×4): 40 mg via INTRAVENOUS
  Filled 2019-06-08 (×4): qty 4

## 2019-06-08 MED ORDER — POLYETHYLENE GLYCOL 3350 17 G PO PACK
17.0000 g | PACK | Freq: Every day | ORAL | Status: DC
  Filled 2019-06-08 (×3): qty 1

## 2019-06-08 MED ORDER — ONDANSETRON HCL 4 MG PO TABS: 4.0000 mg | ORAL_TABLET | Freq: Four times a day (QID) | ORAL | Status: DC | PRN

## 2019-06-08 MED ORDER — TRAMADOL HCL 50 MG PO TABS: 50.0000 mg | ORAL_TABLET | Freq: Four times a day (QID) | ORAL | Status: DC | PRN

## 2019-06-08 MED ORDER — ACETAMINOPHEN 325 MG PO TABS
650.0000 mg | ORAL_TABLET | Freq: Four times a day (QID) | ORAL | Status: DC | PRN
  Administered 2019-06-08 – 2019-06-11 (×6): 650 mg via ORAL
  Filled 2019-06-08 (×6): qty 2

## 2019-06-08 MED ORDER — ACETAMINOPHEN 650 MG RE SUPP: 650.0000 mg | Freq: Four times a day (QID) | RECTAL | Status: DC | PRN

## 2019-06-08 MED ORDER — SIMVASTATIN 20 MG PO TABS
20.0000 mg | ORAL_TABLET | Freq: Every day | ORAL | Status: DC
  Administered 2019-06-08: 20 mg via ORAL
  Filled 2019-06-08: qty 1

## 2019-06-08 MED ORDER — DILTIAZEM HCL ER 120 MG PO CP24
120.0000 mg | ORAL_CAPSULE | Freq: Every day | ORAL | Status: DC
  Administered 2019-06-09 – 2019-06-11 (×3): 120 mg via ORAL
  Filled 2019-06-08 (×6): qty 1

## 2019-06-08 MED ORDER — WARFARIN SODIUM 5 MG PO TABS
5.0000 mg | ORAL_TABLET | Freq: Once | ORAL | Status: AC
  Administered 2019-06-08: 5 mg via ORAL
  Filled 2019-06-08: qty 1

## 2019-06-08 MED ORDER — IOHEXOL 350 MG/ML SOLN
75.0000 mL | Freq: Once | INTRAVENOUS | Status: AC | PRN
  Administered 2019-06-08: 75 mL via INTRAVENOUS

## 2019-06-08 MED ORDER — PREDNISONE 10 MG PO TABS
5.0000 mg | ORAL_TABLET | Freq: Every day | ORAL | Status: DC
  Administered 2019-06-09 – 2019-06-11 (×3): 5 mg via ORAL
  Filled 2019-06-08 (×3): qty 1

## 2019-06-08 MED ORDER — MAGNESIUM OXIDE 400 (241.3 MG) MG PO TABS
800.0000 mg | ORAL_TABLET | Freq: Once | ORAL | Status: AC
  Administered 2019-06-08: 800 mg via ORAL
  Filled 2019-06-08: qty 2

## 2019-06-08 MED ORDER — METOPROLOL SUCCINATE ER 50 MG PO TB24
50.0000 mg | ORAL_TABLET | Freq: Every day | ORAL | Status: DC
  Administered 2019-06-09 – 2019-06-11 (×3): 50 mg via ORAL
  Filled 2019-06-08 (×3): qty 1

## 2019-06-08 MED ORDER — WARFARIN SODIUM 5 MG PO TABS: 5.0000 mg | ORAL_TABLET | Freq: Every day | ORAL | Status: DC

## 2019-06-08 MED ORDER — ONDANSETRON HCL 4 MG/2ML IJ SOLN: 4.0000 mg | Freq: Four times a day (QID) | INTRAMUSCULAR | Status: DC | PRN

## 2019-06-08 MED ORDER — POTASSIUM CHLORIDE CRYS ER 20 MEQ PO TBCR
40.0000 meq | EXTENDED_RELEASE_TABLET | Freq: Once | ORAL | Status: AC
  Administered 2019-06-08: 40 meq via ORAL
  Filled 2019-06-08: qty 2

## 2019-06-08 MED ORDER — WARFARIN - PHARMACIST DOSING INPATIENT
Freq: Every day | Status: DC
  Administered 2019-06-09: 17:00:00

## 2019-06-08 NOTE — ED Notes (Signed)
Echo tech at bedside.

## 2019-06-08 NOTE — Progress Notes (Signed)
*  PRELIMINARY RESULTS* Echocardiogram 2D Echocardiogram has been performed.  Judith Keller Judith Keller 06/08/2019, 2:36 PM

## 2019-06-08 NOTE — ED Notes (Signed)
ED TO INPATIENT HANDOFF REPORT  ED Nurse Name and Phone #:  Levada Dy Elizabeth  S Name/Age/Gender Judith Keller 85 y.o. female Room/Bed: ED26A/ED26A  Code Status   Code Status: DNR  Home/SNF/Other Nursing Home Patient oriented to: self, place, time and situation Is this baseline? Yes   Triage Complete: Triage complete  Chief Complaint Shortness of Breath  Triage Note Pt to ED via [facility] from [facility] for Shortness of Breath. Pt states that this has been going on for a few weeks, mostly at night when going back and forth to the bathroom. Pt states that over the past few days it has gotten worse. Pt states that she feels like she needs to catch her breath. Pt is chronically on oxygen at 3 liter via . Pt was positive for COVID 1 month ago. Pt denies chest pain, however, EMS reports that pt was given 1 NTG by facility for chest pressure. Pt is currently denying chest pain or pressure. Pt is in NAD at this time. Pt has hx/o A. Fib   Allergies No Known Allergies  Level of Care/Admitting Diagnosis ED Disposition    ED Disposition Condition [facility] Area: [place] [100120]  Level of Care: Telemetry [5]  Covid Evaluation: Asymptomatic Screening Protocol (No Symptoms)  Diagnosis: Acute exacerbation of CHF (congestive heart failure) [facility]) [id number]  Admitting Physician: Gerlean Ren [facility] [id number]  Attending Physician: Damita Lack [id number]  PT Class (Do Not Modify): Observation [104]  PT Acc Code (Do Not Modify): Observation [10022]       B Medical/Surgery History Past Medical History:  Diagnosis Date  . Atrial fibrillation ([place])   . Heart failure ([place])   . Hyperlipidemia   . Hypertension   . Hypokalemia   . Osteoarthritis    Past Surgical History:  Procedure Laterality Date  . FOOT SURGERY    . HIP ARTHROPLASTY Right 11/16/2018   Procedure: ARTHROPLASTY BIPOLAR HIP (HEMIARTHROPLASTY);  Surgeon: Lovell Sheehan, MD;   Location: [facility] ORS;  Service: Orthopedics;  Laterality: Right;  . MASTECTOMY       A IV Location/Drains/Wounds Patient Lines/Drains/Airways Status   Active Line/Drains/Airways    Name:   Placement date:   Placement time:   Site:   Days:   Peripheral IV 06/08/19 Left Antecubital   06/08/19    1056    Antecubital   less than 1   Incision (Closed) 11/16/18 Hip Right   11/16/18    1509     204   Wound / Incision (Open or Dehisced) 11/15/18 Laceration Ankle Left Skin tear   11/15/18    1806    Ankle   205   Wound / Incision (Open or Dehisced) 11/15/18 Pretibial Left;Anterior Skin tear   11/15/18    1810    Pretibial   205          Intake/Output Last 24 hours  Intake/Output Summary (Last 24 hours) at 06/08/2019 1432 Last data filed at 06/08/2019 1310 Gross per 24 hour  Intake -  Output 800 ml  Net -800 ml    Labs/Imaging Results for orders placed or performed during the hospital encounter of 06/08/19 (from the past 48 hour(s))  CBC with Differential     Status: Abnormal   Collection Time: 06/08/19 11:20 AM  Result Value Ref Range   WBC 11.9 (H) 4.0 - 10.5 K/uL   RBC 3.16 (L) 3.87 - 5.11 MIL/uL   Hemoglobin 8.1 (L) 12.0 -  15.0 g/dL   HCT 27.6 (L) 36.0 - 46.0 %   MCV 87.3 80.0 - 100.0 fL   MCH 25.6 (L) 26.0 - 34.0 pg   MCHC 29.3 (L) 30.0 - 36.0 g/dL   RDW 16.6 (H) 11.5 - 15.5 %   Platelets 481 (H) 150 - 400 K/uL   nRBC 0.0 0.0 - 0.2 %   Neutrophils Relative % 91 %   Neutro Abs 10.9 (H) 1.7 - 7.7 K/uL   Lymphocytes Relative 3 %   Lymphs Abs 0.4 (L) 0.7 - 4.0 K/uL   Monocytes Relative 4 %   Monocytes Absolute 0.5 0.1 - 1.0 K/uL   Eosinophils Relative 0 %   Eosinophils Absolute 0.0 0.0 - 0.5 K/uL   Basophils Relative 1 %   Basophils Absolute 0.1 0.0 - 0.1 K/uL   Immature Granulocytes 1 %   Abs Immature Granulocytes 0.06 0.00 - 0.07 K/uL    Comment: Performed at [facility], [address] [place], [place] [address]  Comprehensive metabolic panel     Status:  Abnormal   Collection Time: 06/08/19 11:20 AM  Result Value Ref Range   Sodium 141 135 - 145 mmol/L   Potassium 3.5 3.5 - 5.1 mmol/L   Chloride 105 98 - 111 mmol/L   CO2 26 22 - 32 mmol/L   Glucose, Bld 122 (H) 70 - 99 mg/dL   BUN 20 8 - 23 mg/dL   Creatinine, Ser 0.74 0.44 - 1.00 mg/dL   Calcium 8.5 (L) 8.9 - 10.3 mg/dL   Total Protein 6.0 (L) 6.5 - 8.1 g/dL   Albumin 3.3 (L) 3.5 - 5.0 g/dL   AST 19 15 - 41 U/L   ALT 17 0 - 44 U/L   Alkaline Phosphatase 68 38 - 126 U/L   Total Bilirubin 0.8 0.3 - 1.2 mg/dL   GFR calc non Af Amer >60 >60 mL/min   GFR calc Af Amer >60 >60 mL/min   Anion gap 10 5 - 15    Comment: Performed at [facility], [address] [place], [place] [address]  Brain natriuretic peptide     Status: Abnormal   Collection Time: 06/08/19 11:20 AM  Result Value Ref Range   B Natriuretic Peptide 799.0 (H) 0.0 - 100.0 pg/mL    Comment: Performed at [facility], [address] [place], [place] [address]  Troponin I (High Sensitivity)     Status: None   Collection Time: 06/08/19 11:20 AM  Result Value Ref Range   Troponin I (High Sensitivity) 9 <18 ng/L    Comment: (NOTE) Elevated high sensitivity troponin I (hsTnI) values and significant  changes across serial measurements may suggest ACS but many other  chronic and acute conditions are known to elevate hsTnI results.  Refer to the "Links" section for chest pain algorithms and additional  guidance. Performed at [facility], [place], [place] [address]   Troponin I (High Sensitivity)     Status: None   Collection Time: 06/08/19  1:07 PM  Result Value Ref Range   Troponin I (High Sensitivity) 9 <18 ng/L    Comment: (NOTE) Elevated high sensitivity troponin I (hsTnI) values and significant  changes across serial measurements may suggest ACS but many other  chronic and acute conditions are known to elevate hsTnI results.  Refer to the "Links" section for chest  pain algorithms and additional  guidance. Performed at [facility], [address] [place], [place] [address]  Dg Chest Portable 1 View  Result Date: 06/08/2019 CLINICAL DATA:  Shortness of breath, few weeks duration. Positive COVID test 1 month ago. EXAM: PORTABLE CHEST 1 VIEW COMPARISON:  Chest x-ray 11/15/2018 FINDINGS: Heart size remains enlarged. Right hemidiaphragm partially obscured by basilar opacity. Bilateral interstitial and alveolar opacities affecting aerated portions of lung. No signs of acute bone finding. Spinal degenerative changes. IMPRESSION: 1. Stable cardiomegaly. 2. Findings that could in the appropriate clinical setting represent heart failure or volume overload with basilar atelectasis. Multifocal pneumonia could potentially have a similar appearance. Clinical correlation suggested. Electronically Signed   By: Zetta Bills M.D.   On: 06/08/2019 11:21    Pending Labs Unresulted Labs (From admission, onward)    Start     Ordered   06/09/19 0500  BMP daily 5 days  Daily,   STAT    Question:  Specimen collection method  Answer:  Lab=Lab collect   06/08/19 1335   06/09/19 0500  Mg Daily 5 days  Daily,   STAT    Question:  Specimen collection method  Answer:  Lab=Lab collect   06/08/19 1335   06/09/19 0500  CBC Daily 5 days  Daily,   STAT    Question:  Specimen collection method  Answer:  Lab=Lab collect   06/08/19 1335   06/08/19 1335  TSH  Once,   STAT    Question:  Specimen collection method  Answer:  Lab=Lab collect   06/08/19 1335   06/08/19 1331  Protime-INR  ONCE - STAT,   STAT     06/08/19 1331          Vitals/Pain Today's Vitals   06/08/19 1200 06/08/19 1230 06/08/19 1300 06/08/19 1330  BP: (!) 154/83 133/88 139/74 (!) 112/95  Pulse: 96 95 97 87  Resp: (!) 35 (!) 23 (!) 27 (!) 27  Temp:      SpO2: 93% 92% 93% 98%  Weight:      PainSc:        Isolation Precautions No active isolations  Medications Medications  albuterol  (VENTOLIN HFA) 108 (90 Base) MCG/ACT inhaler 2 puff (has no administration in time range)  hydrALAZINE (APRESOLINE) injection 10 mg (has no administration in time range)  senna-docusate (Senokot-S) tablet 2 tablet (has no administration in time range)  polyethylene glycol (MIRALAX / GLYCOLAX) packet 17 g (has no administration in time range)  aspirin EC tablet 81 mg (has no administration in time range)  traMADol (ULTRAM) tablet 50 mg (has no administration in time range)  diltiazem (DILACOR XR) 24 hr capsule 120 mg (has no administration in time range)  metoprolol succinate (TOPROL-XL) 24 hr tablet 50 mg (has no administration in time range)  simvastatin (ZOCOR) tablet 20 mg (has no administration in time range)  citalopram (CELEXA) tablet 10 mg (has no administration in time range)  predniSONE (DELTASONE) tablet 5 mg (has no administration in time range)  polyethylene glycol (MIRALAX / GLYCOLAX) packet 17 g (has no administration in time range)  acetaminophen (TYLENOL) tablet 650 mg (has no administration in time range)    Or  acetaminophen (TYLENOL) suppository 650 mg (has no administration in time range)  ondansetron (ZOFRAN) tablet 4 mg (has no administration in time range)    Or  ondansetron (ZOFRAN) injection 4 mg (has no administration in time range)  furosemide (LASIX) injection 40 mg (has no administration in time range)  magnesium oxide (MAG-OX) tablet 800 mg (has no administration in time range)  potassium chloride SA (KLOR-CON)  CR tablet 40 mEq (has no administration in time range)  iohexol (OMNIPAQUE) 350 MG/ML injection 75 mL (has no administration in time range)  furosemide (LASIX) injection 40 mg (40 mg Intravenous Given 06/08/19 1225)    Mobility walks with person assist Low fall risk   Focused Assessments Pulmonary Assessment Handoff:  Lung sounds: L Breath Sounds: Clear R Breath Sounds: Clear, Diminished(RLL diminished ) O2 Device: Nasal Cannula O2 Flow Rate  (L/min): 3 L/min      R Recommendations: See Admitting Provider Note  Report given to:   Additional Notes:  Pt is hard of hearing.

## 2019-06-08 NOTE — ED Triage Notes (Signed)
Pt to ED via [facility] from [facility] for Shortness of Breath. Pt states that this has been going on for a few weeks, mostly at night when going back and forth to the bathroom. Pt states that over the past few days it has gotten worse. Pt states that she feels like she needs to catch her breath. Pt is chronically on oxygen at 3 liter via [place]. Pt was positive for COVID 1 month ago. Pt denies chest pain, however, EMS reports that pt was given 1 NTG by facility for chest pressure. Pt is currently denying chest pain or pressure. Pt is in NAD at this time. Pt has hx/o A. Fib

## 2019-06-08 NOTE — Consult Note (Addendum)
ANTICOAGULATION CONSULT NOTE - Initial Consult  Pharmacy Consult for Warfarin Dosing Indication: atrial fibrillation  No Known Allergies  Patient Measurements: Weight: 139 lb 15.9 oz (63.5 kg) Heparin Dosing Weight: N/A  Vital Signs: Temp: 98.2 F (36.8 C) (11/27 1113) BP: 135/69 (11/27 1400) Pulse Rate: 83 (11/27 1400)  Labs: Recent Labs    06/08/19 1120 06/08/19 1307  HGB 8.1*  --   HCT 27.6*  --   PLT 481*  --   CREATININE 0.74  --   TROPONINIHS 9 9    CrCl cannot be calculated (Unknown ideal weight.).   Medical History: Past Medical History:  Diagnosis Date  . Atrial fibrillation ([place])   . Heart failure ([place])   . Hyperlipidemia   . Hypertension   . Hypokalemia   . Osteoarthritis     Medications:  (Not in a hospital admission)  Scheduled:  . albuterol  2 puff Inhalation Once  . aspirin EC  81 mg Oral Daily  . citalopram  10 mg Oral Daily  . diltiazem  120 mg Oral Daily  . furosemide  40 mg Intravenous Q12H  . magnesium oxide  800 mg Oral Once  . metoprolol succinate  50 mg Oral Daily  . polyethylene glycol  17 g Oral Daily  . potassium chloride  40 mEq Oral Once  . [START ON 06/09/2019] predniSONE  5 mg Oral Q breakfast  . simvastatin  20 mg Oral QHS   Infusions:   PRN: acetaminophen **OR** acetaminophen, hydrALAZINE, ondansetron **OR** ondansetron (ZOFRAN) IV, polyethylene glycol, senna-docusate, traMADol Anti-infectives (From admission, onward)   None      Assessment: Pharmacy has been consulted to dose Warfarin for 85yo patient with shortness of breath. Patient was diagnosed with COVID the beginning of last month. Denies any chest pain currently. Noted to have small drop in hemoglobin from baseline but may be dilutional. Patient currently takes Warfarin 5mg  daily. Last dose taken was 11/26@1900 . Baseline labs have been ordered.  11/27: INR 2.1    Goal of Therapy:  INR 2-3 Monitor platelets by anticoagulation protocol: Yes   Plan:   Patient is currently therapeutic. Will continue with home dose of Warfarin 5mg  x 1 this evening.   Will recheck INR and continue to monitor H&H with AM labs.  Shahiem Bedwell A Teara Duerksen 06/08/2019,2:39 PM

## 2019-06-08 NOTE — Plan of Care (Signed)
Pt newly admitted to in-patient cardiac telemetry unit for CHF.  Coarse crackles auscultated in all lobes. Pt diuresing well from lasix IV.   Problem: Education: Goal: Knowledge of General Education information will improve Description: Including pain rating scale, medication(s)/side effects and non-pharmacologic comfort measures Outcome: Not Progressing   Problem: Health Behavior/Discharge Planning: Goal: Ability to manage health-related needs will improve Outcome: Not Progressing   Problem: Clinical Measurements: Goal: Ability to maintain clinical measurements within normal limits will improve Outcome: Not Progressing Goal: Will remain free from infection Outcome: Not Progressing Goal: Diagnostic test results will improve Outcome: Not Progressing Goal: Respiratory complications will improve Outcome: Not Progressing Goal: Cardiovascular complication will be avoided Outcome: Not Progressing   Problem: Activity: Goal: Risk for activity intolerance will decrease Outcome: Not Progressing   Problem: Nutrition: Goal: Adequate nutrition will be maintained Outcome: Not Progressing   Problem: Coping: Goal: Level of anxiety will decrease Outcome: Not Progressing   Problem: Elimination: Goal: Will not experience complications related to bowel motility Outcome: Not Progressing Goal: Will not experience complications related to urinary retention Outcome: Not Progressing   Problem: Pain Managment: Goal: General experience of comfort will improve Outcome: Not Progressing   Problem: Safety: Goal: Ability to remain free from injury will improve Outcome: Not Progressing   Problem: Skin Integrity: Goal: Risk for impaired skin integrity will decrease Outcome: Not Progressing   Problem: Education: Goal: Ability to demonstrate management of disease process will improve Outcome: Not Progressing Goal: Ability to verbalize understanding of medication therapies will improve Outcome:  Not Progressing Goal: Individualized Educational Video(s) Outcome: Not Progressing   Problem: Activity: Goal: Capacity to carry out activities will improve Outcome: Not Progressing   Problem: Cardiac: Goal: Ability to achieve and maintain adequate cardiopulmonary perfusion will improve Outcome: Not Progressing

## 2019-06-08 NOTE — ED Provider Notes (Signed)
[facility] Emergency Department Provider Note    First MD Initiated Contact with Patient 06/08/19 1055     (approximate)  I have reviewed the triage vital signs and the nursing notes.   HISTORY  Chief Complaint Shortness of Breath    HPI Judith Keller is a 85 y.o. female [facility] with the below listed past medical history presents the ER for shortness of breath and chest pressure.  Patient was diagnosed with Covid the beginning of last month.  She is not having any cough.  Does wear 3 L nasal cannula chronically.  Is having worsening shortness of breath particular with any exertion.  Denies any chest pain right now.  No fevers.  No abdominal pain.    Past Medical History:  Diagnosis Date  . Atrial fibrillation ([place])   . Heart failure ([place])   . Hyperlipidemia   . Hypertension   . Hypokalemia   . Osteoarthritis    No family history on file. Past Surgical History:  Procedure Laterality Date  . FOOT SURGERY    . HIP ARTHROPLASTY Right 11/16/2018   Procedure: ARTHROPLASTY BIPOLAR HIP (HEMIARTHROPLASTY);  Surgeon: Lovell Sheehan, MD;  Location: [facility] ORS;  Service: Orthopedics;  Laterality: Right;  . MASTECTOMY     Patient Active Problem List   Diagnosis Date Noted  . S/P right hip fracture 11/15/2018      Prior to Admission medications   Medication Sig Start Date End Date Taking? Authorizing Provider  acetaminophen (TYLENOL) 325 MG tablet Take 650 mg by mouth every 6 (six) hours as needed for mild pain.   Yes [provider]  albuterol (VENTOLIN HFA) 108 (90 Base) MCG/ACT inhaler Inhale 1 puff into the lungs every 4 (four) hours as needed for wheezing or shortness of breath.   Yes [provider]  aspirin EC 81 MG tablet Take 1 tablet (81 mg total) by mouth daily. 11/18/18 11/18/19 Yes Gouru, Illene Silver, MD  cholecalciferol (VITAMIN D) 25 MCG (1000 UT) tablet Take 2,000 Units by mouth daily.   Yes [provider]  citalopram (CELEXA) 10 MG tablet Take 10 mg by mouth daily.   Yes [provider]  diltiazem (DILACOR XR) 120 MG 24 hr capsule Take 120 mg by mouth daily.   Yes [provider]  fluticasone (FLONASE) 50 MCG/ACT nasal spray Place 2 sprays into both nostrils at bedtime.   Yes [provider]  furosemide (LASIX) 20 MG tablet Take 40 mg by mouth daily.   Yes [provider]  Melatonin 3 MG TABS Take 3 mg by mouth at bedtime.   Yes [provider]  metoprolol succinate (TOPROL-XL) 25 MG 24 hr tablet Take 50 mg by mouth daily. Take with or immediately following a meal.    Yes [provider]  Multiple Vitamin (MULTIVITAMIN WITH MINERALS) TABS tablet Take 1 tablet by mouth daily.   Yes [provider]  polyethylene glycol (MIRALAX / GLYCOLAX) 17 g packet Take 17 g by mouth daily.   Yes [provider]  predniSONE (DELTASONE) 5 MG tablet Take 5 mg by mouth daily with breakfast.   Yes [provider]  simvastatin (ZOCOR) 20 MG tablet Take 20 mg by mouth at bedtime.    Yes [provider]  traMADol (ULTRAM) 50 MG tablet Take 1 tablet (50 mg total) by mouth every 6 (six) hours as needed for moderate pain. 11/18/18  Yes Gouru, Illene Silver, MD  warfarin (COUMADIN) 5  MG tablet Take 1 tablet (5 mg total) by mouth one time only at 6 PM for 4 days. Patient taking differently: Take 5 mg by mouth daily.  11/18/18 06/08/19 Yes GouruIllene Silver, MD    Allergies Patient has no known allergies.    Social History Social History   Tobacco Use  . Smoking status: Never Smoker  . Smokeless tobacco: Never Used  Substance Use Topics  . Alcohol use: Not Currently  . Drug use: Never    Review of Systems Patient denies headaches, rhinorrhea, blurry vision, numbness, shortness of breath, chest pain, edema, cough, abdominal pain, nausea, vomiting, diarrhea, dysuria, fevers, rashes or hallucinations unless otherwise stated above in  HPI. ____________________________________________   PHYSICAL EXAM:  VITAL SIGNS: Vitals:   06/08/19 1230 06/08/19 1300  BP: 133/88 139/74  Pulse: 95 97  Resp: (!) 23 (!) 27  Temp:    SpO2: 92% 93%    Constitutional: Alert and oriented. HOH,  Frail appearing  Eyes: Conjunctivae are normal.  Head: Atraumatic. Nose: No congestion/rhinnorhea. Mouth/Throat: Mucous membranes are moist.   Neck: No stridor. Painless ROM.  Cardiovascular: Normal rate, regular rhythm. Grossly normal heart sounds.  Good peripheral circulation. Respiratory: Normal respiratory effort.  No retractions. Lungs with scattered inspiratory wheeze and diminished posterior breathsounds Gastrointestinal: Soft and nontender. No distention. No abdominal bruits. No CVA tenderness. Genitourinary: deferred Musculoskeletal: No lower extremity tenderness,  2+ BLE edema.  No joint effusions. Neurologic:  Normal speech and language. No gross focal neurologic deficits are appreciated. No facial droop Skin:  Skin is warm, dry and intact. No rash noted. Psychiatric: Mood and affect are normal. Speech and behavior are normal.  ____________________________________________   LABS (all labs ordered are listed, but only abnormal results are displayed)  Results for orders placed or performed during the hospital encounter of 06/08/19 (from the past 24 hour(s))  CBC with Differential     Status: Abnormal   Collection Time: 06/08/19 11:20 AM  Result Value Ref Range   WBC 11.9 (H) 4.0 - 10.5 K/uL   RBC 3.16 (L) 3.87 - 5.11 MIL/uL   Hemoglobin 8.1 (L) 12.0 - 15.0 g/dL   HCT 27.6 (L) 36.0 - 46.0 %   MCV 87.3 80.0 - 100.0 fL   MCH 25.6 (L) 26.0 - 34.0 pg   MCHC 29.3 (L) 30.0 - 36.0 g/dL   RDW 16.6 (H) 11.5 - 15.5 %   Platelets 481 (H) 150 - 400 K/uL   nRBC 0.0 0.0 - 0.2 %   Neutrophils Relative % 91 %   Neutro Abs 10.9 (H) 1.7 - 7.7 K/uL   Lymphocytes Relative 3 %   Lymphs Abs 0.4 (L) 0.7 - 4.0 K/uL   Monocytes Relative 4 %    Monocytes Absolute 0.5 0.1 - 1.0 K/uL   Eosinophils Relative 0 %   Eosinophils Absolute 0.0 0.0 - 0.5 K/uL   Basophils Relative 1 %   Basophils Absolute 0.1 0.0 - 0.1 K/uL   Immature Granulocytes 1 %   Abs Immature Granulocytes 0.06 0.00 - 0.07 K/uL  Comprehensive metabolic panel     Status: Abnormal   Collection Time: 06/08/19 11:20 AM  Result Value Ref Range   Sodium 141 135 - 145 mmol/L   Potassium 3.5 3.5 - 5.1 mmol/L   Chloride 105 98 - 111 mmol/L   CO2 26 22 - 32 mmol/L   Glucose, Bld 122 (H) 70 - 99 mg/dL   BUN 20 8 - 23 mg/dL   Creatinine, Ser  0.74 0.44 - 1.00 mg/dL   Calcium 8.5 (L) 8.9 - 10.3 mg/dL   Total Protein 6.0 (L) 6.5 - 8.1 g/dL   Albumin 3.3 (L) 3.5 - 5.0 g/dL   AST 19 15 - 41 U/L   ALT 17 0 - 44 U/L   Alkaline Phosphatase 68 38 - 126 U/L   Total Bilirubin 0.8 0.3 - 1.2 mg/dL   GFR calc non Af Amer >60 >60 mL/min   GFR calc Af Amer >60 >60 mL/min   Anion gap 10 5 - 15  Brain natriuretic peptide     Status: Abnormal   Collection Time: 06/08/19 11:20 AM  Result Value Ref Range   B Natriuretic Peptide 799.0 (H) 0.0 - 100.0 pg/mL  Troponin I (High Sensitivity)     Status: None   Collection Time: 06/08/19 11:20 AM  Result Value Ref Range   Troponin I (High Sensitivity) 9 <18 ng/L   ____________________________________________  EKG My review and personal interpretation at Time: 11:08   Indication: sob  Rate: 90  Rhythm: afib Axis: normal Other: borderline prolonged qt, no stemi ____________________________________________  RADIOLOGY  I personally reviewed all radiographic images ordered to evaluate for the above acute complaints and reviewed radiology reports and findings.  These findings were personally discussed with the patient.  Please see medical record for radiology report.  ____________________________________________   PROCEDURES  Procedure(s) performed:  Procedures    Critical Care performed:  no ____________________________________________   INITIAL IMPRESSION / ASSESSMENT AND PLAN / ED COURSE  Pertinent labs & imaging results that were available during my care of the patient were reviewed by me and considered in my medical decision making (see chart for details).   DDX: Asthma, copd, CHF, pna, ptx, malignancy, Pe, anemia  SULEY KINCADE is a 85 y.o. who presents to the ED with symptoms as described above.  Patient arrives very tachypneic but without any hypoxia.  Has significant swelling of bilateral lower extremities.  Chest x-ray shows evidence of pulmonary vascular congestion and pulmonary edema presentation concerning for volume overload in the setting of chronic CHF.  Patient given IV Lasix.  Noted to have small drop in her hemoglobin from baseline may be dilutional.  Guaiac test is negative.  No report of any melena or hematochezia recently.  Based on her tachypnea risk factors I do believe that observation in the hospital for aggressive diuresis is clinically indicated.  Have discussed with the patient and available family all diagnostics and treatments performed thus far and all questions were answered to the best of my ability. The patient demonstrates understanding and agreement with plan.      The patient was evaluated in Emergency Department today for the symptoms described in the history of present illness. He/she was evaluated in the context of the global COVID-19 pandemic, which necessitated consideration that the patient might be at risk for infection with the SARS-CoV-2 virus that causes COVID-19. Institutional protocols and algorithms that pertain to the evaluation of patients at risk for COVID-19 are in a state of rapid change based on information released by regulatory bodies including the CDC and federal and state organizations. These policies and algorithms were followed during the patient's care in the ED.  As part of my medical decision making, I reviewed the  following data within the [place] notes reviewed and incorporated, Labs reviewed, notes from prior ED visits and [place] Controlled Substance Database   ____________________________________________   FINAL CLINICAL IMPRESSION(S) / ED DIAGNOSES  Final diagnoses:  Acute on chronic congestive heart failure, unspecified heart failure type (HCC)  Dyspnea, unspecified type      NEW MEDICATIONS STARTED DURING THIS VISIT:  New Prescriptions   No medications on file     Note:  This document was prepared using Dragon voice recognition software and may include unintentional dictation errors.    Merlyn Lot, MD 06/08/19 1323

## 2019-06-08 NOTE — H&P (Addendum)
History and Physical    Judith Keller [id number] DOB: [birth year] DOA: 06/08/2019  PCP: Harlow Ohms, MD Patient coming from: [facility]  Chief Complaint: Dyspnea on exertion and chest pressure  HPI: Judith Keller is a 85 y.o. female with medical history significant of past medical history of atrial fibrillation on Coumadin, anxiety, depression, CAD, polymyalgia rheumatica comes to the hospital for evaluation of dyspnea on exertion and atypical chest discomfort.  Patient states she was diagnosed of Covid last month but had been doing okay but over the course the last few days she has noticed progressive dyspnea on exertion and some substernal chest discomfort.  In the ER her basic labs were unremarkable except her BNP was elevated at 799.  2 sets of cardiac enzymes were negative.  EKG showed rate controlled atrial fibrillation.  Although she was saturating greater than 90% on room air she was clearly dyspneic on physical examination and also concerns for pulmonary infiltrate cardiomegaly.  Chest pain had resolved in the ER.   Review of Systems: As per HPI otherwise 10 point review of systems negative.  Review of Systems Otherwise negative except as per HPI, including: General: Denies fever, chills, night sweats or unintended weight loss. Resp: Denies hemoptysis Cardiac: Denies  palpitations, orthopnea, paroxysmal nocturnal dyspnea. GI: Denies abdominal pain, nausea, vomiting, diarrhea or constipation GU: Denies dysuria, frequency, hesitancy or incontinence MS: Denies muscle aches, joint pain or swelling Neuro: Denies headache, neurologic deficits (focal weakness, numbness, tingling), abnormal gait Psych: Denies anxiety, depression, SI/HI/AVH Skin: Denies new rashes or lesions ID: Denies sick contacts, exotic exposures, travel  Past Medical History:  Diagnosis Date  . Atrial fibrillation ([place])   . Heart failure ([place])   . Hyperlipidemia   . Hypertension   . Hypokalemia   .  Osteoarthritis     Past Surgical History:  Procedure Laterality Date  . FOOT SURGERY    . HIP ARTHROPLASTY Right 11/16/2018   Procedure: ARTHROPLASTY BIPOLAR HIP (HEMIARTHROPLASTY);  Surgeon: Lovell Sheehan, MD;  Location: [facility] ORS;  Service: Orthopedics;  Laterality: Right;  . MASTECTOMY      SOCIAL HISTORY:  reports that she has never smoked. She has never used smokeless tobacco. She reports previous alcohol use. She reports that she does not use drugs.  No Known Allergies  FAMILY HISTORY: No family history on file.   Prior to Admission medications   Medication Sig Start Date End Date Taking? Authorizing Provider  acetaminophen (TYLENOL) 325 MG tablet Take 650 mg by mouth every 6 (six) hours as needed for mild pain.   Yes [provider]  albuterol (VENTOLIN HFA) 108 (90 Base) MCG/ACT inhaler Inhale 1 puff into the lungs every 4 (four) hours as needed for wheezing or shortness of breath.   Yes [provider]  aspirin EC 81 MG tablet Take 1 tablet (81 mg total) by mouth daily. 11/18/18 11/18/19 Yes Gouru, Illene Silver, MD  cholecalciferol (VITAMIN D) 25 MCG (1000 UT) tablet Take 2,000 Units by mouth daily.   Yes [provider]  citalopram (CELEXA) 10 MG tablet Take 10 mg by mouth daily.   Yes [provider]  diltiazem (DILACOR XR) 120 MG 24 hr capsule Take 120 mg by mouth daily.   Yes [provider]  fluticasone (FLONASE) 50 MCG/ACT nasal spray Place 2 sprays into both nostrils at bedtime.   Yes [provider]  furosemide (LASIX) 20 MG tablet Take 40 mg by mouth daily.   Yes [provider]  Melatonin 3 MG TABS Take 3 mg by mouth at bedtime.   Yes [provider]  metoprolol succinate (TOPROL-XL) 25 MG 24 hr tablet Take 50 mg by mouth daily. Take with or immediately following a meal.    Yes [provider]  Multiple Vitamin (MULTIVITAMIN WITH MINERALS) TABS tablet Take 1 tablet by mouth daily.   Yes [provider]  polyethylene glycol (MIRALAX / GLYCOLAX) 17 g packet Take 17 g by mouth daily.   Yes [provider]  predniSONE (DELTASONE) 5 MG tablet Take 5 mg by mouth daily with breakfast.   Yes [provider]  simvastatin (ZOCOR) 20 MG tablet Take 20 mg by mouth at bedtime.    Yes [provider]  traMADol (ULTRAM) 50 MG tablet Take 1 tablet (50 mg total) by mouth every 6 (six) hours as needed for moderate pain. 11/18/18  Yes Gouru, Illene Silver, MD  warfarin (COUMADIN) 5 MG tablet Take 1 tablet (5 mg total) by mouth one time only at 6 PM for 4 days. Patient taking differently: Take 5 mg by mouth daily.  11/18/18 06/08/19 Yes Nicholes Mango, MD    Physical Exam: Vitals:   06/08/19 1200 06/08/19 1230 06/08/19 1300 06/08/19 1330  BP: (!) 154/83 133/88 139/74 (!) 112/95  Pulse: 96 95 97 87  Resp: (!) 35 (!) 23 (!) 27 (!) 27  Temp:      SpO2: 93% 92% 93% 98%  Weight:          Constitutional: Slight tachypnea; 3L [place] Eyes: PERRL, lids and conjunctivae normal ENMT: Mucous membranes are moist. Posterior pharynx clear of any exudate or lesions.Normal dentition.  Neck: normal, supple, no masses, no thyromegaly Respiratory: Bibasilar crackles Cardiovascular: Regular rate and rhythm, no murmurs / rubs / gallops. No extremity edema. 2+ pedal pulses. No carotid bruits.  Abdomen: no tenderness, no masses palpated. No hepatosplenomegaly. Bowel sounds positive.  Musculoskeletal: no clubbing / cyanosis. No joint deformity upper and lower extremities. Good ROM, no contractures. Normal muscle tone.  Skin: no rashes, lesions, ulcers. No induration Neurologic: CN 2-12 grossly intact. Sensation intact, DTR normal. Strength 5/5 in all 4.  Psychiatric: Normal judgment and insight. Alert and oriented x 3. Normal mood.     Labs on Admission: I have personally reviewed following labs and imaging studies  CBC: Recent Labs  Lab 06/08/19 1120  WBC 11.9*  NEUTROABS 10.9*  HGB 8.1*   HCT 27.6*  MCV 87.3  PLT 123XX123*   Basic Metabolic Panel: Recent Labs  Lab 06/08/19 1120  NA 141  K 3.5  CL 105  CO2 26  GLUCOSE 122*  BUN 20  CREATININE 0.74  CALCIUM 8.5*   GFR: CrCl cannot be calculated (Unknown ideal weight.). Liver Function Tests: Recent Labs  Lab 06/08/19 1120  AST 19  ALT 17  ALKPHOS 68  BILITOT 0.8  PROT 6.0*  ALBUMIN 3.3*   No results for input(s): LIPASE, AMYLASE in the last 168 hours. No results for input(s): AMMONIA in the last 168 hours. Coagulation Profile: No results for input(s): INR, PROTIME in the last 168 hours. Cardiac Enzymes: No results for input(s): CKTOTAL, CKMB, CKMBINDEX, TROPONINI in the last 168 hours. BNP (last 3 results) No results for input(s): PROBNP in the last 8760 hours. HbA1C: No results for input(s): HGBA1C in the last 72 hours. CBG: No results for input(s): GLUCAP in the last 168 hours. Lipid Profile: No results for input(s): CHOL, HDL, LDLCALC, TRIG, CHOLHDL, LDLDIRECT in the last 72  hours. Thyroid Function Tests: No results for input(s): TSH, T4TOTAL, FREET4, T3FREE, THYROIDAB in the last 72 hours. Anemia Panel: No results for input(s): VITAMINB12, FOLATE, FERRITIN, TIBC, IRON, RETICCTPCT in the last 72 hours. Urine analysis: No results found for: COLORURINE, APPEARANCEUR, LABSPEC, PHURINE, GLUCOSEU, HGBUR, BILIRUBINUR, KETONESUR, PROTEINUR, UROBILINOGEN, NITRITE, LEUKOCYTESUR Sepsis Labs: !!!!!!!!!!!!!!!!!!!!!!!!!!!!!!!!!!!!!!!!!!!! @LABRCNTIP (procalcitonin:4,lacticidven:4) )No results found for this or any previous visit (from the past 240 hour(s)).   Radiological Exams on Admission: Dg Chest Portable 1 View  Result Date: 06/08/2019 CLINICAL DATA:  Shortness of breath, few weeks duration. Positive COVID test 1 month ago. EXAM: PORTABLE CHEST 1 VIEW COMPARISON:  Chest x-ray 11/15/2018 FINDINGS: Heart size remains enlarged. Right hemidiaphragm partially obscured by basilar opacity. Bilateral interstitial  and alveolar opacities affecting aerated portions of lung. No signs of acute bone finding. Spinal degenerative changes. IMPRESSION: 1. Stable cardiomegaly. 2. Findings that could in the appropriate clinical setting represent heart failure or volume overload with basilar atelectasis. Multifocal pneumonia could potentially have a similar appearance. Clinical correlation suggested. Electronically Signed   By: Zetta Bills M.D.   On: 06/08/2019 11:21     All images have been reviewed by me personally.  EKG: Independently reviewed.  Atrial fibrillation  Assessment/Plan Principal Problem:   Acute exacerbation of CHF (congestive heart failure) (HCC) Active Problems:   Acute respiratory distress   Hypoxia   Elevated brain natriuretic peptide (BNP) level   Atrial fibrillation, chronic (HCC)   Polymyalgia rheumatica (HCC)   CAD (coronary artery disease)    Dyspnea on exertion Congestive heart failure, acute, unspecified, class III Elevated BNP -Admit to telemetry.  Echocardiogram ordered -Strict input and output, fluid restriction, Lasix IV 40 mg twice daily -Monitor urine output.  Supportive care. -Procalcitonin negative -Echocardiogram 2016 showed ejection fraction greater than 55% -Monitor electrolytes -Given recent COVID-19 and intermittent subtherapeutic INR.    History of atrial fibrillation History of CAD -Continue diltiazem, metoprolol.  Pharmacy to dose Coumadin -Aspirin and statin  Chronic hypoxia on 3 L nasal cannula -Chronic fibrotic disease.  As needed bronchodilators.  Polymyalgia rheumatica -Prednisone 5 mg daily  Depression/anxiety -Daily Celexa  DVT prophylaxis: Coumadin Code Status: DNR Family Communication: None at bedside Disposition Plan: To be determined Consults called: None Admission status: Admit for overnight observation and IV diuretics.  In the meantime will get echocardiogram.   Time Spent: 65 minutes.  >50% of the time was devoted to  discussing the patients care, assessment, plan and disposition with other care givers along with counseling the patient about the risks and benefits of treatment.    Ankit Arsenio Loader MD [facility]  If 7PM-7AM, please contact night-coverage   06/08/2019, 2:07 PM

## 2019-06-09 DIAGNOSIS — M1990 Unspecified osteoarthritis, unspecified site: Secondary | ICD-10-CM | POA: Diagnosis present

## 2019-06-09 DIAGNOSIS — I509 Heart failure, unspecified: Secondary | ICD-10-CM

## 2019-06-09 DIAGNOSIS — I5033 Acute on chronic diastolic (congestive) heart failure: Secondary | ICD-10-CM | POA: Diagnosis not present

## 2019-06-09 DIAGNOSIS — Z8616 Personal history of COVID-19: Secondary | ICD-10-CM

## 2019-06-09 DIAGNOSIS — F419 Anxiety disorder, unspecified: Secondary | ICD-10-CM | POA: Diagnosis present

## 2019-06-09 DIAGNOSIS — F329 Major depressive disorder, single episode, unspecified: Secondary | ICD-10-CM | POA: Diagnosis present

## 2019-06-09 DIAGNOSIS — L8990 Pressure ulcer of unspecified site, unspecified stage: Secondary | ICD-10-CM | POA: Insufficient documentation

## 2019-06-09 DIAGNOSIS — R0902 Hypoxemia: Secondary | ICD-10-CM | POA: Diagnosis present

## 2019-06-09 DIAGNOSIS — Z96641 Presence of right artificial hip joint: Secondary | ICD-10-CM | POA: Diagnosis present

## 2019-06-09 DIAGNOSIS — H9190 Unspecified hearing loss, unspecified ear: Secondary | ICD-10-CM | POA: Diagnosis present

## 2019-06-09 DIAGNOSIS — I2510 Atherosclerotic heart disease of native coronary artery without angina pectoris: Secondary | ICD-10-CM

## 2019-06-09 DIAGNOSIS — I5041 Acute combined systolic (congestive) and diastolic (congestive) heart failure: Secondary | ICD-10-CM | POA: Diagnosis not present

## 2019-06-09 DIAGNOSIS — Z7952 Long term (current) use of systemic steroids: Secondary | ICD-10-CM | POA: Diagnosis not present

## 2019-06-09 DIAGNOSIS — E785 Hyperlipidemia, unspecified: Secondary | ICD-10-CM | POA: Diagnosis present

## 2019-06-09 DIAGNOSIS — M353 Polymyalgia rheumatica: Secondary | ICD-10-CM

## 2019-06-09 DIAGNOSIS — Z9010 Acquired absence of unspecified breast and nipple: Secondary | ICD-10-CM | POA: Diagnosis not present

## 2019-06-09 DIAGNOSIS — Z7901 Long term (current) use of anticoagulants: Secondary | ICD-10-CM | POA: Diagnosis not present

## 2019-06-09 DIAGNOSIS — Z9981 Dependence on supplemental oxygen: Secondary | ICD-10-CM | POA: Diagnosis not present

## 2019-06-09 DIAGNOSIS — Z66 Do not resuscitate: Secondary | ICD-10-CM | POA: Diagnosis present

## 2019-06-09 DIAGNOSIS — I482 Chronic atrial fibrillation, unspecified: Secondary | ICD-10-CM

## 2019-06-09 DIAGNOSIS — R0603 Acute respiratory distress: Secondary | ICD-10-CM | POA: Diagnosis present

## 2019-06-09 DIAGNOSIS — Z7982 Long term (current) use of aspirin: Secondary | ICD-10-CM | POA: Diagnosis not present

## 2019-06-09 DIAGNOSIS — I110 Hypertensive heart disease with heart failure: Secondary | ICD-10-CM | POA: Diagnosis present

## 2019-06-09 DIAGNOSIS — U071 COVID-19: Secondary | ICD-10-CM | POA: Diagnosis present

## 2019-06-09 DIAGNOSIS — Z79899 Other long term (current) drug therapy: Secondary | ICD-10-CM | POA: Diagnosis not present

## 2019-06-09 DIAGNOSIS — I5043 Acute on chronic combined systolic (congestive) and diastolic (congestive) heart failure: Secondary | ICD-10-CM | POA: Diagnosis present

## 2019-06-09 HISTORY — DX: Personal history of COVID-19: Z86.16

## 2019-06-09 LAB — GLUCOSE, CAPILLARY: Glucose-Capillary: 87 mg/dL (ref 70–99)

## 2019-06-09 LAB — BASIC METABOLIC PANEL
Anion gap: 8 (ref 5–15)
BUN: 18 mg/dL (ref 8–23)
CO2: 29 mmol/L (ref 22–32)
Calcium: 8.5 mg/dL — ABNORMAL LOW (ref 8.9–10.3)
Chloride: 104 mmol/L (ref 98–111)
Creatinine, Ser: 0.65 mg/dL (ref 0.44–1.00)
GFR calc Af Amer: >60 mL/min (ref >60)
GFR calc non Af Amer: >60 mL/min (ref >60)
Glucose, Bld: 96 mg/dL (ref 70–99)
Potassium: 3.4 mmol/L — ABNORMAL LOW (ref 3.5–5.1)
Sodium: 141 mmol/L (ref 135–145)

## 2019-06-09 LAB — MAGNESIUM: Magnesium: 2.1 mg/dL (ref 1.7–2.4)

## 2019-06-09 LAB — CBC
HCT: 29 % — ABNORMAL LOW (ref 36.0–46.0)
Hemoglobin: 8.8 g/dL — ABNORMAL LOW (ref 12.0–15.0)
MCH: 26.3 pg (ref 26.0–34.0)
MCHC: 30.3 g/dL (ref 30.0–36.0)
MCV: 86.6 fL (ref 80.0–100.0)
Platelets: 458 10*3/uL — ABNORMAL HIGH (ref 150–400)
RBC: 3.35 MIL/uL — ABNORMAL LOW (ref 3.87–5.11)
RDW: 16.6 % — ABNORMAL HIGH (ref 11.5–15.5)
WBC: 8.9 10*3/uL (ref 4.0–10.5)
nRBC: 0 % (ref 0.0–0.2)

## 2019-06-09 LAB — PROTIME-INR
INR: 2.2 — ABNORMAL HIGH (ref 0.8–1.2)
Prothrombin Time: 24.4 seconds — ABNORMAL HIGH (ref 11.4–15.2)

## 2019-06-09 MED ORDER — WARFARIN SODIUM 5 MG PO TABS
5.0000 mg | ORAL_TABLET | Freq: Every day | ORAL | Status: DC
  Administered 2019-06-09: 5 mg via ORAL
  Filled 2019-06-09 (×3): qty 1

## 2019-06-09 MED ORDER — ATORVASTATIN CALCIUM 10 MG PO TABS
10.0000 mg | ORAL_TABLET | Freq: Every day | ORAL | Status: DC
  Administered 2019-06-09 – 2019-06-10 (×2): 10 mg via ORAL
  Filled 2019-06-09 (×2): qty 1

## 2019-06-09 MED ORDER — POTASSIUM CHLORIDE CRYS ER 20 MEQ PO TBCR
40.0000 meq | EXTENDED_RELEASE_TABLET | Freq: Once | ORAL | Status: AC
  Administered 2019-06-09: 40 meq via ORAL
  Filled 2019-06-09: qty 2

## 2019-06-09 NOTE — Consult Note (Signed)
ANTICOAGULATION CONSULT NOTE   Pharmacy Consult for Warfarin Dosing Indication: atrial fibrillation  No Known Allergies  Patient Measurements: Height: 5\' 5"  (165.1 cm) Weight: 132 lb 1.6 oz (59.9 kg) IBW/kg (Calculated) : 57 Heparin Dosing Weight: N/A  Vital Signs: Temp: 98.5 F (36.9 C) (11/28 0808) Temp Source: Oral (11/28 0808) BP: 116/76 (11/28 0808) Pulse Rate: 96 (11/28 0808)  Labs: Recent Labs    06/08/19 1120 06/08/19 1307 06/08/19 1549 06/09/19 0605  HGB 8.1*  --   --  8.8*  HCT 27.6*  --   --  29.0*  PLT 481*  --   --  458*  APTT  --   --  39*  --   LABPROT  --   --  23.4* 24.4*  INR  --   --  2.1* 2.2*  CREATININE 0.74  --   --  0.65  TROPONINIHS 9 9  --   --     Estimated Creatinine Clearance: 40.4 mL/min (by C-G formula based on SCr of 0.65 mg/dL).   Medical History: Past Medical History:  Diagnosis Date  . Atrial fibrillation ([place])   . Heart failure ([place])   . Hyperlipidemia   . Hypertension   . Hypokalemia   . Osteoarthritis     Medications:  Medications Prior to Admission  Medication Sig Dispense Refill Last Dose  . acetaminophen (TYLENOL) 325 MG tablet Take 650 mg by mouth every 6 (six) hours as needed for mild pain.   prn at prn  . albuterol (VENTOLIN HFA) 108 (90 Base) MCG/ACT inhaler Inhale 1 puff into the lungs every 4 (four) hours as needed for wheezing or shortness of breath.   prn at prn  . aspirin EC 81 MG tablet Take 1 tablet (81 mg total) by mouth daily. 150 tablet 2 06/08/2019 at 0900  . cholecalciferol (VITAMIN D) 25 MCG (1000 UT) tablet Take 2,000 Units by mouth daily.   06/08/2019 at 0900  . citalopram (CELEXA) 10 MG tablet Take 10 mg by mouth daily.   06/08/2019 at 0900  . diltiazem (DILACOR XR) 120 MG 24 hr capsule Take 120 mg by mouth daily.   06/08/2019 at 0900  . fluticasone (FLONASE) 50 MCG/ACT nasal spray Place 2 sprays into both nostrils at bedtime.   prn at prn  . furosemide (LASIX) 20 MG tablet Take 40 mg by mouth  daily.   06/08/2019 at 0800  . Melatonin 3 MG TABS Take 3 mg by mouth at bedtime.   06/07/2019 at 2000  . metoprolol succinate (TOPROL-XL) 25 MG 24 hr tablet Take 50 mg by mouth daily. Take with or immediately following a meal.    06/08/2019 at 0800  . Multiple Vitamin (MULTIVITAMIN WITH MINERALS) TABS tablet Take 1 tablet by mouth daily.   06/08/2019 at 0900  . polyethylene glycol (MIRALAX / GLYCOLAX) 17 g packet Take 17 g by mouth daily.   prn at prn  . predniSONE (DELTASONE) 5 MG tablet Take 5 mg by mouth daily with breakfast.   06/08/2019 at 0900  . simvastatin (ZOCOR) 20 MG tablet Take 20 mg by mouth at bedtime.    06/07/2019 at 2000  . traMADol (ULTRAM) 50 MG tablet Take 1 tablet (50 mg total) by mouth every 6 (six) hours as needed for moderate pain. 20 tablet 0 prn at prn  . warfarin (COUMADIN) 5 MG tablet Take 1 tablet (5 mg total) by mouth one time only at 6 PM for 4 days. (Patient taking differently: Take 5  mg by mouth daily. ) 4 tablet 0 06/07/2019 at 1900   Scheduled:  . albuterol  2 puff Inhalation Once  . aspirin EC  81 mg Oral Daily  . atorvastatin  10 mg Oral QHS  . citalopram  10 mg Oral Daily  . diltiazem  120 mg Oral Daily  . furosemide  40 mg Intravenous Q12H  . metoprolol succinate  50 mg Oral Daily  . polyethylene glycol  17 g Oral Daily  . predniSONE  5 mg Oral Q breakfast  . Warfarin - Pharmacist Dosing Inpatient   Does not apply q1800   Infusions:   PRN: acetaminophen **OR** acetaminophen, hydrALAZINE, ondansetron **OR** ondansetron (ZOFRAN) IV, polyethylene glycol, senna-docusate, traMADol Anti-infectives (From admission, onward)   None      Assessment: Pharmacy has been consulted to dose Warfarin for 85yo patient with shortness of breath. Patient was diagnosed with COVID the beginning of last month. Denies any chest pain currently. Noted to have small drop in hemoglobin from baseline but may be dilutional. Patient currently takes Warfarin 5mg  daily. Last dose  taken was 11/26@1900 . Baseline labs have been ordered.  11/27: INR 2.1   5 mg 11/28: INR 2.2   Goal of Therapy:  INR 2-3 Monitor platelets by anticoagulation protocol: Yes   Plan:  Patient is currently therapeutic. Will continue with home dose of Warfarin 5mg   Will recheck INR and continue to monitor H&H with AM labs.  Nicholus Chandran A 06/09/2019,12:07 PM

## 2019-06-09 NOTE — Plan of Care (Signed)
  Problem: Health Behavior/Discharge Planning: Goal: Ability to manage health-related needs will improve Outcome: Progressing   Problem: Clinical Measurements: Goal: Ability to maintain clinical measurements within normal limits will improve Outcome: Progressing Goal: Respiratory complications will improve Outcome: Progressing   Problem: Activity: Goal: Risk for activity intolerance will decrease Outcome: Progressing   Problem: Elimination: Goal: Will not experience complications related to urinary retention Outcome: Progressing

## 2019-06-09 NOTE — Consult Note (Signed)
Cardiology Consultation:   Patient ID: Judith Keller MRN: [id number]; DOB: 11-24-1924  Admit date: 06/08/2019 Date of Consult: 06/09/2019  Primary Care Provider: Harlow Ohms, MD Primary Cardiologist: No primary care provider on file. [place] Primary Electrophysiologist:  None    Patient Profile:   Judith Keller is a 85 y.o. female with a hx of atrial fib who is being seen today for the evaluation of chest pressure and sob at the request of Dr. Reesa Chew.  History of Present Illness:   Judith Keller is a 85 yo woman with a h/o atrial fib on coumadin. She has PMR. She notes progressive dyspnea on exertion over the past 2 weeks, especially over the last couple of days prompting her presentation. She ahs been diuresed. He dyspnea is improved. She also had some chest pressure which has resolved. "I feel better." The patient denies dietary or medical non-compliance. Her BNP was elevated on admit.   Heart Pathway Score:     Past Medical History:  Diagnosis Date  . Atrial fibrillation ([place])   . Heart failure ([place])   . Hyperlipidemia   . Hypertension   . Hypokalemia   . Osteoarthritis     Past Surgical History:  Procedure Laterality Date  . FOOT SURGERY    . HIP ARTHROPLASTY Right 11/16/2018   Procedure: ARTHROPLASTY BIPOLAR HIP (HEMIARTHROPLASTY);  Surgeon: Lovell Sheehan, MD;  Location: [facility] ORS;  Service: Orthopedics;  Laterality: Right;  . MASTECTOMY       Home Medications:  Prior to Admission medications   Medication Sig Start Date End Date Taking? Authorizing Provider  acetaminophen (TYLENOL) 325 MG tablet Take 650 mg by mouth every 6 (six) hours as needed for mild pain.   Yes [provider]  albuterol (VENTOLIN HFA) 108 (90 Base) MCG/ACT inhaler Inhale 1 puff into the lungs every 4 (four) hours as needed for wheezing or shortness of breath.   Yes [provider]  aspirin EC 81 MG tablet Take 1 tablet (81 mg total) by mouth daily. 11/18/18 11/18/19 Yes Gouru, Illene Silver, MD   cholecalciferol (VITAMIN D) 25 MCG (1000 UT) tablet Take 2,000 Units by mouth daily.   Yes [provider]  citalopram (CELEXA) 10 MG tablet Take 10 mg by mouth daily.   Yes [provider]  diltiazem (DILACOR XR) 120 MG 24 hr capsule Take 120 mg by mouth daily.   Yes [provider]  fluticasone (FLONASE) 50 MCG/ACT nasal spray Place 2 sprays into both nostrils at bedtime.   Yes [provider]  furosemide (LASIX) 20 MG tablet Take 40 mg by mouth daily.   Yes [provider]  Melatonin 3 MG TABS Take 3 mg by mouth at bedtime.   Yes [provider]  metoprolol succinate (TOPROL-XL) 25 MG 24 hr tablet Take 50 mg by mouth daily. Take with or immediately following a meal.    Yes [provider]  Multiple Vitamin (MULTIVITAMIN WITH MINERALS) TABS tablet Take 1 tablet by mouth daily.   Yes [provider]  polyethylene glycol (MIRALAX / GLYCOLAX) 17 g packet Take 17 g by mouth daily.   Yes [provider]  predniSONE (DELTASONE) 5 MG tablet Take 5 mg by mouth daily with breakfast.   Yes [provider]  simvastatin (ZOCOR) 20 MG tablet Take 20 mg by mouth at bedtime.    Yes [provider]  traMADol (ULTRAM) 50 MG tablet Take 1 tablet (50 mg total) by mouth every 6 (  six) hours as needed for moderate pain. 11/18/18  Yes Gouru, Illene Silver, MD  warfarin (COUMADIN) 5 MG tablet Take 1 tablet (5 mg total) by mouth one time only at 6 PM for 4 days. Patient taking differently: Take 5 mg by mouth daily.  11/18/18 06/08/19 Yes GouruIllene Silver, MD    Inpatient Medications: Scheduled Meds: . albuterol  2 puff Inhalation Once  . aspirin EC  81 mg Oral Daily  . atorvastatin  10 mg Oral QHS  . citalopram  10 mg Oral Daily  . diltiazem  120 mg Oral Daily  . furosemide  40 mg Intravenous Q12H  . metoprolol succinate  50 mg Oral Daily  . polyethylene glycol  17 g Oral Daily  . predniSONE  5 mg Oral Q breakfast  . Warfarin -  Pharmacist Dosing Inpatient   Does not apply q1800   Continuous Infusions:  PRN Meds: acetaminophen **OR** acetaminophen, hydrALAZINE, ondansetron **OR** ondansetron (ZOFRAN) IV, polyethylene glycol, senna-docusate, traMADol  Allergies:   No Known Allergies  Social History:   Social History   Socioeconomic History  . Marital status: Widowed    Spouse name: Not on file  . Number of children: Not on file  . Years of education: Not on file  . Highest education level: Not on file  Occupational History  . Not on file  Social Needs  . Financial resource strain: Not on file  . Food insecurity    Worry: Not on file    Inability: Not on file  . Transportation needs    Medical: Not on file    Non-medical: Not on file  Tobacco Use  . Smoking status: Never Smoker  . Smokeless tobacco: Never Used  Substance and Sexual Activity  . Alcohol use: Not Currently  . Drug use: Never  . Sexual activity: Not on file  Lifestyle  . Physical activity    Days per week: Not on file    Minutes per session: Not on file  . Stress: Not on file  Relationships  . Social Herbalist on phone: Not on file    Gets together: Not on file    Attends religious service: Not on file    Active member of club or organization: Not on file    Attends meetings of clubs or organizations: Not on file    Relationship status: Not on file  . Intimate partner violence    Fear of current or ex partner: Not on file    Emotionally abused: Not on file    Physically abused: Not on file    Forced sexual activity: Not on file  Other Topics Concern  . Not on file  Social History Narrative  . Not on file    Family History:   No family history on file.   ROS:  Please see the history of present illness.   All other ROS reviewed and negative.     Physical Exam/Data:   Vitals:   06/08/19 2247 06/08/19 2253 06/09/19 0324 06/09/19 0808  BP: (!) 115/58  134/82 116/76  Pulse: 74 (!) 102 92 96  Resp: 20 18  18 19   Temp: 98 F (36.7 C) 97.7 F (36.5 C) 98 F (36.7 C) 98.5 F (36.9 C)  TempSrc: Oral Oral Oral Oral  SpO2: 100% 94% 97% 97%  Weight:   59.9 kg   Height:        Intake/Output Summary (Last 24 hours) at 06/09/2019 1058 Last data filed at 06/09/2019  1011 Gross per 24 hour  Intake 240 ml  Output 4700 ml  Net -4460 ml   Last 3 Weights 06/09/2019 06/08/2019 06/08/2019  Weight (lbs) 132 lb 1.6 oz 136 lb 9.6 oz 139 lb 15.9 oz  Weight (kg) 59.92 kg 61.961 kg 63.5 kg     Body mass index is 21.98 kg/m.  General:  Elderly, Well nourished, well developed, in no acute distress HEENT: normal Lymph: no adenopathy Neck: no JVD Endocrine:  No thryomegaly Vascular: No carotid bruits; FA pulses 2+ bilaterally without bruits  Cardiac:  normal S1, S2; IRIRR; no murmur  Lungs:  clear to auscultation bilaterally, no wheezing, rhonchi or rales  Abd: soft, nontender, no hepatomegaly  Ext: no edema Musculoskeletal:  No deformities, BUE and BLE strength normal and equal Skin: warm and dry  Neuro:  CNs 2-12 intact, no focal abnormalities noted Psych:  Normal affect   EKG:  The EKG was personally reviewed and demonstrates:  Atrial fib with a controlled VR Telemetry:  Telemetry was personally reviewed and demonstrates:  Atrial fib with a controlled VR  Relevant CV Studies: none  Laboratory Data:  High Sensitivity Troponin:   Recent Labs  Lab 06/08/19 1120 06/08/19 1307  TROPONINIHS 9 9     Chemistry Recent Labs  Lab 06/08/19 1120 06/09/19 0605  NA 141 141  K 3.5 3.4*  CL 105 104  CO2 26 29  GLUCOSE 122* 96  BUN 20 18  CREATININE 0.74 0.65  CALCIUM 8.5* 8.5*  GFRNONAA >60 >60  GFRAA >60 >60  ANIONGAP 10 8    Recent Labs  Lab 06/08/19 1120  PROT 6.0*  ALBUMIN 3.3*  AST 19  ALT 17  ALKPHOS 68  BILITOT 0.8   Hematology Recent Labs  Lab 06/08/19 1120 06/09/19 0605  WBC 11.9* 8.9  RBC 3.16* 3.35*  HGB 8.1* 8.8*  HCT 27.6* 29.0*  MCV 87.3 86.6  MCH 25.6*  26.3  MCHC 29.3* 30.3  RDW 16.6* 16.6*  PLT 481* 458*   BNP Recent Labs  Lab 06/08/19 1120  BNP 799.0*    DDimer No results for input(s): DDIMER in the last 168 hours.   Radiology/Studies:  Ct Angio Chest Pe W Or Wo Contrast  Result Date: 06/08/2019 CLINICAL DATA:  Shortness of breath for several weeks EXAM: CT ANGIOGRAPHY CHEST WITH CONTRAST TECHNIQUE: Multidetector CT imaging of the chest was performed using the standard protocol during bolus administration of intravenous contrast. Multiplanar CT image reconstructions and MIPs were obtained to evaluate the vascular anatomy. CONTRAST:  69mL OMNIPAQUE IOHEXOL 350 MG/ML SOLN COMPARISON:  None. FINDINGS: Cardiovascular: Satisfactory opacification of the pulmonary arteries to the segmental level. No evidence of pulmonary embolism. Gross cardiomegaly. Extensive 3 vessel coronary artery calcifications. No pericardial effusion. Aortic atherosclerosis. Mediastinum/Nodes: No enlarged mediastinal, hilar, or axillary lymph nodes. Thyroid gland, trachea, and esophagus demonstrate no significant findings. Lungs/Pleura: Moderate right, small left pleural effusions with associated atelectasis or consolidation. Extensive irregular and ground-glass airspace opacity, particularly conspicuous in the left upper lobe. Upper Abdomen: No acute abnormality. Musculoskeletal: No chest wall abnormality. No acute or significant osseous findings. Review of the MIP images confirms the above findings. IMPRESSION: 1. Negative examination for pulmonary embolism. 2. Extensive irregular and ground-glass airspace opacity, particularly in the left upper lobe. Findings may represent infection or edema. Underlying mild fibrotic interstitial lung disease is not excluded. 3. Moderate right, small left pleural effusions with associated atelectasis or consolidation. 4. Cardiomegaly and coronary artery disease. 5. Aortic Atherosclerosis (ICD10-I70.0). Electronically  Signed   By: Eddie Candle M.D.   On: 06/08/2019 15:01   Dg Chest Portable 1 View  Result Date: 06/08/2019 CLINICAL DATA:  Shortness of breath, few weeks duration. Positive COVID test 1 month ago. EXAM: PORTABLE CHEST 1 VIEW COMPARISON:  Chest x-ray 11/15/2018 FINDINGS: Heart size remains enlarged. Right hemidiaphragm partially obscured by basilar opacity. Bilateral interstitial and alveolar opacities affecting aerated portions of lung. No signs of acute bone finding. Spinal degenerative changes. IMPRESSION: 1. Stable cardiomegaly. 2. Findings that could in the appropriate clinical setting represent heart failure or volume overload with basilar atelectasis. Multifocal pneumonia could potentially have a similar appearance. Clinical correlation suggested. Electronically Signed   By: Zetta Bills M.D.   On: 06/08/2019 11:21    Assessment and Plan:   1. Acute on chronic diastolic heart failure - her symptoms are improved and she has diuresed almost 8 lbs. Continue IV lasix. Follow renal function.  2. Atrial fib - her rates are reasonably well controlled. Continue AV nodal blocking drugs. Atrial fib usually improves with regard to rate control as the patient becomes more euvolemic. 3. CAD - she presented with chest pressure which has resolved. No evidence of ACS. I would not work up further.  4. Disp. - she can be dischareged in the next 24-48 hours as she is euvolemic.       For questions or updates, please contact [place] Please consult [email or web address] for contact info under     Signed, Cristopher Peru, MD  06/09/2019 10:58 AM

## 2019-06-09 NOTE — Progress Notes (Addendum)
PROGRESS NOTE    Judith Keller  [id number] DOB: 25-Apr-1925 DOA: 06/08/2019 PCP: Harlow Ohms, MD    Brief Narrative:  Judith Keller is a 85 y.o. female with medical history significant of past medical history of atrial fibrillation on Coumadin, anxiety, depression, CAD, polymyalgia rheumatica comes to the hospital for evaluation of dyspnea on exertion and atypical chest discomfort.  Patient states she was diagnosed of Covid last month, was doing well until now comes in with progressive dyspnea on exertion and some substernal chest discomfort.  BNP elevated.  EKG was atrial fibrillation her O2 saturation was greater than 90% on room air.  Chest pain had resolved in the EDIn the ER her basic labs were unremarkable except her BNP was elevated at 799.  2 sets of cardiac enzymes were negative.  EKG showed rate controlled atrial fibrillation.  Although she was saturating greater than 90% on room air she was clearly dyspneic on physical examination and also concerns for pulmonary infiltrate cardiomegaly.  Chest pain had resolved in the ER.    Consultants:   Cardiology  Procedures:  Echo 06/08/2019  1. Left ventricular ejection fraction, by visual estimation, is 40 to 45%. The left ventricle has moderately decreased function. There is mildly increased left ventricular hypertrophy.  2. Left ventricular diastolic parameters are consistent with Grade I diastolic dysfunction (impaired relaxation).  3. Mild to moderately dilated left ventricular internal cavity size.  4. Moderate, fixed thrombus on the lateral wall of the left ventricle.  5. Global right ventricle has mildly reduced systolic function.The right ventricular size is mildly enlarged. Mildly increased right ventricular wall thickness.  6. Left atrial size was mild-moderately dilated.  7. Right atrial size was moderately dilated.  8. Trivial pericardial effusion is present.  9. The mitral valve is myxomatous. Moderate to severe mitral  valve regurgitation. 10. The tricuspid valve is grossly normal. Tricuspid valve regurgitation mild-moderate. 11. The aortic valve was not well visualized. Aortic valve regurgitation is not visualized. 12. The pulmonic valve was normal in structure. Pulmonic valve regurgitation is mild. 13. The aortic root was not well visualized. 14. Moderately elevated pulmonary artery systolic pressure. 15. The interatrial septum was not assessed.   Antimicrobials:   None   Subjective: Patient is hard of hearing.  She reports her shortness of breath is a little bit better, not at bedside.  Denies any chest pain, nausea, abdominal pain, or any other symptoms  Objective: Vitals:   06/08/19 2247 06/08/19 2253 06/09/19 0324 06/09/19 0808  BP: (!) 115/58  134/82 116/76  Pulse: 74 (!) 102 92 96  Resp: 20 18 18 19   Temp: 98 F (36.7 C) 97.7 F (36.5 C) 98 F (36.7 C) 98.5 F (36.9 C)  TempSrc: Oral Oral Oral Oral  SpO2: 100% 94% 97% 97%  Weight:   59.9 kg   Height:        Intake/Output Summary (Last 24 hours) at 06/09/2019 1208 Last data filed at 06/09/2019 1011 Gross per 24 hour  Intake 240 ml  Output 4700 ml  Net -4460 ml   Filed Weights   06/08/19 1109 06/08/19 1814 06/09/19 0324  Weight: 63.5 kg 62 kg 59.9 kg    Examination:  General exam: Appears calm and comfortable  Respiratory system: fine rales, no wheezing or rhonchi Cardiovascular system: Irregular, S1 & S2 heard, no r/m/g, no jvd Gastrointestinal system: Abdomen is nondistended, soft and nontender. Normal bowel sounds heard. Central nervous system: Alert and oriented. No focal neurological deficits.  Extremities: No edema or cyanosis Skin: Warm and dry Psychiatry: Judgement and insight appear normal. Mood & affect appropriate.     Data Reviewed: I have personally reviewed following labs and imaging studies  CBC: Recent Labs  Lab 06/08/19 1120 06/09/19 0605  WBC 11.9* 8.9  NEUTROABS 10.9*  --   HGB 8.1* 8.8*   HCT 27.6* 29.0*  MCV 87.3 86.6  PLT 481* [id number]*   Basic Metabolic Panel: Recent Labs  Lab 06/08/19 1120 06/09/19 0605  NA 141 141  K 3.5 3.4*  CL 105 104  CO2 26 29  GLUCOSE 122* 96  BUN 20 18  CREATININE 0.74 0.65  CALCIUM 8.5* 8.5*  MG  --  2.1   GFR: Estimated Creatinine Clearance: 40.4 mL/min (by C-G formula based on SCr of 0.65 mg/dL). Liver Function Tests: Recent Labs  Lab 06/08/19 1120  AST 19  ALT 17  ALKPHOS 68  BILITOT 0.8  PROT 6.0*  ALBUMIN 3.3*   No results for input(s): LIPASE, AMYLASE in the last 168 hours. No results for input(s): AMMONIA in the last 168 hours. Coagulation Profile: Recent Labs  Lab 06/08/19 1549 06/09/19 0605  INR 2.1* 2.2*   Cardiac Enzymes: No results for input(s): CKTOTAL, CKMB, CKMBINDEX, TROPONINI in the last 168 hours. BNP (last 3 results) No results for input(s): PROBNP in the last 8760 hours. HbA1C: No results for input(s): HGBA1C in the last 72 hours. CBG: Recent Labs  Lab 06/09/19 0809  GLUCAP 87   Lipid Profile: No results for input(s): CHOL, HDL, LDLCALC, TRIG, CHOLHDL, LDLDIRECT in the last 72 hours. Thyroid Function Tests: Recent Labs    06/08/19 1120  TSH 4.813*   Anemia Panel: No results for input(s): VITAMINB12, FOLATE, FERRITIN, TIBC, IRON, RETICCTPCT in the last 72 hours. Sepsis Labs: Recent Labs  Lab 06/08/19 1120  PROCALCITON <0.10    Recent Results (from the past 240 hour(s))  MRSA PCR Screening     Status: None   Collection Time: 06/08/19  4:47 PM   Specimen: Nasal Mucosa; Nasopharyngeal  Result Value Ref Range Status   MRSA by PCR NEGATIVE NEGATIVE Final    Comment:        The GeneXpert MRSA Assay (FDA approved for NASAL specimens only), is one component of a comprehensive MRSA colonization surveillance program. It is not intended to diagnose MRSA infection nor to guide or monitor treatment for MRSA infections. Performed at [facility], [address]  [place], [place] [address]          Radiology Studies: Ct Angio Chest Pe W Or Wo Contrast  Result Date: 06/08/2019 CLINICAL DATA:  Shortness of breath for several weeks EXAM: CT ANGIOGRAPHY CHEST WITH CONTRAST TECHNIQUE: Multidetector CT imaging of the chest was performed using the standard protocol during bolus administration of intravenous contrast. Multiplanar CT image reconstructions and MIPs were obtained to evaluate the vascular anatomy. CONTRAST:  27mL OMNIPAQUE IOHEXOL 350 MG/ML SOLN COMPARISON:  None. FINDINGS: Cardiovascular: Satisfactory opacification of the pulmonary arteries to the segmental level. No evidence of pulmonary embolism. Gross cardiomegaly. Extensive 3 vessel coronary artery calcifications. No pericardial effusion. Aortic atherosclerosis. Mediastinum/Nodes: No enlarged mediastinal, hilar, or axillary lymph nodes. Thyroid gland, trachea, and esophagus demonstrate no significant findings. Lungs/Pleura: Moderate right, small left pleural effusions with associated atelectasis or consolidation. Extensive irregular and ground-glass airspace opacity, particularly conspicuous in the left upper lobe. Upper Abdomen: No acute abnormality. Musculoskeletal: No chest wall abnormality. No acute or significant osseous findings. Review of the MIP  images confirms the above findings. IMPRESSION: 1. Negative examination for pulmonary embolism. 2. Extensive irregular and ground-glass airspace opacity, particularly in the left upper lobe. Findings may represent infection or edema. Underlying mild fibrotic interstitial lung disease is not excluded. 3. Moderate right, small left pleural effusions with associated atelectasis or consolidation. 4. Cardiomegaly and coronary artery disease. 5. Aortic Atherosclerosis (ICD10-I70.0). Electronically Signed   By: Eddie Candle M.D.   On: 06/08/2019 15:01   Dg Chest Portable 1 View  Result Date: 06/08/2019 CLINICAL DATA:  Shortness of breath, few weeks duration.  Positive COVID test 1 month ago. EXAM: PORTABLE CHEST 1 VIEW COMPARISON:  Chest x-ray 11/15/2018 FINDINGS: Heart size remains enlarged. Right hemidiaphragm partially obscured by basilar opacity. Bilateral interstitial and alveolar opacities affecting aerated portions of lung. No signs of acute bone finding. Spinal degenerative changes. IMPRESSION: 1. Stable cardiomegaly. 2. Findings that could in the appropriate clinical setting represent heart failure or volume overload with basilar atelectasis. Multifocal pneumonia could potentially have a similar appearance. Clinical correlation suggested. Electronically Signed   By: Zetta Bills M.D.   On: 06/08/2019 11:21        Scheduled Meds: . albuterol  2 puff Inhalation Once  . aspirin EC  81 mg Oral Daily  . atorvastatin  10 mg Oral QHS  . citalopram  10 mg Oral Daily  . diltiazem  120 mg Oral Daily  . furosemide  40 mg Intravenous Q12H  . metoprolol succinate  50 mg Oral Daily  . polyethylene glycol  17 g Oral Daily  . predniSONE  5 mg Oral Q breakfast  . Warfarin - Pharmacist Dosing Inpatient   Does not apply q1800   Continuous Infusions:  Assessment & Plan:   Principal Problem:   Acute exacerbation of CHF (congestive heart failure) (HCC) Active Problems:   Acute respiratory distress   Hypoxia   Elevated brain natriuretic peptide (BNP) level   Atrial fibrillation, chronic (HCC)   Polymyalgia rheumatica (HCC)   CAD (coronary artery disease)   Pressure injury of skin  Acute combined systolic and diastolic heart failure Echo with EF40 to 45%.  And grade 1 diastolic dysfunction Echocardiogram 2016 showed ejection fraction greater than 55% Elevated bnp, appears mildly volume overloaded Cards was consulted, input was appreciated- no further cardiac workout as pt without evidence of ACS. We will continue IV Lasix Strict I's and O's Monitor renal function Daily weight Telemetry    Recent COVID-19 -Procalcitonin negative -will  check covid test today.   History of atrial fibrillation History of CAD -Continue diltiazem, metoprolol.  Pharmacy to dose Coumadin -Aspirin and statin -Inr 2.2   Chronic hypoxia on 3 L nasal cannula -Chronic fibrotic disease.  As needed bronchodilators.  Polymyalgia rheumatica -Prednisone 5 mg daily  Depression/anxiety -Daily Celexa   DVT prophylaxis: coumadin Code Status:DNR Family Communication: none at bedside Disposition Plan: Possible DC in 1 to 2 days once more clinically stable       LOS: 0 days   Time spent: 45 minutes with more than 50% COC    Nolberto Hanlon, MD [facility] Pager [phone]  If 7PM-7AM, please contact night-coverage [email or web address] Password [facility] 06/09/2019, 12:08 PM

## 2019-06-09 NOTE — Progress Notes (Signed)
PHARMACIST - PHYSICIAN ORDER COMMUNICATION  CONCERNING: Diltiazem and Simvastatin  and risk of rhabdomyolysis  DESCRIPTION:  Patients on diltiazem and simvastatin >10 mg/day have reported cases of rhabdomyolysis. Pharmacy is to assess simvastatin dose. If >10 mg, substitute atorvastatin (Lipitor) 1mg  for each 2mg  simvastatin.  This patient is ordered simvastatin 20mg  and diltiazem 120mg .    ACTION TAKEN: Per protocol pharmacy has discontinued the patient's order for simvastatin and replaced it with Atorvastatin 10mg .    Dallie Piles, PharmD Clinical Pharmacist 06/09/2019 10:33 AM

## 2019-06-10 DIAGNOSIS — I5041 Acute combined systolic (congestive) and diastolic (congestive) heart failure: Secondary | ICD-10-CM

## 2019-06-10 DIAGNOSIS — I5043 Acute on chronic combined systolic (congestive) and diastolic (congestive) heart failure: Secondary | ICD-10-CM

## 2019-06-10 LAB — BASIC METABOLIC PANEL
Anion gap: 10 (ref 5–15)
BUN: 24 mg/dL — ABNORMAL HIGH (ref 8–23)
CO2: 27 mmol/L (ref 22–32)
Calcium: 9 mg/dL (ref 8.9–10.3)
Chloride: 103 mmol/L (ref 98–111)
Creatinine, Ser: 0.65 mg/dL (ref 0.44–1.00)
GFR calc Af Amer: >60 mL/min (ref >60)
GFR calc non Af Amer: >60 mL/min (ref >60)
Glucose, Bld: 95 mg/dL (ref 70–99)
Potassium: 4.5 mmol/L (ref 3.5–5.1)
Sodium: 140 mmol/L (ref 135–145)

## 2019-06-10 LAB — CBC
HCT: 29.5 % — ABNORMAL LOW (ref 36.0–46.0)
Hemoglobin: 8.9 g/dL — ABNORMAL LOW (ref 12.0–15.0)
MCH: 25.8 pg — ABNORMAL LOW (ref 26.0–34.0)
MCHC: 30.2 g/dL (ref 30.0–36.0)
MCV: 85.5 fL (ref 80.0–100.0)
Platelets: 447 10*3/uL — ABNORMAL HIGH (ref 150–400)
RBC: 3.45 MIL/uL — ABNORMAL LOW (ref 3.87–5.11)
RDW: 16.5 % — ABNORMAL HIGH (ref 11.5–15.5)
WBC: 9.1 10*3/uL (ref 4.0–10.5)
nRBC: 0 % (ref 0.0–0.2)

## 2019-06-10 LAB — SARS CORONAVIRUS 2 (TAT 6-24 HRS): SARS Coronavirus 2: POSITIVE — AB

## 2019-06-10 LAB — MAGNESIUM: Magnesium: 2.1 mg/dL (ref 1.7–2.4)

## 2019-06-10 LAB — BRAIN NATRIURETIC PEPTIDE: B Natriuretic Peptide: 554 pg/mL — ABNORMAL HIGH (ref 0.0–100.0)

## 2019-06-10 LAB — PROTIME-INR
INR: 2.6 — ABNORMAL HIGH (ref 0.8–1.2)
Prothrombin Time: 27.6 seconds — ABNORMAL HIGH (ref 11.4–15.2)

## 2019-06-10 LAB — GLUCOSE, CAPILLARY: Glucose-Capillary: 85 mg/dL (ref 70–99)

## 2019-06-10 MED ORDER — FUROSEMIDE 40 MG PO TABS
40.0000 mg | ORAL_TABLET | Freq: Every day | ORAL | Status: DC
  Administered 2019-06-10: 40 mg via ORAL
  Filled 2019-06-10: qty 1

## 2019-06-10 NOTE — Progress Notes (Signed)
PROGRESS NOTE    Judith Keller  [id number] DOB: 11-10-1924 DOA: 06/08/2019 PCP: Harlow Ohms, MD    Brief Narrative:  Judith Keller is a 85 y.o. female with medical history significant of past medical history of atrial fibrillation on Coumadin, anxiety, depression, CAD, polymyalgia rheumatica comes to the hospital for evaluation of dyspnea on exertion and atypical chest discomfort.  Patient states she was diagnosed of Covid last month, was doing well until now comes in with progressive dyspnea on exertion and some substernal chest discomfort.  BNP elevated.  EKG was atrial fibrillation her O2 saturation was greater than 90% on room air.  Chest pain had resolved in the EDIn the ER her basic labs were unremarkable except her BNP was elevated at 799.  2 sets of cardiac enzymes were negative.  EKG showed rate controlled atrial fibrillation.  Although she was saturating greater than 90% on room air she was clearly dyspneic on physical examination and also concerns for pulmonary infiltrate cardiomegaly.  Chest pain had resolved in the ER.    Consultants:   Cardiology  Procedures:  Echo 06/08/2019  1. Left ventricular ejection fraction, by visual estimation, is 40 to 45%. The left ventricle has moderately decreased function. There is mildly increased left ventricular hypertrophy.  2. Left ventricular diastolic parameters are consistent with Grade I diastolic dysfunction (impaired relaxation).  3. Mild to moderately dilated left ventricular internal cavity size.  4. Moderate, fixed thrombus on the lateral wall of the left ventricle.  5. Global right ventricle has mildly reduced systolic function.The right ventricular size is mildly enlarged. Mildly increased right ventricular wall thickness.  6. Left atrial size was mild-moderately dilated.  7. Right atrial size was moderately dilated.  8. Trivial pericardial effusion is present.  9. The mitral valve is myxomatous. Moderate to severe mitral  valve regurgitation. 10. The tricuspid valve is grossly normal. Tricuspid valve regurgitation mild-moderate. 11. The aortic valve was not well visualized. Aortic valve regurgitation is not visualized. 12. The pulmonic valve was normal in structure. Pulmonic valve regurgitation is mild. 13. The aortic root was not well visualized. 14. Moderately elevated pulmonary artery systolic pressure. 15. The interatrial septum was not assessed.   Antimicrobials:   None   Subjective: Pt is feeling better. HR at times not well controlled. Denies sob, cp, dizziness.  Objective: Vitals:   06/09/19 1744 06/09/19 1934 06/10/19 0405 06/10/19 0804  BP: 140/72 106/69 (!) 143/77 104/77  Pulse: 80 (!) 102 78 92  Resp: 19 16 14 18   Temp:  98.5 F (36.9 C) (!) 97.4 F (36.3 C) 98.9 F (37.2 C)  TempSrc:  Oral Oral Oral  SpO2: 97% 98% 98% 90%  Weight:   59.3 kg   Height:        Intake/Output Summary (Last 24 hours) at 06/10/2019 1457 Last data filed at 06/10/2019 1300 Gross per 24 hour  Intake 600 ml  Output 2400 ml  Net -1800 ml   Filed Weights   06/08/19 1814 06/09/19 0324 06/10/19 0405  Weight: 62 kg 59.9 kg 59.3 kg    Examination:  General exam: Appears calm and comfortable  Respiratory system:Cta, no r/r/w Cardiovascular system: Irregular, S1 & S2 heard, no r/m/g, no jvd Gastrointestinal system:soft abd, nondistended,  nontender. Normal bowel sounds heard. Central nervous system: Alert and oriented. No focal neurological deficits. Extremities: No edema or cyanosis Skin: Warm and dry Psychiatry: Judgement and insight appear normal. Mood & affect appropriate.     Data Reviewed: I  have personally reviewed following labs and imaging studies  CBC: Recent Labs  Lab 06/08/19 1120 06/09/19 0605 06/10/19 0551  WBC 11.9* 8.9 9.1  NEUTROABS 10.9*  --   --   HGB 8.1* 8.8* 8.9*  HCT 27.6* 29.0* 29.5*  MCV 87.3 86.6 85.5  PLT 481* 458* [id number]*   Basic Metabolic Panel: Recent Labs   Lab 06/08/19 1120 06/09/19 0605 06/10/19 0551  NA 141 141 140  K 3.5 3.4* 4.5  CL 105 104 103  CO2 26 29 27   GLUCOSE 122* 96 95  BUN 20 18 24*  CREATININE 0.74 0.65 0.65  CALCIUM 8.5* 8.5* 9.0  MG  --  2.1 2.1   GFR: Estimated Creatinine Clearance: 40.4 mL/min (by C-G formula based on SCr of 0.65 mg/dL). Liver Function Tests: Recent Labs  Lab 06/08/19 1120  AST 19  ALT 17  ALKPHOS 68  BILITOT 0.8  PROT 6.0*  ALBUMIN 3.3*   No results for input(s): LIPASE, AMYLASE in the last 168 hours. No results for input(s): AMMONIA in the last 168 hours. Coagulation Profile: Recent Labs  Lab 06/08/19 1549 06/09/19 0605 06/10/19 0551  INR 2.1* 2.2* 2.6*   Cardiac Enzymes: No results for input(s): CKTOTAL, CKMB, CKMBINDEX, TROPONINI in the last 168 hours. BNP (last 3 results) No results for input(s): PROBNP in the last 8760 hours. HbA1C: No results for input(s): HGBA1C in the last 72 hours. CBG: Recent Labs  Lab 06/09/19 0809 06/10/19 0807  GLUCAP 87 85   Lipid Profile: No results for input(s): CHOL, HDL, LDLCALC, TRIG, CHOLHDL, LDLDIRECT in the last 72 hours. Thyroid Function Tests: Recent Labs    06/08/19 1120  TSH 4.813*   Anemia Panel: No results for input(s): VITAMINB12, FOLATE, FERRITIN, TIBC, IRON, RETICCTPCT in the last 72 hours. Sepsis Labs: Recent Labs  Lab 06/08/19 1120  PROCALCITON <0.10    Recent Results (from the past 240 hour(s))  MRSA PCR Screening     Status: None   Collection Time: 06/08/19  4:47 PM   Specimen: Nasal Mucosa; Nasopharyngeal  Result Value Ref Range Status   MRSA by PCR NEGATIVE NEGATIVE Final    Comment:        The GeneXpert MRSA Assay (FDA approved for NASAL specimens only), is one component of a comprehensive MRSA colonization surveillance program. It is not intended to diagnose MRSA infection nor to guide or monitor treatment for MRSA infections. Performed at [facility], [place], [place] [address]   SARS CORONAVIRUS 2 (TAT 6-24 HRS) Nasopharyngeal Nasopharyngeal Swab     Status: Abnormal   Collection Time: 06/09/19  3:15 PM   Specimen: Nasopharyngeal Swab  Result Value Ref Range Status   SARS Coronavirus 2 POSITIVE (A) NEGATIVE Final    Comment: RESULT CALLED TO, READ BACK BY AND VERIFIED WITH: STEPHANIE SUMMERS RN.@[id number]  ON 11.29.2020 BY TCALDWELL MT. (NOTE) SARS-CoV-2 target nucleic acids are DETECTED. The SARS-CoV-2 RNA is generally detectable in upper and lower respiratory specimens during the acute phase of infection. Positive results are indicative of the presence of SARS-CoV-2 RNA. Clinical correlation with patient history and other diagnostic information is  necessary to determine patient infection status. Positive results do not rule out bacterial infection or co-infection with other viruses.  The expected result is Negative. Fact Sheet for Patients: SugarRoll.be Fact Sheet for Healthcare Providers: https://www.woods-mathews.com/ This test is not yet approved or cleared by the [place] FDA and  has been authorized for detection and/or diagnosis of SARS-CoV-2 by  FDA under an Emergency Use Authorization (EUA). This EUA will remain  in effect (meaning this tes t can be used) for the duration of the COVID-19 declaration under Section 564(b)(1) of the Act, 21 U.S.C. section 360bbb-3(b)(1), unless the authorization is terminated or revoked sooner. Performed at [facility], [place] [address] [place], [place] [address]          Radiology Studies: No results found.      Scheduled Meds: . albuterol  2 puff Inhalation Once  . aspirin EC  81 mg Oral Daily  . atorvastatin  10 mg Oral QHS  . citalopram  10 mg Oral Daily  . diltiazem  120 mg Oral Daily  . furosemide  40 mg Oral Daily  . metoprolol succinate  50 mg Oral Daily  . polyethylene glycol  17 g Oral Daily  . predniSONE  5 mg Oral Q  breakfast  . warfarin  5 mg Oral q1800  . Warfarin - Pharmacist Dosing Inpatient   Does not apply q1800   Continuous Infusions:  Assessment & Plan:   Principal Problem:   Acute exacerbation of CHF (congestive heart failure) (HCC) Active Problems:   Acute respiratory distress   Hypoxia   Elevated brain natriuretic peptide (BNP) level   Atrial fibrillation, chronic (HCC)   Polymyalgia rheumatica (HCC)   CAD (coronary artery disease)   Pressure injury of skin   CHF (congestive heart failure) (HCC)  Acute combined systolic and diastolic heart failure Echo with EF40 to 45%.  And grade 1 diastolic dysfunction Echocardiogram 2016 showed ejection fraction greater than 55% More euvolemic today. Good Diuresis. Cardiology recommended increasing her Cardizem to 180/day A. fib rate trolled and can be discharged from cardiac standpoint..  no further cardiac workout as pt without evidence of ACS. Will change Lasix to 40 daily Strict I's and O's Monitor renal function Daily weight Telemetry    Recent COVID-19 -Procalcitonin negative -Repeat Covid was positive today   History of atrial fibrillation History of CAD -Continue metoprolol.  -Increase Cardizem to 180 daily  Pharmacy to dose Coumadin -Aspirin and statin -Inr therapeutic Need to follow-up with Dr. Rockey Situ on discharge  Chronic hypoxia on 3 L nasal cannula -Chronic fibrotic disease.  As needed bronchodilators.  Polymyalgia rheumatica -Prednisone 5 mg daily  Depression/anxiety -Daily Celexa   DVT prophylaxis: coumadin Code Status:DNR Family Communication: none at bedside Disposition Plan: Since Covid positive in-house per case management we are unsure we can discharge patient today.  Possible DC tomorrow.     LOS: 1 day   Time spent: 45 minutes with more than 50% COC    Nolberto Hanlon, MD [facility] Pager [phone]  If 7PM-7AM, please contact night-coverage [email or web address] Password  [facility] 06/10/2019, 2:57 PM Patient ID: Judith Keller, female   DOB: 02-22-1925, 85 y.o.   MRN: [id number]

## 2019-06-10 NOTE — Progress Notes (Signed)
Progress Note  Patient Name: Judith Keller Date of Encounter: 06/10/2019  Primary Cardiologist: No primary care provider on file.   Subjective   Denies chest pain or sob.  Inpatient Medications    Scheduled Meds: . albuterol  2 puff Inhalation Once  . aspirin EC  81 mg Oral Daily  . atorvastatin  10 mg Oral QHS  . citalopram  10 mg Oral Daily  . diltiazem  120 mg Oral Daily  . furosemide  40 mg Intravenous Q12H  . metoprolol succinate  50 mg Oral Daily  . polyethylene glycol  17 g Oral Daily  . predniSONE  5 mg Oral Q breakfast  . warfarin  5 mg Oral q1800  . Warfarin - Pharmacist Dosing Inpatient   Does not apply q1800   Continuous Infusions:  PRN Meds: acetaminophen **OR** acetaminophen, hydrALAZINE, ondansetron **OR** ondansetron (ZOFRAN) IV, polyethylene glycol, senna-docusate, traMADol   Vital Signs    Vitals:   06/09/19 1744 06/09/19 1934 06/10/19 0405 06/10/19 0804  BP: 140/72 106/69 (!) 143/77 104/77  Pulse: 80 (!) 102 78 92  Resp: 19 16 14 18   Temp:  98.5 F (36.9 C) (!) 97.4 F (36.3 C) 98.9 F (37.2 C)  TempSrc:  Oral Oral Oral  SpO2: 97% 98% 98% 90%  Weight:   59.3 kg   Height:        Intake/Output Summary (Last 24 hours) at 06/10/2019 [id number] Last data filed at 06/10/2019 [id number] Gross per 24 hour  Intake 240 ml  Output 3100 ml  Net -2860 ml   Last 3 Weights 06/10/2019 06/09/2019 06/08/2019  Weight (lbs) 130 lb 12.8 oz 132 lb 1.6 oz 136 lb 9.6 oz  Weight (kg) 59.33 kg 59.92 kg 61.961 kg      Telemetry    Atrial fib with a RVR/CVR - Personally Reviewed  ECG    none - Personally Reviewed  Physical Exam   GEN: Well appearing elderly woman, no acute distress.   Neck: No JVD Cardiac: IRIRR, no murmurs, rubs, or gallops.  Respiratory: Clear to auscultation bilaterally. GI: Soft, nontender, non-distended  MS: No edema; No deformity. Neuro:  Nonfocal  Psych: Normal affect   Labs    High Sensitivity Troponin:   Recent Labs  Lab  06/08/19 1120 06/08/19 1307  TROPONINIHS 9 9      Chemistry Recent Labs  Lab 06/08/19 1120 06/09/19 0605 06/10/19 0551  NA 141 141 140  K 3.5 3.4* 4.5  CL 105 104 103  CO2 26 29 27   GLUCOSE 122* 96 95  BUN 20 18 24*  CREATININE 0.74 0.65 0.65  CALCIUM 8.5* 8.5* 9.0  PROT 6.0*  --   --   ALBUMIN 3.3*  --   --   AST 19  --   --   ALT 17  --   --   ALKPHOS 68  --   --   BILITOT 0.8  --   --   GFRNONAA >60 >60 >60  GFRAA >60 >60 >60  ANIONGAP 10 8 10      Hematology Recent Labs  Lab 06/08/19 1120 06/09/19 0605 06/10/19 0551  WBC 11.9* 8.9 9.1  RBC 3.16* 3.35* 3.45*  HGB 8.1* 8.8* 8.9*  HCT 27.6* 29.0* 29.5*  MCV 87.3 86.6 85.5  MCH 25.6* 26.3 25.8*  MCHC 29.3* 30.3 30.2  RDW 16.6* 16.6* 16.5*  PLT 481* 458* 447*    BNP Recent Labs  Lab 06/08/19 1120  BNP 799.0*  DDimer No results for input(s): DDIMER in the last 168 hours.   Radiology    Ct Angio Chest Pe W Or Wo Contrast  Result Date: 06/08/2019 CLINICAL DATA:  Shortness of breath for several weeks EXAM: CT ANGIOGRAPHY CHEST WITH CONTRAST TECHNIQUE: Multidetector CT imaging of the chest was performed using the standard protocol during bolus administration of intravenous contrast. Multiplanar CT image reconstructions and MIPs were obtained to evaluate the vascular anatomy. CONTRAST:  7mL OMNIPAQUE IOHEXOL 350 MG/ML SOLN COMPARISON:  None. FINDINGS: Cardiovascular: Satisfactory opacification of the pulmonary arteries to the segmental level. No evidence of pulmonary embolism. Gross cardiomegaly. Extensive 3 vessel coronary artery calcifications. No pericardial effusion. Aortic atherosclerosis. Mediastinum/Nodes: No enlarged mediastinal, hilar, or axillary lymph nodes. Thyroid gland, trachea, and esophagus demonstrate no significant findings. Lungs/Pleura: Moderate right, small left pleural effusions with associated atelectasis or consolidation. Extensive irregular and ground-glass airspace opacity,  particularly conspicuous in the left upper lobe. Upper Abdomen: No acute abnormality. Musculoskeletal: No chest wall abnormality. No acute or significant osseous findings. Review of the MIP images confirms the above findings. IMPRESSION: 1. Negative examination for pulmonary embolism. 2. Extensive irregular and ground-glass airspace opacity, particularly in the left upper lobe. Findings may represent infection or edema. Underlying mild fibrotic interstitial lung disease is not excluded. 3. Moderate right, small left pleural effusions with associated atelectasis or consolidation. 4. Cardiomegaly and coronary artery disease. 5. Aortic Atherosclerosis (ICD10-I70.0). Electronically Signed   By: Eddie Candle M.D.   On: 06/08/2019 15:01   Dg Chest Portable 1 View  Result Date: 06/08/2019 CLINICAL DATA:  Shortness of breath, few weeks duration. Positive COVID test 1 month ago. EXAM: PORTABLE CHEST 1 VIEW COMPARISON:  Chest x-ray 11/15/2018 FINDINGS: Heart size remains enlarged. Right hemidiaphragm partially obscured by basilar opacity. Bilateral interstitial and alveolar opacities affecting aerated portions of lung. No signs of acute bone finding. Spinal degenerative changes. IMPRESSION: 1. Stable cardiomegaly. 2. Findings that could in the appropriate clinical setting represent heart failure or volume overload with basilar atelectasis. Multifocal pneumonia could potentially have a similar appearance. Clinical correlation suggested. Electronically Signed   By: Zetta Bills M.D.   On: 06/08/2019 11:21    Cardiac Studies   Echo 06/08/19 1. Left ventricular ejection fraction, by visual estimation, is 40 to 45%. The left ventricle has moderately decreased function. There is mildly increased left ventricular hypertrophy. 2. Left ventricular diastolic parameters are consistent with Grade I diastolic dysfunction (impaired relaxation). 3. Mild to moderately dilated left ventricular internal cavity size. 4.  Moderate, fixed thrombus on the lateral wall of the left ventricle. 5. Global right ventricle has mildly reduced systolic function.The right ventricular size is mildly enlarged. Mildly increased right ventricular wall thickness. 6. Left atrial size was mild-moderately dilated. 7. Right atrial size was moderately dilated. 8. Trivial pericardial effusion is present. 9. The mitral valve is myxomatous. Moderate to severe mitral valve regurgitation. 10. The tricuspid valve is grossly normal. Tricuspid valve regurgitation mild-moderate. 11. The aortic valve was not well visualized. Aortic valve regurgitation is not visualized. 12. The pulmonic valve was normal in structure. Pulmonic valve regurgitation is mild. 13. The aortic root was not well visualized. 14. Moderately elevated pulmonary artery systolic pressure  Patient Profile     85 y.o. female with hx of atrial fibrillation  Asked to see by Dr Reesa Chew for CP and SOB    Assessment & Plan    1  Acute on chronic systolic/diastlic CHF - she has diuresed about 10 lbs and appears  euvolemic at this point. [place] for DC.   2  Atrial fibrillation   On rate control but still a little high. I would increase cardizem to 180/d and continue toprol at 50 mg daily.  3  Hx CP - no additional in patient workup indicated.   4. Disp. - ok for DC home from my perspective. She will need a followup with Dr. Harle Battiest.   For questions or updates, please contact [place] Please consult [email or web address] for contact info under     Signed, Cristopher Peru, MD  06/10/2019, 8:21 AM

## 2019-06-10 NOTE — Progress Notes (Signed)
Received phone call from [facility] with positive COVID results for patient.  Per chart, patient had been positive earlier last month.  This RN called ID and was told that patient did not have to be placed back on precautions due to her being recently positive. Dr. Kurtis Bushman made aware.

## 2019-06-10 NOTE — Consult Note (Signed)
ANTICOAGULATION CONSULT NOTE   Pharmacy Consult for Warfarin Dosing Indication: atrial fibrillation  No Known Allergies  Patient Measurements: Height: 5\' 5"  (165.1 cm) Weight: 130 lb 12.8 oz (59.3 kg) IBW/kg (Calculated) : 57 Heparin Dosing Weight: N/A  Vital Signs: Temp: 98.9 F (37.2 C) (11/29 0804) Temp Source: Oral (11/29 0804) BP: 104/77 (11/29 0804) Pulse Rate: 92 (11/29 0804)  Labs: Recent Labs    06/08/19 1120 06/08/19 1307 06/08/19 1549 06/09/19 0605 06/10/19 0551  HGB 8.1*  --   --  8.8* 8.9*  HCT 27.6*  --   --  29.0* 29.5*  PLT 481*  --   --  458* 447*  APTT  --   --  39*  --   --   LABPROT  --   --  23.4* 24.4* 27.6*  INR  --   --  2.1* 2.2* 2.6*  CREATININE 0.74  --   --  0.65 0.65  TROPONINIHS 9 9  --   --   --     Estimated Creatinine Clearance: 40.4 mL/min (by C-G formula based on SCr of 0.65 mg/dL).   Medical History: Past Medical History:  Diagnosis Date  . Atrial fibrillation ([place])   . Heart failure ([place])   . Hyperlipidemia   . Hypertension   . Hypokalemia   . Osteoarthritis     Medications:  Medications Prior to Admission  Medication Sig Dispense Refill Last Dose  . acetaminophen (TYLENOL) 325 MG tablet Take 650 mg by mouth every 6 (six) hours as needed for mild pain.   prn at prn  . albuterol (VENTOLIN HFA) 108 (90 Base) MCG/ACT inhaler Inhale 1 puff into the lungs every 4 (four) hours as needed for wheezing or shortness of breath.   prn at prn  . aspirin EC 81 MG tablet Take 1 tablet (81 mg total) by mouth daily. 150 tablet 2 06/08/2019 at 0900  . cholecalciferol (VITAMIN D) 25 MCG (1000 UT) tablet Take 2,000 Units by mouth daily.   06/08/2019 at 0900  . citalopram (CELEXA) 10 MG tablet Take 10 mg by mouth daily.   06/08/2019 at 0900  . diltiazem (DILACOR XR) 120 MG 24 hr capsule Take 120 mg by mouth daily.   06/08/2019 at 0900  . fluticasone (FLONASE) 50 MCG/ACT nasal spray Place 2 sprays into both nostrils at bedtime.   prn at prn   . furosemide (LASIX) 20 MG tablet Take 40 mg by mouth daily.   06/08/2019 at 0800  . Melatonin 3 MG TABS Take 3 mg by mouth at bedtime.   06/07/2019 at 2000  . metoprolol succinate (TOPROL-XL) 25 MG 24 hr tablet Take 50 mg by mouth daily. Take with or immediately following a meal.    06/08/2019 at 0800  . Multiple Vitamin (MULTIVITAMIN WITH MINERALS) TABS tablet Take 1 tablet by mouth daily.   06/08/2019 at 0900  . polyethylene glycol (MIRALAX / GLYCOLAX) 17 g packet Take 17 g by mouth daily.   prn at prn  . predniSONE (DELTASONE) 5 MG tablet Take 5 mg by mouth daily with breakfast.   06/08/2019 at 0900  . simvastatin (ZOCOR) 20 MG tablet Take 20 mg by mouth at bedtime.    06/07/2019 at 2000  . traMADol (ULTRAM) 50 MG tablet Take 1 tablet (50 mg total) by mouth every 6 (six) hours as needed for moderate pain. 20 tablet 0 prn at prn  . warfarin (COUMADIN) 5 MG tablet Take 1 tablet (5 mg total) by mouth  one time only at 6 PM for 4 days. (Patient taking differently: Take 5 mg by mouth daily. ) 4 tablet 0 06/07/2019 at 1900   Scheduled:  . albuterol  2 puff Inhalation Once  . aspirin EC  81 mg Oral Daily  . atorvastatin  10 mg Oral QHS  . citalopram  10 mg Oral Daily  . diltiazem  120 mg Oral Daily  . furosemide  40 mg Oral Daily  . metoprolol succinate  50 mg Oral Daily  . polyethylene glycol  17 g Oral Daily  . predniSONE  5 mg Oral Q breakfast  . warfarin  5 mg Oral q1800  . Warfarin - Pharmacist Dosing Inpatient   Does not apply q1800   Infusions:   PRN: acetaminophen **OR** acetaminophen, hydrALAZINE, ondansetron **OR** ondansetron (ZOFRAN) IV, polyethylene glycol, senna-docusate, traMADol Anti-infectives (From admission, onward)   None      Assessment: Pharmacy has been consulted to dose Warfarin for 85yo patient with shortness of breath. Patient was diagnosed with COVID the beginning of last month. Denies any chest pain currently. Noted to have small drop in hemoglobin from  baseline but may be dilutional. Patient currently takes Warfarin 5mg  daily. Last dose taken was 11/26@1900 . Baseline labs have been ordered.  11/27: INR 2.1   5 mg 11/28: INR 2.2   5 mg 11/29  INR 2.6  Goal of Therapy:  INR 2-3 Monitor platelets by anticoagulation protocol: Yes   Plan:  Patient is currently therapeutic. Will continue with home dose of Warfarin 5mg .  CHF patient. Prednisone ordered  Will recheck INR and continue to monitor H&H with AM labs.  Judith Keller A 06/10/2019,12:16 PM

## 2019-06-11 DIAGNOSIS — R0603 Acute respiratory distress: Secondary | ICD-10-CM

## 2019-06-11 LAB — BASIC METABOLIC PANEL
Anion gap: 12 (ref 5–15)
BUN: 29 mg/dL — ABNORMAL HIGH (ref 8–23)
CO2: 26 mmol/L (ref 22–32)
Calcium: 8.6 mg/dL — ABNORMAL LOW (ref 8.9–10.3)
Chloride: 102 mmol/L (ref 98–111)
Creatinine, Ser: 0.69 mg/dL (ref 0.44–1.00)
GFR calc Af Amer: >60 mL/min (ref >60)
GFR calc non Af Amer: >60 mL/min (ref >60)
Glucose, Bld: 92 mg/dL (ref 70–99)
Potassium: 3.5 mmol/L (ref 3.5–5.1)
Sodium: 140 mmol/L (ref 135–145)

## 2019-06-11 LAB — CBC
HCT: 29.9 % — ABNORMAL LOW (ref 36.0–46.0)
Hemoglobin: 9.1 g/dL — ABNORMAL LOW (ref 12.0–15.0)
MCH: 26 pg (ref 26.0–34.0)
MCHC: 30.4 g/dL (ref 30.0–36.0)
MCV: 85.4 fL (ref 80.0–100.0)
Platelets: 453 10*3/uL — ABNORMAL HIGH (ref 150–400)
RBC: 3.5 MIL/uL — ABNORMAL LOW (ref 3.87–5.11)
RDW: 16.5 % — ABNORMAL HIGH (ref 11.5–15.5)
WBC: 10.2 10*3/uL (ref 4.0–10.5)
nRBC: 0 % (ref 0.0–0.2)

## 2019-06-11 LAB — GLUCOSE, CAPILLARY: Glucose-Capillary: 116 mg/dL — ABNORMAL HIGH (ref 70–99)

## 2019-06-11 LAB — MAGNESIUM: Magnesium: 2.3 mg/dL (ref 1.7–2.4)

## 2019-06-11 LAB — PROTIME-INR
INR: 2.4 — ABNORMAL HIGH (ref 0.8–1.2)
Prothrombin Time: 26.3 seconds — ABNORMAL HIGH (ref 11.4–15.2)

## 2019-06-11 MED ORDER — FUROSEMIDE 20 MG PO TABS
20.0000 mg | ORAL_TABLET | Freq: Every day | ORAL | Status: DC
  Administered 2019-06-11: 20 mg via ORAL
  Filled 2019-06-11: qty 1

## 2019-06-11 MED ORDER — FUROSEMIDE 20 MG PO TABS: 20.0000 mg | ORAL_TABLET | Freq: Every day | ORAL | 0 refills | Status: AC

## 2019-06-11 NOTE — Discharge Summary (Signed)
Judith Keller [id number] DOB: 1925-03-03 DOA: 06/08/2019  PCP: Harlow Ohms, MD  Admit date: 06/08/2019 Discharge date: 06/11/2019  Admitted From: [facility] Disposition: [facility]  Recommendations for Outpatient Follow-up:  1. Follow up with PCP in 1 week 2. Please obtain BMP/CBC in one week 3. Please follow up on the following pending results:none 4. Check inr in am  Home Health:none    Discharge Condition:Stable CODE STATUS:DNR  Diet recommendation: Heart Healthy , low sodium diet. Brief/Interim Summary: Judith Keller a 85 y.o.femalewith medical history significant ofpast medical history of atrial fibrillation on Coumadin, anxiety, depression, CAD, polymyalgia rheumatica comes to the hospital for evaluation of dyspnea on exertion and atypical chest discomfort. Patient states she was diagnosed of Covid last month, was doing well until now comes in with progressive dyspnea on exertion and some substernal chest discomfort.  BNP elevated.  EKG was atrial fibrillation her O2 saturation was greater than 90% on room air.  Chest pain had resolved in the ED. In the ER her basic labs were unremarkable except her BNP was elevated at 799. 2 sets of cardiac enzymes were negative. EKG showed rate controlled atrial fibrillation.Cardiology was consulted.  Patient was diuresed with IV Lasix.  Cardiology recommended increasing Cardizem to 180 mg daily however due to her blood pressure we were unable to do so.  Currently she is doing better her rate is controlled.  She did have an echocardiogram that revealed EF of 40 to 45% and grade 1 diastolic dysfunction.  A repeat Covid test was positive prior to her going back to rehab however she did have Covid last month.  She is to follow-up with Dr. Rockey Situ cardiology in 1 week.  She will also need her INR to be checked tomorrow with goal INR 2-3.  She is stable to be discharged   Discharge Diagnoses:  Principal Problem:   Acute exacerbation  of CHF (congestive heart failure) (HCC) Active Problems:   Acute respiratory distress   Hypoxia   Elevated brain natriuretic peptide (BNP) level   Atrial fibrillation, chronic (HCC)   Polymyalgia rheumatica (HCC)   CAD (coronary artery disease)   Pressure injury of skin   CHF (congestive heart failure) [employer])    Discharge Instructions  Discharge Instructions    Call MD for:  difficulty breathing, headache or visual disturbances   Complete by: As directed    Call MD for:  temperature >100.4   Complete by: As directed    Diet - low sodium heart healthy   Complete by: As directed    Discharge instructions   Complete by: As directed    Follow up with cardiology Dr. Karl Bales in one week. Strict intake/output  Low sodium diet 1500mg    Increase activity slowly   Complete by: As directed      Allergies as of 06/11/2019   No Known Allergies     Medication List    TAKE these medications   acetaminophen 325 MG tablet Commonly known as: TYLENOL Take 650 mg by mouth every 6 (six) hours as needed for mild pain.   albuterol 108 (90 Base) MCG/ACT inhaler Commonly known as: VENTOLIN HFA Inhale 1 puff into the lungs every 4 (four) hours as needed for wheezing or shortness of breath.   aspirin EC 81 MG tablet Take 1 tablet (81 mg total) by mouth daily.   cholecalciferol 25 MCG (1000 UT) tablet Commonly known as: VITAMIN D Take 2,000 Units by mouth daily.   citalopram 10 MG tablet  Commonly known as: CELEXA Take 10 mg by mouth daily.   diltiazem 120 MG 24 hr capsule Commonly known as: DILACOR XR Take 120 mg by mouth daily.   fluticasone 50 MCG/ACT nasal spray Commonly known as: FLONASE Place 2 sprays into both nostrils at bedtime.   furosemide 20 MG tablet Commonly known as: LASIX Take 1 tablet (20 mg total) by mouth daily. Start taking on: June 12, 2019 What changed: how much to take   Melatonin 3 MG Tabs Take 3 mg by mouth at bedtime.   metoprolol succinate 25  MG 24 hr tablet Commonly known as: TOPROL-XL Take 50 mg by mouth daily. Take with or immediately following a meal.   multivitamin with minerals Tabs tablet Take 1 tablet by mouth daily.   polyethylene glycol 17 g packet Commonly known as: MIRALAX / GLYCOLAX Take 17 g by mouth daily.   predniSONE 5 MG tablet Commonly known as: DELTASONE Take 5 mg by mouth daily with breakfast.   simvastatin 20 MG tablet Commonly known as: ZOCOR Take 20 mg by mouth at bedtime.   traMADol 50 MG tablet Commonly known as: ULTRAM Take 1 tablet (50 mg total) by mouth every 6 (six) hours as needed for moderate pain.   warfarin 5 MG tablet Commonly known as: COUMADIN Take 1 tablet (5 mg total) by mouth one time only at 6 PM for 4 days. What changed: when to take this      Follow-up Information    [facility] Follow up on 07/02/2019.   Specialty: Cardiology Why: at 12:30pm. Enter through the [facility] entrance Contact information: [place]         No Known Allergies  Consultations: cardiology  Procedures/Studies: Ct Angio Chest Pe W Or Wo Contrast  Result Date: 06/08/2019 CLINICAL DATA:  Shortness of breath for several weeks EXAM: CT ANGIOGRAPHY CHEST WITH CONTRAST TECHNIQUE: Multidetector CT imaging of the chest was performed using the standard protocol during bolus administration of intravenous contrast. Multiplanar CT image reconstructions and MIPs were obtained to evaluate the vascular anatomy. CONTRAST:  78mL OMNIPAQUE IOHEXOL 350 MG/ML SOLN COMPARISON:  None. FINDINGS: Cardiovascular: Satisfactory opacification of the pulmonary arteries to the segmental level. No evidence of pulmonary embolism. Gross cardiomegaly. Extensive 3 vessel coronary artery calcifications. No pericardial effusion. Aortic atherosclerosis. Mediastinum/Nodes: No enlarged mediastinal, hilar, or axillary  lymph nodes. Thyroid gland, trachea, and esophagus demonstrate no significant findings. Lungs/Pleura: Moderate right, small left pleural effusions with associated atelectasis or consolidation. Extensive irregular and ground-glass airspace opacity, particularly conspicuous in the left upper lobe. Upper Abdomen: No acute abnormality. Musculoskeletal: No chest wall abnormality. No acute or significant osseous findings. Review of the MIP images confirms the above findings. IMPRESSION: 1. Negative examination for pulmonary embolism. 2. Extensive irregular and ground-glass airspace opacity, particularly in the left upper lobe. Findings may represent infection or edema. Underlying mild fibrotic interstitial lung disease is not excluded. 3. Moderate right, small left pleural effusions with associated atelectasis or consolidation. 4. Cardiomegaly and coronary artery disease. 5. Aortic Atherosclerosis (ICD10-I70.0). Electronically Signed   By: Eddie Candle M.D.   On: 06/08/2019 15:01   Dg Chest Portable 1 View  Result Date: 06/08/2019 CLINICAL DATA:  Shortness of breath, few weeks duration. Positive COVID test 1 month ago. EXAM: PORTABLE CHEST 1 VIEW COMPARISON:  Chest x-ray 11/15/2018 FINDINGS: Heart size remains enlarged. Right hemidiaphragm partially obscured by basilar opacity. Bilateral interstitial and  alveolar opacities affecting aerated portions of lung. No signs of acute bone finding. Spinal degenerative changes. IMPRESSION: 1. Stable cardiomegaly. 2. Findings that could in the appropriate clinical setting represent heart failure or volume overload with basilar atelectasis. Multifocal pneumonia could potentially have a similar appearance. Clinical correlation suggested. Electronically Signed   By: Zetta Bills M.D.   On: 06/08/2019 11:21       Subjective: Feels better.  Reports at baseline.  No complaints today.  Discharge Exam: Vitals:   06/11/19 0816 06/11/19 0900  BP: 101/62 101/65  Pulse: 83  95  Resp: 20   Temp: 97.6 F (36.4 C)   SpO2:  (!) 89%   Vitals:   06/11/19 0344 06/11/19 0516 06/11/19 0816 06/11/19 0900  BP: 115/60  101/62 101/65  Pulse: 61  83 95  Resp: 17  20   Temp: 97.6 F (36.4 C)  97.6 F (36.4 C)   TempSrc: Oral  Oral   SpO2: 100%   (!) 89%  Weight:  58.5 kg    Height:        General: Pt is alert, awake, not in acute distress Cardiovascular: RRR, S1/S2 +, no rubs, no gallops Respiratory: CTA bilaterally, no wheezing, no rhonchi Abdominal: Soft, NT, ND, bowel sounds + Extremities: no edema, no cyanosis Neuro alert oriented x3    The results of significant diagnostics from this hospitalization (including imaging, microbiology, ancillary and laboratory) are listed below for reference.     Microbiology: Recent Results (from the past 240 hour(s))  MRSA PCR Screening     Status: None   Collection Time: 06/08/19  4:47 PM   Specimen: Nasal Mucosa; Nasopharyngeal  Result Value Ref Range Status   MRSA by PCR NEGATIVE NEGATIVE Final    Comment:        The GeneXpert MRSA Assay (FDA approved for NASAL specimens only), is one component of a comprehensive MRSA colonization surveillance program. It is not intended to diagnose MRSA infection nor to guide or monitor treatment for MRSA infections. Performed at [facility], [place], [place] [address]   SARS CORONAVIRUS 2 (TAT 6-24 HRS) Nasopharyngeal Nasopharyngeal Swab     Status: Abnormal   Collection Time: 06/09/19  3:15 PM   Specimen: Nasopharyngeal Swab  Result Value Ref Range Status   SARS Coronavirus 2 POSITIVE (A) NEGATIVE Final    Comment: RESULT CALLED TO, READ BACK BY AND VERIFIED WITH: STEPHANIE SUMMERS RN.@1327  ON 11.29.2020 BY TCALDWELL MT. (NOTE) SARS-CoV-2 target nucleic acids are DETECTED. The SARS-CoV-2 RNA is generally detectable in upper and lower respiratory specimens during the acute phase of infection. Positive results are indicative of the  presence of SARS-CoV-2 RNA. Clinical correlation with patient history and other diagnostic information is  necessary to determine patient infection status. Positive results do not rule out bacterial infection or co-infection with other viruses.  The expected result is Negative. Fact Sheet for Patients: SugarRoll.be Fact Sheet for Healthcare Providers: https://www.woods-mathews.com/ This test is not yet approved or cleared by the [place] FDA and  has been authorized for detection and/or diagnosis of SARS-CoV-2 by FDA under an Emergency Use Authorization (EUA). This EUA will remain  in effect (meaning this tes t can be used) for the duration of the COVID-19 declaration under Section 564(b)(1) of the Act, 21 U.S.C. section 360bbb-3(b)(1), unless the authorization is terminated or revoked sooner. Performed at [facility], [place] [address], [place], [place] [address]      Labs: BNP (last  3 results) Recent Labs    06/08/19 1120 06/10/19 0551  BNP 799.0* 123XX123*   Basic Metabolic Panel: Recent Labs  Lab 06/08/19 1120 06/09/19 0605 06/10/19 0551 06/11/19 0449  NA 141 141 140 140  K 3.5 3.4* 4.5 3.5  CL 105 104 103 102  CO2 26 29 27 26   GLUCOSE 122* 96 95 92  BUN 20 18 24* 29*  CREATININE 0.74 0.65 0.65 0.69  CALCIUM 8.5* 8.5* 9.0 8.6*  MG  --  2.1 2.1 2.3   Liver Function Tests: Recent Labs  Lab 06/08/19 1120  AST 19  ALT 17  ALKPHOS 68  BILITOT 0.8  PROT 6.0*  ALBUMIN 3.3*   No results for input(s): LIPASE, AMYLASE in the last 168 hours. No results for input(s): AMMONIA in the last 168 hours. CBC: Recent Labs  Lab 06/08/19 1120 06/09/19 0605 06/10/19 0551 06/11/19 0449  WBC 11.9* 8.9 9.1 10.2  NEUTROABS 10.9*  --   --   --   HGB 8.1* 8.8* 8.9* 9.1*  HCT 27.6* 29.0* 29.5* 29.9*  MCV 87.3 86.6 85.5 85.4  PLT 481* 458* 447* 453*   Cardiac Enzymes: No results for input(s): CKTOTAL, CKMB, CKMBINDEX,  TROPONINI in the last 168 hours. BNP: Invalid input(s): POCBNP CBG: Recent Labs  Lab 06/09/19 0809 06/10/19 0807 06/11/19 0820  GLUCAP 87 85 116*   D-Dimer No results for input(s): DDIMER in the last 72 hours. Hgb A1c No results for input(s): HGBA1C in the last 72 hours. Lipid Profile No results for input(s): CHOL, HDL, LDLCALC, TRIG, CHOLHDL, LDLDIRECT in the last 72 hours. Thyroid function studies Recent Labs    06/08/19 1120  TSH 4.813*   Anemia work up No results for input(s): VITAMINB12, FOLATE, FERRITIN, TIBC, IRON, RETICCTPCT in the last 72 hours. Urinalysis No results found for: COLORURINE, APPEARANCEUR, [place], [place], [place], [place], [place], [place], PROTEINUR, UROBILINOGEN, NITRITE, LEUKOCYTESUR Sepsis Labs Invalid input(s): PROCALCITONIN,  WBC,  LACTICIDVEN Microbiology Recent Results (from the past 240 hour(s))  MRSA PCR Screening     Status: None   Collection Time: 06/08/19  4:47 PM   Specimen: Nasal Mucosa; Nasopharyngeal  Result Value Ref Range Status   MRSA by PCR NEGATIVE NEGATIVE Final    Comment:        The GeneXpert MRSA Assay (FDA approved for NASAL specimens only), is one component of a comprehensive MRSA colonization surveillance program. It is not intended to diagnose MRSA infection nor to guide or monitor treatment for MRSA infections. Performed at [facility], [place], [place] [address]   SARS CORONAVIRUS 2 (TAT 6-24 HRS) Nasopharyngeal Nasopharyngeal Swab     Status: Abnormal   Collection Time: 06/09/19  3:15 PM   Specimen: Nasopharyngeal Swab  Result Value Ref Range Status   SARS Coronavirus 2 POSITIVE (A) NEGATIVE Final    Comment: RESULT CALLED TO, READ BACK BY AND VERIFIED WITH: STEPHANIE SUMMERS RN.@1327  ON 11.29.2020 BY TCALDWELL MT. (NOTE) SARS-CoV-2 target nucleic acids are DETECTED. The SARS-CoV-2 RNA is generally detectable in upper and lower respiratory specimens during the acute  phase of infection. Positive results are indicative of the presence of SARS-CoV-2 RNA. Clinical correlation with patient history and other diagnostic information is  necessary to determine patient infection status. Positive results do not rule out bacterial infection or co-infection with other viruses.  The expected result is Negative. Fact Sheet for Patients: SugarRoll.be Fact Sheet for Healthcare Providers: https://www.woods-mathews.com/ This test is not yet approved or cleared by the [place]  States FDA and  has been authorized for detection and/or diagnosis of SARS-CoV-2 by FDA under an Emergency Use Authorization (EUA). This EUA will remain  in effect (meaning this tes t can be used) for the duration of the COVID-19 declaration under Section 564(b)(1) of the Act, 21 U.S.C. section 360bbb-3(b)(1), unless the authorization is terminated or revoked sooner. Performed at [facility], [place] [address], [place], [place] [address]      Time coordinating discharge: Over 30 minutes  SIGNED:   Nolberto Hanlon, MD  [facility] 06/11/2019, 10:29 AM Pager   If 7PM-7AM, please contact night-coverage [email or web address] Password [email or web address]

## 2019-06-11 NOTE — Consult Note (Addendum)
Judith Keller for Warfarin Dosing Indication: atrial fibrillation  No Known Allergies  Patient Measurements: Height: 5\' 5"  (165.1 cm) Weight: 128 lb 15.5 oz (58.5 kg) IBW/kg (Calculated) : 57 Heparin Dosing Weight: N/A  Vital Signs: Temp: 97.6 F (36.4 C) (11/30 0816) Temp Source: Oral (11/30 0816) BP: 101/62 (11/30 0816) Pulse Rate: 83 (11/30 0816)  Labs: Recent Labs    06/08/19 1120 06/08/19 1307  06/08/19 1549 06/09/19 0605 06/10/19 0551 06/11/19 0449  HGB 8.1*  --   --   --  8.8* 8.9* 9.1*  HCT 27.6*  --   --   --  29.0* 29.5* 29.9*  PLT 481*  --   --   --  458* 447* 453*  APTT  --   --   --  39*  --   --   --   LABPROT  --   --    < > 23.4* 24.4* 27.6* 26.3*  INR  --   --    < > 2.1* 2.2* 2.6* 2.4*  CREATININE 0.74  --   --   --  0.65 0.65 0.69  TROPONINIHS 9 9  --   --   --   --   --    < > = values in this interval not displayed.    Estimated Creatinine Clearance: 40.4 mL/min (by C-G formula based on SCr of 0.69 mg/dL).   Medical History: Past Medical History:  Diagnosis Date  . Atrial fibrillation ([place])   . Heart failure ([place])   . Hyperlipidemia   . Hypertension   . Hypokalemia   . Osteoarthritis     Medications:  Medications Prior to Admission  Medication Sig Dispense Refill Last Dose  . acetaminophen (TYLENOL) 325 MG tablet Take 650 mg by mouth every 6 (six) hours as needed for mild pain.   prn at prn  . albuterol (VENTOLIN HFA) 108 (90 Base) MCG/ACT inhaler Inhale 1 puff into the lungs every 4 (four) hours as needed for wheezing or shortness of breath.   prn at prn  . aspirin EC 81 MG tablet Take 1 tablet (81 mg total) by mouth daily. 150 tablet 2 06/08/2019 at 0900  . cholecalciferol (VITAMIN D) 25 MCG (1000 UT) tablet Take 2,000 Units by mouth daily.   06/08/2019 at 0900  . citalopram (CELEXA) 10 MG tablet Take 10 mg by mouth daily.   06/08/2019 at 0900  . diltiazem (DILACOR XR) 120 MG 24 hr capsule Take 120  mg by mouth daily.   06/08/2019 at 0900  . fluticasone (FLONASE) 50 MCG/ACT nasal spray Place 2 sprays into both nostrils at bedtime.   prn at prn  . furosemide (LASIX) 20 MG tablet Take 40 mg by mouth daily.   06/08/2019 at 0800  . Melatonin 3 MG TABS Take 3 mg by mouth at bedtime.   06/07/2019 at 2000  . metoprolol succinate (TOPROL-XL) 25 MG 24 hr tablet Take 50 mg by mouth daily. Take with or immediately following a meal.    06/08/2019 at 0800  . Multiple Vitamin (MULTIVITAMIN WITH MINERALS) TABS tablet Take 1 tablet by mouth daily.   06/08/2019 at 0900  . polyethylene glycol (MIRALAX / GLYCOLAX) 17 g packet Take 17 g by mouth daily.   prn at prn  . predniSONE (DELTASONE) 5 MG tablet Take 5 mg by mouth daily with breakfast.   06/08/2019 at 0900  . simvastatin (ZOCOR) 20 MG tablet Take 20 mg by mouth at  bedtime.    06/07/2019 at 2000  . traMADol (ULTRAM) 50 MG tablet Take 1 tablet (50 mg total) by mouth every 6 (six) hours as needed for moderate pain. 20 tablet 0 prn at prn  . warfarin (COUMADIN) 5 MG tablet Take 1 tablet (5 mg total) by mouth one time only at 6 PM for 4 days. (Patient taking differently: Take 5 mg by mouth daily. ) 4 tablet 0 06/07/2019 at 1900   Scheduled:  . albuterol  2 puff Inhalation Once  . aspirin EC  81 mg Oral Daily  . atorvastatin  10 mg Oral QHS  . citalopram  10 mg Oral Daily  . diltiazem  120 mg Oral Daily  . furosemide  20 mg Oral Daily  . metoprolol succinate  50 mg Oral Daily  . polyethylene glycol  17 g Oral Daily  . predniSONE  5 mg Oral Q breakfast  . warfarin  5 mg Oral q1800  . Warfarin - Pharmacist Dosing Inpatient   Does not apply q1800   Infusions:   PRN: acetaminophen **OR** acetaminophen, hydrALAZINE, ondansetron **OR** ondansetron (ZOFRAN) IV, polyethylene glycol, senna-docusate, traMADol Anti-infectives (From admission, onward)   None      Assessment: Pharmacy has been consulted to dose Warfarin for 85yo patient with shortness of  breath. Patient was diagnosed with COVID the beginning of last month. Denies any chest pain currently. Noted to have small drop in hemoglobin from baseline but may be dilutional. Patient currently takes Warfarin 5mg  daily. Last dose taken was 11/26@1900 . Baseline labs have been ordered.  11/27: INR 2.1   5 mg 11/28: INR 2.2   5 mg 11/29  INR 2.6   Not given 11/30  INR 2.4  Goal of Therapy:  INR 2-3 Monitor platelets by anticoagulation protocol: Yes   Plan:  Patient is currently therapeutic. Patient did not receive warfarin dose 11/29 pm but no documentation as to why. Will continue with home dose of Warfarin 5mg .  CHF patient. Prednisone ordered  Will check INR with AM labs.  Tawnya Crook, PharmD 06/11/2019,8:21 AM

## 2019-06-11 NOTE — TOC Transition Note (Addendum)
Transition of Care [facility]) - CM/SW Discharge Note   Patient Details  Name: Judith Keller MRN: [id number] Date of Birth: Feb 21, 1925  [facility]) CM/SW Contact:  Ross Ludwig, LCSW Phone Number: 06/11/2019, 1:10 PM   Clinical Narrative:     Patient is a long term care resident at [facility].  Patient is a 85 year old female who has been at [facility] for a couple of years.  Patient and son did not express any concerns about returning back to SNF.  Patient has tested positive for Covid in October, however, patient is not contagious anymore according to ID physician and she does not have to be on isolation precautions.  CSW spoke to [facility] and they can accept patient back today, CSW updated patient's son Judith Keller [phone].  Patient's son is aware that she is returning back to SNF today via EMS.  Patient will be going to room 301A, nurse to call report to [phone].   Final next level of care: [place] Barriers to Discharge: Barriers Resolved   Patient Goals and CMS Choice Patient states their goals for this hospitalization and ongoing recovery are:: To return back to [facility] [place] where she is a long term care resident. CMS Medicare.gov Compare Post Acute Care list provided to:: Patient Represenative (must comment) Choice offered to / list presented to : Adult Children  Discharge Placement  Patient is discharging back to SNF today.                Discharge Plan and Services In-house Referral: NA Discharge Planning Services: NA Post Acute Care Choice: Skilled Nursing Facility          DME Arranged: N/A         HH Arranged: NA          Social Determinants of Health (SDOH) Interventions     Readmission Risk Interventions Readmission Risk Prevention Plan 11/18/2018 11/17/2018  Transportation Screening Complete Complete  PCP or Specialist Appt within 3-5 Days Complete -  HRI or [place] Not Complete Complete  HRI  or Home Care Consult comments Patient returning to SNF. -  Social Work Consult for [place] Planning/Counseling Complete Complete  Palliative Care Screening Not Applicable -  Medication Review Press photographer) Complete Complete  Some recent data might be hidden

## 2019-06-11 NOTE — Progress Notes (Signed)
Report called to [facility], EMS called for transportation. Patient prepared and awaiting transport.

## 2019-06-11 NOTE — NC FL2 (Signed)
[place] LEVEL OF CARE SCREENING TOOL     IDENTIFICATION  Patient Name: Judith Keller Birthdate: 1924/09/16 Sex: female Admission Date (Current Location): 06/08/2019  [place] and [place] Number:  Selena Lesser [id number] [place] and Address:  [facility], [address], [place], [place] [address]      Provider Number: [id number]  Attending Physician Name and Address:  Nolberto Hanlon, MD  Relative Name and Phone Number:  NOURA, SALIZAR [phone]    Current Level of Care: Hospital Recommended Level of Care: [place] Prior Approval Number:    Date Approved/Denied:   PASRR Number: [id number]  Discharge Plan: SNF    Current Diagnoses: Patient Active Problem List   Diagnosis Date Noted  . Pressure injury of skin 06/09/2019  . CHF (congestive heart failure) ([place]) 06/09/2019  . Acute exacerbation of CHF (congestive heart failure) ([place]) 06/08/2019  . Acute respiratory distress 06/08/2019  . Hypoxia 06/08/2019  . Elevated brain natriuretic peptide (BNP) level 06/08/2019  . Atrial fibrillation, chronic ([place]) 06/08/2019  . Polymyalgia rheumatica ([place]) 06/08/2019  . CAD (coronary artery disease) 06/08/2019  . S/P right hip fracture 11/15/2018    Orientation RESPIRATION BLADDER Height & Weight     Self, Place  O2(3L) Incontinent Weight: 128 lb 15.5 oz (58.5 kg) Height:  5\' 5"  (165.1 cm)  BEHAVIORAL SYMPTOMS/MOOD NEUROLOGICAL BOWEL NUTRITION STATUS      Continent Diet  AMBULATORY STATUS COMMUNICATION OF NEEDS Skin   Limited Assist Verbally PU Stage and Appropriate Care PU Stage 1 Dressing: (Every three days)                     Personal Care Assistance Level of Assistance  Bathing, Feeding, Dressing Bathing Assistance: Limited assistance Feeding assistance: Limited assistance Dressing Assistance: Limited assistance     Functional Limitations Info  Sight, Speech, Hearing Sight Info: Adequate Hearing Info:  Adequate Speech Info: Adequate    SPECIAL CARE FACTORS FREQUENCY                       Contractures Contractures Info: Not present    Additional Factors Info  Code Status, Allergies, Psychotropic Code Status Info: DNR Allergies Info: NKA Psychotropic Info: citalopram (CELEXA) tablet 10 mg         Current Medications (06/11/2019):  This is the current hospital active medication list Current Facility-Administered Medications  Medication Dose Route Frequency Provider Last Rate Last Dose  . acetaminophen (TYLENOL) tablet 650 mg  650 mg Oral Q6H PRN Amin, Ankit Chirag, MD   650 mg at 06/11/19 0857   Or  . acetaminophen (TYLENOL) suppository 650 mg  650 mg Rectal Q6H PRN Amin, Ankit Chirag, MD      . albuterol (VENTOLIN HFA) 108 (90 Base) MCG/ACT inhaler 2 puff  2 puff Inhalation Once Merlyn Lot, MD      . aspirin EC tablet 81 mg  81 mg Oral Daily Amin, Ankit Chirag, MD   81 mg at 06/11/19 0855  . atorvastatin (LIPITOR) tablet 10 mg  10 mg Oral QHS Dallie Piles, RPH   10 mg at 06/10/19 2045  . citalopram (CELEXA) tablet 10 mg  10 mg Oral Daily Amin, Ankit Chirag, MD   10 mg at 06/11/19 0855  . diltiazem (DILACOR XR) 24 hr capsule 120 mg  120 mg Oral Daily Amin, Ankit Chirag, MD   120 mg at 06/11/19 0855  . furosemide (LASIX) tablet 20 mg  20 mg Oral  Daily Nolberto Hanlon, MD   20 mg at 06/11/19 0855  . hydrALAZINE (APRESOLINE) injection 10 mg  10 mg Intravenous Q4H PRN Amin, Ankit Chirag, MD      . metoprolol succinate (TOPROL-XL) 24 hr tablet 50 mg  50 mg Oral Daily Amin, Ankit Chirag, MD   50 mg at 06/11/19 0855  . ondansetron (ZOFRAN) tablet 4 mg  4 mg Oral Q6H PRN Amin, Ankit Chirag, MD       Or  . ondansetron (ZOFRAN) injection 4 mg  4 mg Intravenous Q6H PRN Amin, Ankit Chirag, MD      . polyethylene glycol (MIRALAX / GLYCOLAX) packet 17 g  17 g Oral Daily PRN Amin, Ankit Chirag, MD      . polyethylene glycol (MIRALAX / GLYCOLAX) packet 17 g  17 g Oral Daily Amin,  Ankit Chirag, MD   17 g at 06/11/19 0856  . predniSONE (DELTASONE) tablet 5 mg  5 mg Oral Q breakfast Amin, Ankit Chirag, MD   5 mg at 06/11/19 0855  . senna-docusate (Senokot-S) tablet 2 tablet  2 tablet Oral QHS PRN Amin, Ankit Chirag, MD      . traMADol (ULTRAM) tablet 50 mg  50 mg Oral Q6H PRN Amin, Ankit Chirag, MD      . warfarin (COUMADIN) tablet 5 mg  5 mg Oral q1800 Amin, Ankit Chirag, MD   5 mg at 06/09/19 1710  . Warfarin - Pharmacist Dosing Inpatient   Does not apply q1800 Pearla Dubonnet, [facility]         Discharge Medications: Please see discharge summary for a list of discharge medications.  Relevant Imaging Results:  Relevant Lab Results:   Additional Information SSN [id number]  Ross Ludwig, LCSW

## 2019-06-20 ENCOUNTER — Ambulatory Visit: Payer: Medicare Other | Admitting: Nurse Practitioner

## 2019-06-21 ENCOUNTER — Ambulatory Visit: Payer: Medicare Other | Admitting: Family

## 2019-07-02 ENCOUNTER — Ambulatory Visit: Payer: Medicare Other | Admitting: Family

## 2019-07-14 ENCOUNTER — Inpatient Hospital Stay
Admission: EM | Admit: 2019-07-14 | Discharge: 2019-07-17 | DRG: 291 | Disposition: A | Discharge status: Skilled Nursing Facility | Payer: Medicare Other | Source: Skilled Nursing Facility | Attending: Family Medicine | Admitting: Family Medicine

## 2019-07-14 ENCOUNTER — Emergency Department: Payer: Medicare Other

## 2019-07-14 ENCOUNTER — Other Ambulatory Visit: Payer: Self-pay

## 2019-07-14 ENCOUNTER — Encounter: Payer: Self-pay | Admitting: Internal Medicine

## 2019-07-14 DIAGNOSIS — Z7952 Long term (current) use of systemic steroids: Secondary | ICD-10-CM

## 2019-07-14 DIAGNOSIS — Z7982 Long term (current) use of aspirin: Secondary | ICD-10-CM | POA: Diagnosis not present

## 2019-07-14 DIAGNOSIS — I482 Chronic atrial fibrillation, unspecified: Secondary | ICD-10-CM | POA: Diagnosis present

## 2019-07-14 DIAGNOSIS — Z8616 Personal history of COVID-19: Secondary | ICD-10-CM

## 2019-07-14 DIAGNOSIS — I2510 Atherosclerotic heart disease of native coronary artery without angina pectoris: Secondary | ICD-10-CM | POA: Diagnosis present

## 2019-07-14 DIAGNOSIS — I110 Hypertensive heart disease with heart failure: Principal | ICD-10-CM | POA: Diagnosis present

## 2019-07-14 DIAGNOSIS — J9621 Acute and chronic respiratory failure with hypoxia: Secondary | ICD-10-CM | POA: Diagnosis present

## 2019-07-14 DIAGNOSIS — T68XXXA Hypothermia, initial encounter: Secondary | ICD-10-CM | POA: Diagnosis present

## 2019-07-14 DIAGNOSIS — Z7901 Long term (current) use of anticoagulants: Secondary | ICD-10-CM

## 2019-07-14 DIAGNOSIS — D72829 Elevated white blood cell count, unspecified: Secondary | ICD-10-CM | POA: Diagnosis present

## 2019-07-14 DIAGNOSIS — E876 Hypokalemia: Secondary | ICD-10-CM | POA: Diagnosis present

## 2019-07-14 DIAGNOSIS — I509 Heart failure, unspecified: Secondary | ICD-10-CM

## 2019-07-14 DIAGNOSIS — M353 Polymyalgia rheumatica: Secondary | ICD-10-CM | POA: Diagnosis present

## 2019-07-14 DIAGNOSIS — Z66 Do not resuscitate: Secondary | ICD-10-CM | POA: Diagnosis present

## 2019-07-14 DIAGNOSIS — Z79899 Other long term (current) drug therapy: Secondary | ICD-10-CM | POA: Diagnosis not present

## 2019-07-14 DIAGNOSIS — I5043 Acute on chronic combined systolic (congestive) and diastolic (congestive) heart failure: Secondary | ICD-10-CM | POA: Diagnosis not present

## 2019-07-14 DIAGNOSIS — E785 Hyperlipidemia, unspecified: Secondary | ICD-10-CM | POA: Diagnosis present

## 2019-07-14 DIAGNOSIS — Z96641 Presence of right artificial hip joint: Secondary | ICD-10-CM | POA: Diagnosis present

## 2019-07-14 DIAGNOSIS — R791 Abnormal coagulation profile: Secondary | ICD-10-CM | POA: Diagnosis present

## 2019-07-14 DIAGNOSIS — U071 COVID-19: Secondary | ICD-10-CM | POA: Diagnosis present

## 2019-07-14 LAB — BRAIN NATRIURETIC PEPTIDE: B Natriuretic Peptide: 844 pg/mL — ABNORMAL HIGH (ref 0.0–100.0)

## 2019-07-14 LAB — CBC
HCT: 30.8 % — ABNORMAL LOW (ref 36.0–46.0)
Hemoglobin: 8.5 g/dL — ABNORMAL LOW (ref 12.0–15.0)
MCH: 23.2 pg — ABNORMAL LOW (ref 26.0–34.0)
MCHC: 27.6 g/dL — ABNORMAL LOW (ref 30.0–36.0)
MCV: 83.9 fL (ref 80.0–100.0)
Platelets: 462 10*3/uL — ABNORMAL HIGH (ref 150–400)
RBC: 3.67 MIL/uL — ABNORMAL LOW (ref 3.87–5.11)
RDW: 16.7 % — ABNORMAL HIGH (ref 11.5–15.5)
WBC: 12.7 10*3/uL — ABNORMAL HIGH (ref 4.0–10.5)
nRBC: 0 % (ref 0.0–0.2)

## 2019-07-14 LAB — BASIC METABOLIC PANEL
Anion gap: 10 (ref 5–15)
BUN: 17 mg/dL (ref 8–23)
CO2: 29 mmol/L (ref 22–32)
Calcium: 8.7 mg/dL — ABNORMAL LOW (ref 8.9–10.3)
Chloride: 102 mmol/L (ref 98–111)
Creatinine, Ser: 0.5 mg/dL (ref 0.44–1.00)
GFR calc Af Amer: >60 mL/min (ref >60)
GFR calc non Af Amer: >60 mL/min (ref >60)
Glucose, Bld: 123 mg/dL — ABNORMAL HIGH (ref 70–99)
Potassium: 3.8 mmol/L (ref 3.5–5.1)
Sodium: 141 mmol/L (ref 135–145)

## 2019-07-14 LAB — PROTIME-INR
INR: 1.7 — ABNORMAL HIGH (ref 0.8–1.2)
Prothrombin Time: 19.4 seconds — ABNORMAL HIGH (ref 11.4–15.2)

## 2019-07-14 LAB — TROPONIN I (HIGH SENSITIVITY): Troponin I (High Sensitivity): 12 ng/L (ref <18)

## 2019-07-14 MED ORDER — ASCORBIC ACID 500 MG PO TABS
500.0000 mg | ORAL_TABLET | Freq: Every day | ORAL | Status: DC
  Administered 2019-07-15 – 2019-07-17 (×3): 500 mg via ORAL
  Filled 2019-07-14 (×3): qty 1

## 2019-07-14 MED ORDER — ONDANSETRON HCL 4 MG/2ML IJ SOLN: 4.0000 mg | Freq: Three times a day (TID) | INTRAMUSCULAR | Status: DC | PRN

## 2019-07-14 MED ORDER — DILTIAZEM HCL ER COATED BEADS 120 MG PO CP24
120.0000 mg | ORAL_CAPSULE | Freq: Every day | ORAL | Status: DC
  Administered 2019-07-15 – 2019-07-17 (×3): 120 mg via ORAL
  Filled 2019-07-14 (×3): qty 1

## 2019-07-14 MED ORDER — FLUTICASONE PROPIONATE 50 MCG/ACT NA SUSP
2.0000 | Freq: Every day | NASAL | Status: DC
  Administered 2019-07-15 – 2019-07-16 (×3): 2 via NASAL
  Filled 2019-07-14: qty 16

## 2019-07-14 MED ORDER — DM-GUAIFENESIN ER 30-600 MG PO TB12: 1.0000 | ORAL_TABLET | Freq: Two times a day (BID) | ORAL | Status: DC

## 2019-07-14 MED ORDER — VITAMIN D 25 MCG (1000 UNIT) PO TABS
2000.0000 [IU] | ORAL_TABLET | Freq: Every day | ORAL | Status: DC
  Administered 2019-07-15 – 2019-07-17 (×3): 2000 [IU] via ORAL
  Filled 2019-07-14 (×3): qty 2

## 2019-07-14 MED ORDER — DM-GUAIFENESIN ER 30-600 MG PO TB12
1.0000 | ORAL_TABLET | Freq: Two times a day (BID) | ORAL | Status: DC
  Administered 2019-07-14 – 2019-07-15 (×3): 1 via ORAL
  Filled 2019-07-14 (×5): qty 1

## 2019-07-14 MED ORDER — CITALOPRAM HYDROBROMIDE 20 MG PO TABS
10.0000 mg | ORAL_TABLET | Freq: Every day | ORAL | Status: DC
  Administered 2019-07-15 – 2019-07-17 (×3): 10 mg via ORAL
  Filled 2019-07-14 (×3): qty 1

## 2019-07-14 MED ORDER — POLYETHYLENE GLYCOL 3350 17 G PO PACK: 17.0000 g | PACK | Freq: Every day | ORAL | Status: DC | PRN

## 2019-07-14 MED ORDER — HYDROCORTISONE NA SUCCINATE PF 100 MG IJ SOLR
50.0000 mg | Freq: Once | INTRAMUSCULAR | Status: AC
  Administered 2019-07-14: 12:00:00 50 mg via INTRAVENOUS
  Filled 2019-07-14: qty 2

## 2019-07-14 MED ORDER — ASPIRIN EC 81 MG PO TBEC
81.0000 mg | DELAYED_RELEASE_TABLET | Freq: Every day | ORAL | Status: DC
  Administered 2019-07-14 – 2019-07-17 (×4): 81 mg via ORAL
  Filled 2019-07-14 (×4): qty 1

## 2019-07-14 MED ORDER — FUROSEMIDE 10 MG/ML IJ SOLN
60.0000 mg | Freq: Once | INTRAMUSCULAR | Status: AC
  Administered 2019-07-14: 11:00:00 60 mg via INTRAVENOUS
  Filled 2019-07-14: qty 8

## 2019-07-14 MED ORDER — WARFARIN SODIUM 3 MG PO TABS
3.5000 mg | ORAL_TABLET | Freq: Once | ORAL | Status: AC
  Administered 2019-07-15: 03:00:00 3.5 mg via ORAL
  Filled 2019-07-14 (×2): qty 1

## 2019-07-14 MED ORDER — PREDNISONE 10 MG PO TABS
15.0000 mg | ORAL_TABLET | Freq: Every day | ORAL | Status: DC
  Administered 2019-07-15 – 2019-07-17 (×3): 15 mg via ORAL
  Filled 2019-07-14 (×4): qty 2

## 2019-07-14 MED ORDER — SIMVASTATIN 20 MG PO TABS
20.0000 mg | ORAL_TABLET | Freq: Every day | ORAL | Status: DC
  Administered 2019-07-15 – 2019-07-16 (×3): 20 mg via ORAL
  Filled 2019-07-14 (×3): qty 1

## 2019-07-14 MED ORDER — SODIUM CHLORIDE 0.9% FLUSH
3.0000 mL | Freq: Two times a day (BID) | INTRAVENOUS | Status: DC
  Administered 2019-07-15 – 2019-07-17 (×6): 3 mL via INTRAVENOUS

## 2019-07-14 MED ORDER — HYDRALAZINE HCL 25 MG PO TABS: 25.0000 mg | ORAL_TABLET | Freq: Three times a day (TID) | ORAL | Status: DC | PRN

## 2019-07-14 MED ORDER — METOPROLOL SUCCINATE ER 50 MG PO TB24
50.0000 mg | ORAL_TABLET | Freq: Every day | ORAL | Status: DC
  Administered 2019-07-15 – 2019-07-17 (×3): 50 mg via ORAL
  Filled 2019-07-14 (×3): qty 1

## 2019-07-14 MED ORDER — SODIUM CHLORIDE 0.9 % IV SOLN: 250.0000 mL | INTRAVENOUS | Status: DC | PRN

## 2019-07-14 MED ORDER — FUROSEMIDE 10 MG/ML IJ SOLN
20.0000 mg | Freq: Two times a day (BID) | INTRAMUSCULAR | Status: DC
  Administered 2019-07-15 – 2019-07-17 (×6): 20 mg via INTRAVENOUS
  Filled 2019-07-14 (×6): qty 2

## 2019-07-14 MED ORDER — ADULT MULTIVITAMIN W/MINERALS CH
1.0000 | ORAL_TABLET | Freq: Every day | ORAL | Status: DC
  Administered 2019-07-15 – 2019-07-17 (×3): 1 via ORAL
  Filled 2019-07-14 (×3): qty 1

## 2019-07-14 MED ORDER — ACETAMINOPHEN 325 MG PO TABS
650.0000 mg | ORAL_TABLET | Freq: Four times a day (QID) | ORAL | Status: DC | PRN
  Administered 2019-07-15: 11:00:00 650 mg via ORAL
  Filled 2019-07-14 (×2): qty 2

## 2019-07-14 MED ORDER — IPRATROPIUM BROMIDE HFA 17 MCG/ACT IN AERS
2.0000 | INHALATION_SPRAY | RESPIRATORY_TRACT | Status: DC
  Administered 2019-07-14 – 2019-07-17 (×17): 2 via RESPIRATORY_TRACT
  Filled 2019-07-14 (×2): qty 12.9

## 2019-07-14 MED ORDER — SODIUM CHLORIDE 0.9% FLUSH: 3.0000 mL | INTRAVENOUS | Status: DC | PRN

## 2019-07-14 MED ORDER — MELATONIN 5 MG PO TABS
5.0000 mg | ORAL_TABLET | Freq: Every day | ORAL | Status: DC
  Administered 2019-07-15 – 2019-07-16 (×3): 5 mg via ORAL
  Filled 2019-07-14 (×4): qty 1

## 2019-07-14 MED ORDER — WARFARIN - PHARMACIST DOSING INPATIENT
Freq: Every day | Status: DC
  Filled 2019-07-14: qty 1

## 2019-07-14 MED ORDER — ALBUTEROL SULFATE HFA 108 (90 BASE) MCG/ACT IN AERS
2.0000 | INHALATION_SPRAY | RESPIRATORY_TRACT | Status: DC | PRN
  Administered 2019-07-15: 22:00:00 2 via RESPIRATORY_TRACT
  Filled 2019-07-14 (×2): qty 6.7

## 2019-07-14 MED ORDER — LISINOPRIL 5 MG PO TABS
2.5000 mg | ORAL_TABLET | Freq: Every day | ORAL | Status: DC
  Administered 2019-07-14 – 2019-07-17 (×4): 2.5 mg via ORAL
  Filled 2019-07-14 (×4): qty 1

## 2019-07-14 MED ORDER — TRAMADOL HCL 50 MG PO TABS: 50.0000 mg | ORAL_TABLET | Freq: Two times a day (BID) | ORAL | Status: DC | PRN

## 2019-07-14 NOTE — H&P (Signed)
History and Physical    Judith Keller [id number] DOB: [birth year] DOA: 07/14/2019  Referring MD/NP/PA:   PCP: Harlow Ohms, MD   Patient coming from:  The patient is coming from SNF.  At baseline, pt is dependent for most of ADL.        Chief Complaint: SOB  HPI: Judith Keller is a 86 y.o. female with medical history significant of CHF with EF 40-45%, hypertension, hyperlipidemia, atrial fibrillation on Coumadin, PMR on prednisone, CAD, COVID-19 infection in October (still PCR positive on 06/09/2019), who presents with shortness breath.  Pt was COVID positive in October and still PCR positive on 06/09/19. She has been on oxygen chronically ever since. She developed worsening SOB since yesterday. No cough, fever or chills. No CP. No nausea, vomiting, diarrhea or abdominal pain. EMS report pt was on 8L upon arrival at 88%.   ED Course: pt was found to have BNP 844, troponin 12, WBC 12.7, electrolytes renal function okay, temperature 97.4, oxygen saturation 94% on 4L currently, blood pressure 157/83, tachycardia, tachypnea, chest x-ray with bilateral pleural effusion, questionable right lower lobe and left base airspace disease. Pt is admitted to telemetry bed as inpatient.  Review of Systems:   General: no fevers, chills, no body weight gain, has fatigue HEENT: no blurry vision, hearing changes or sore throat Respiratory: has dyspnea, no coughing, wheezing CV: no chest pain, no palpitations GI: no nausea, vomiting, abdominal pain, diarrhea, constipation GU: no dysuria, burning on urination, increased urinary frequency, hematuria  Ext: has trace leg edema Neuro: no unilateral weakness, numbness, or tingling, no vision change or hearing loss Skin: has bruise in the left lower leg MSK: No muscle spasm, no deformity, no limitation of range of movement in spin Heme: No easy bruising.  Travel history: No recent long distant travel.  Allergy: No Known Allergies  Past Medical History:   Diagnosis Date  . Atrial fibrillation ([place])   . Heart failure ([place])   . Hyperlipidemia   . Hypertension   . Hypokalemia   . Osteoarthritis     Past Surgical History:  Procedure Laterality Date  . FOOT SURGERY    . HIP ARTHROPLASTY Right 11/16/2018   Procedure: ARTHROPLASTY BIPOLAR HIP (HEMIARTHROPLASTY);  Surgeon: Lovell Sheehan, MD;  Location: [facility] ORS;  Service: Orthopedics;  Laterality: Right;  . MASTECTOMY      Social History:  reports that she has never smoked. She has never used smokeless tobacco. She reports previous alcohol use. She reports that she does not use drugs.  Family History: No family history on file.  Tried to review with patient, but patient cannot give any family medical history.  Prior to Admission medications   Medication Sig Start Date End Date Taking? Authorizing Provider  acetaminophen (TYLENOL) 325 MG tablet Take 650 mg by mouth every 6 (six) hours as needed for mild pain.    [provider]  albuterol (VENTOLIN HFA) 108 (90 Base) MCG/ACT inhaler Inhale 1 puff into the lungs every 4 (four) hours as needed for wheezing or shortness of breath.    [provider]  aspirin EC 81 MG tablet Take 1 tablet (81 mg total) by mouth daily. 11/18/18 11/18/19  Nicholes Mango, MD  cholecalciferol (VITAMIN D) 25 MCG (1000 UT) tablet Take 2,000 Units by mouth daily.    [provider]  citalopram (CELEXA) 10 MG tablet Take 10 mg by mouth daily.    [provider]  diltiazem (DILACOR XR) 120 MG 24 hr  capsule Take 120 mg by mouth daily.    [provider]  fluticasone (FLONASE) 50 MCG/ACT nasal spray Place 2 sprays into both nostrils at bedtime.    [provider]  furosemide (LASIX) 20 MG tablet Take 1 tablet (20 mg total) by mouth daily. 06/12/19   Nolberto Hanlon, MD  Melatonin 3 MG TABS Take 3 mg by mouth at bedtime.    [provider]  metoprolol succinate (TOPROL-XL) 25 MG 24 hr tablet Take 50 mg by mouth daily. Take  with or immediately following a meal.     [provider]  Multiple Vitamin (MULTIVITAMIN WITH MINERALS) TABS tablet Take 1 tablet by mouth daily.    [provider]  polyethylene glycol (MIRALAX / GLYCOLAX) 17 g packet Take 17 g by mouth daily.    [provider]  predniSONE (DELTASONE) 5 MG tablet Take 5 mg by mouth daily with breakfast.    [provider]  simvastatin (ZOCOR) 20 MG tablet Take 20 mg by mouth at bedtime.     [provider]  traMADol (ULTRAM) 50 MG tablet Take 1 tablet (50 mg total) by mouth every 6 (six) hours as needed for moderate pain. 11/18/18   Nicholes Mango, MD  warfarin (COUMADIN) 5 MG tablet Take 1 tablet (5 mg total) by mouth one time only at 6 PM for 4 days. Patient taking differently: Take 5 mg by mouth daily.  11/18/18 06/08/19  Nicholes Mango, MD    Physical Exam: Vitals:   07/14/19 0936 07/14/19 1000 07/14/19 1117 07/14/19 1212  BP:  (!) 156/89 109/71 117/68  Pulse:  (!) 113 (!) 102   Resp:  (!) 33 20   Temp:      TempSrc:      SpO2:  100% 100%   Weight: 63.5 kg     Height: 5\' 3"  (1.6 m)      General: Not in acute distress HEENT:       Eyes: PERRL, EOMI, no scleral icterus.       ENT: No discharge from the ears and nose, no pharynx injection, no tonsillar enlargement.        Neck: No JVD, no bruit, no mass felt. Heme: No neck lymph node enlargement. Cardiac: S1/S2, RRR, No murmurs, No gallops or rubs. Respiratory: has fine crackles bilaterally. GI: Soft, nondistended, nontender, no rebound pain, no organomegaly, BS present. GU: No hematuria Ext: has mild leg edema bilaterally. 2+DP/PT pulse bilaterally. Musculoskeletal: No joint deformities, No joint redness or warmth, no limitation of ROM in spin. Skin: No rashes.  Has bruise in the left lower leg Neuro: Alert, cranial nerves II-XII grossly intact, moves all extremities normally.  Psych: Patient is not psychotic, no suicidal or hemocidal ideation.  Labs on  Admission: I have personally reviewed following labs and imaging studies  CBC: Recent Labs  Lab 07/14/19 1011  WBC 12.7*  HGB 8.5*  HCT 30.8*  MCV 83.9  PLT [id number]   Basic Metabolic Panel: Recent Labs  Lab 07/14/19 1011  NA 141  K 3.8  CL 102  CO2 29  GLUCOSE 123*  BUN 17  CREATININE 0.50  CALCIUM 8.7*   GFR: Estimated Creatinine Clearance: 40.2 mL/min (by C-G formula based on SCr of 0.5 mg/dL). Liver Function Tests: No results for input(s): AST, ALT, ALKPHOS, BILITOT, PROT, ALBUMIN in the last 168 hours. No results for input(s): LIPASE, AMYLASE in the last 168 hours. No results for input(s): AMMONIA in the last 168 hours. Coagulation Profile:  No results for input(s): INR, PROTIME in the last 168 hours. Cardiac Enzymes: No results for input(s): CKTOTAL, CKMB, CKMBINDEX, TROPONINI in the last 168 hours. BNP (last 3 results) No results for input(s): PROBNP in the last 8760 hours. HbA1C: No results for input(s): HGBA1C in the last 72 hours. CBG: No results for input(s): GLUCAP in the last 168 hours. Lipid Profile: No results for input(s): CHOL, HDL, LDLCALC, TRIG, CHOLHDL, LDLDIRECT in the last 72 hours. Thyroid Function Tests: No results for input(s): TSH, T4TOTAL, FREET4, T3FREE, THYROIDAB in the last 72 hours. Anemia Panel: No results for input(s): VITAMINB12, FOLATE, FERRITIN, TIBC, IRON, RETICCTPCT in the last 72 hours. Urine analysis: No results found for: COLORURINE, APPEARANCEUR, LABSPEC, PHURINE, GLUCOSEU, HGBUR, BILIRUBINUR, KETONESUR, PROTEINUR, UROBILINOGEN, NITRITE, LEUKOCYTESUR Sepsis Labs: @LABRCNTIP (procalcitonin:4,lacticidven:4) )No results found for this or any previous visit (from the past 240 hour(s)).   Radiological Exams on Admission: DG Chest Portable 1 View  Result Date: 07/14/2019 CLINICAL DATA:  Dyspnea. History of atrial fibrillation. EXAM: PORTABLE CHEST 1 VIEW COMPARISON:  06/08/2019 FINDINGS: Stable cardiac enlargement. In a bilateral  pleural effusions are identified right greater than left. Atelectasis and/or airspace disease is noted within the right lower lobe. Mildly diminished aeration to left base also noted likely due to subsegmental atelectasis. Scoliosis and degenerative disc disease noted within the thoracolumbar spine. IMPRESSION: 1. Bilateral pleural effusions, right greater than left. 2. Right lower lobe and left base atelectasis and/or airspace disease. Electronically Signed   By: Kerby Moors M.D.   On: 07/14/2019 10:12     EKG:  Not done in ED, will get one.   Assessment/Plan Principal Problem:   Acute on chronic combined systolic and diastolic CHF (congestive heart failure) (HCC) Active Problems:   Atrial fibrillation, chronic (HCC)   Polymyalgia rheumatica (HCC)   CAD (coronary artery disease)   HLD (hyperlipidemia)   Hypothermia   Leucocytosis   COVID-19 virus infection   Acute on chronic respiratory failure with hypoxia (HCC)   Acute on chronic respiratory failure with hypoxia and acute on chronic combined systolic and diastolic CHF (congestive heart failure) ([place]): her SOB is most likely due to CHF exacerbation given elevated BNP of 844.  Patient had COVID-19 infection in October, still had positive PCR on 11/28, but patient does not have fever, less likely to be still contributed to current issue.    -will admit to tele as inpt. -Lasix 20 mg bid by IV (pt received 60 mg of IV lasix in ED) - will not get 2d echo since she had one on 06/08/19, which showed EF of 40-45% with grade 1 diastolic dysfunction -Daily weights -strict I/O's -Low salt diet -Fluid restriction  Atrial fibrillation, chronic ([place]): HR 100-110s -continue metoprolol and cardizem -Coumadin per pharm  Polymyalgia rheumatica ([place]): -will increase home prednisone from 5 to 15 mg daily -Give 1 dose of Solu-Cortef 50 mg as stress dose  CAD (coronary artery disease): -on ASA and zocor  HLD  (hyperlipidemia): -zocor  Hypothermia: -Bair Hugger  Leucocytosis: WBC 12.7. No fever.  Possibly due to steroid use -Follow-up of blood culture -Follow-up CBC  COVID-19 virus infection: Pt was COVID positive in October and still PCR positive on 06/09/19. No fever. -observe now       Inpatient status:  # Patient requires inpatient status due to high intensity of service, high risk for further deterioration and high frequency of surveillance required.  I certify that at the point of admission it is my clinical judgment that the patient  will require inpatient hospital care spanning beyond 2 midnights from the point of admission.  . This patient has multiple chronic comorbidities including CHF with EF 40-45%, hypertension, hyperlipidemia, atrial fibrillation on Coumadin, PMR on prednisone, CAD, COVID-19 infection in October (still PCR positive on 06/09/2019) . Now patient has presenting with acute on chronic respiratory failure with hypoxia and acute on chronic combined systolic and diastolic CHF. Also has leukocytosis and hypothermia . The worrisome physical exam findings include fine crackles on lung auscultation . The initial radiographic and laboratory data are worrisome because of elevated BNP 844, bilateral pleural effusion on chest x-ray. . Current medical needs: please see my assessment and plan . Predictability of an adverse outcome (risk): Patient has multiple comorbidities as listed above. Acute on chronic respiratory failure with hypoxia and acute on chronic combined systolic and diastolic CHF. Also has  leukocytosis and hypothermia. her presentation is highly complicated.  Patient is at high risk of deteriorating.  Will need to be treated in hospital for at least 2 days.            DVT ppx: on Coumadin Code Status: DNR Family Communication: None at bed side.    Disposition Plan:  Anticipate discharge back to previous SNF Consults called:  none Admission status:  SDU/inpation       Date of Service 07/14/2019    Ivor Costa [facility]   If 7PM-7AM, please contact night-coverage [email or web address] Password [email or web address] 07/14/2019, 1:46 PM

## 2019-07-14 NOTE — ED Triage Notes (Signed)
Pt arrived via EMS from [facility] d/t SOB. Pt was COVID positive in October and has been on oxygen chronically ever since. EMS report pt was on 8L upon arrival at 88%. Pt is A&O x4 at this time.

## 2019-07-14 NOTE — ED Notes (Signed)
Pt denies CP/SHO at this time.

## 2019-07-14 NOTE — ED Notes (Signed)
Pt titrated down to Nasal [place] 4L O2. Pt is currently saturating at 96%

## 2019-07-14 NOTE — [place] Notes (Signed)
ANTICOAGULATION CONSULT NOTE - Initial Consult  Pharmacy Consult for Warfarin  Indication: atrial fibrillation  No Known Allergies  Patient Measurements: Height: 5\' 3"  (160 cm) Weight: 140 lb (63.5 kg) IBW/kg (Calculated) : 52.4 Heparin Dosing Weight:   Vital Signs: Temp: 97.4 F (36.3 C) (01/02 0933) Temp Source: Oral (01/02 0933) BP: 135/76 (01/02 1800) Pulse Rate: 100 (01/02 1600)  Labs: Recent Labs    07/14/19 1011 07/14/19 1912  HGB 8.5*  --   HCT 30.8*  --   PLT 462*  --   LABPROT  --  19.4*  INR  --  1.7*  CREATININE 0.50  --   TROPONINIHS 12  --     Estimated Creatinine Clearance: 40.2 mL/min (by C-G formula based on SCr of 0.5 mg/dL).   Medical History: Past Medical History:  Diagnosis Date  . Atrial fibrillation ([place])   . Heart failure ([place])   . Hyperlipidemia   . Hypertension   . Hypokalemia   . Osteoarthritis     Medications:  (Not in a hospital admission)   Assessment: Pharmacy consulted to dose warfarin in this 86 year old female with Afib.  Pt was on warfarin 2.5 mg PO daily at home, last dose was on 1/01 @ 1700.  1/02 :  INR = 1.7  Goal of Therapy:  INR 2-3    Plan:  Will order warfarin 3.5 mg PO X 1 for 1/02 and recheck INR on 1/03 with AM labs.   Lacreshia Bondarenko D 07/14/2019,7:54 PM

## 2019-07-14 NOTE — ED Provider Notes (Signed)
[facility] Emergency Department Provider Note   ____________________________________________   First MD Initiated Contact with Patient 07/14/19 1122     (approximate)  I have reviewed the triage vital signs and the nursing notes.   HISTORY  Chief Complaint Shortness of Breath    HPI Judith Keller is a 86 y.o. female with a history of atrial fibrillation, CHF, COVID-19 over a month ago  Patient presents today for shortness of breath.  Became short of breath throughout the day.  She reports last night feeling short of breath.  She had increased feeling of shortness of breath and she reports it feels like the last time she came into the hospital.  Denies fevers or chills.  No cough.  No chest pain.  No nausea vomiting or diarrhea.  Currently residing in a nursing facility.  Reports shortness of breath, was worse but oxygen is helping some.   Past Medical History:  Diagnosis Date  . Atrial fibrillation ([place])   . Heart failure ([place])   . Hyperlipidemia   . Hypertension   . Hypokalemia   . Osteoarthritis     Patient Active Problem List   Diagnosis Date Noted  . HLD (hyperlipidemia) 07/14/2019  . Hypothermia 07/14/2019  . Leucocytosis 07/14/2019  . COVID-19 virus infection 07/14/2019  . Acute on chronic respiratory failure with hypoxia ([place]) 07/14/2019  . Pressure injury of skin 06/09/2019  . CHF (congestive heart failure) ([place]) 06/09/2019  . Acute on chronic combined systolic and diastolic CHF (congestive heart failure) ([place]) 06/08/2019  . Acute respiratory distress 06/08/2019  . Hypoxia 06/08/2019  . Elevated brain natriuretic peptide (BNP) level 06/08/2019  . Atrial fibrillation, chronic ([place]) 06/08/2019  . Polymyalgia rheumatica ([place]) 06/08/2019  . CAD (coronary artery disease) 06/08/2019  . S/P right hip fracture 11/15/2018    Past Surgical History:  Procedure Laterality Date  . FOOT SURGERY    . HIP ARTHROPLASTY Right 11/16/2018    Procedure: ARTHROPLASTY BIPOLAR HIP (HEMIARTHROPLASTY);  Surgeon: Lovell Sheehan, MD;  Location: [facility] ORS;  Service: Orthopedics;  Laterality: Right;  . MASTECTOMY      Prior to Admission medications   Medication Sig Start Date End Date Taking? Authorizing Provider  acetaminophen (TYLENOL) 325 MG tablet Take 650 mg by mouth every 6 (six) hours as needed for mild pain.    [provider]  albuterol (VENTOLIN HFA) 108 (90 Base) MCG/ACT inhaler Inhale 1 puff into the lungs every 4 (four) hours as needed for wheezing or shortness of breath.    [provider]  aspirin EC 81 MG tablet Take 1 tablet (81 mg total) by mouth daily. 11/18/18 11/18/19  Nicholes Mango, MD  cholecalciferol (VITAMIN D) 25 MCG (1000 UT) tablet Take 2,000 Units by mouth daily.    [provider]  citalopram (CELEXA) 10 MG tablet Take 10 mg by mouth daily.    [provider]  diltiazem (DILACOR XR) 120 MG 24 hr capsule Take 120 mg by mouth daily.    [provider]  fluticasone (FLONASE) 50 MCG/ACT nasal spray Place 2 sprays into both nostrils at bedtime.    [provider]  furosemide (LASIX) 20 MG tablet Take 1 tablet (20 mg total) by mouth daily. 06/12/19   Nolberto Hanlon, MD  Melatonin 3 MG TABS Take 3 mg by mouth at bedtime.    [provider]  metoprolol succinate (TOPROL-XL) 25 MG 24 hr tablet Take 50 mg by mouth daily. Take with or immediately following  a meal.     [provider]  Multiple Vitamin (MULTIVITAMIN WITH MINERALS) TABS tablet Take 1 tablet by mouth daily.    [provider]  polyethylene glycol (MIRALAX / GLYCOLAX) 17 g packet Take 17 g by mouth daily.    [provider]  predniSONE (DELTASONE) 5 MG tablet Take 5 mg by mouth daily with breakfast.    [provider]  simvastatin (ZOCOR) 20 MG tablet Take 20 mg by mouth at bedtime.     [provider]  traMADol (ULTRAM) 50 MG tablet Take 1 tablet (50 mg total) by  mouth every 6 (six) hours as needed for moderate pain. 11/18/18   Nicholes Mango, MD  warfarin (COUMADIN) 5 MG tablet Take 1 tablet (5 mg total) by mouth one time only at 6 PM for 4 days. Patient taking differently: Take 5 mg by mouth daily.  11/18/18 06/08/19  Nicholes Mango, MD    Allergies Patient has no known allergies.  No family history on file.  Social History Social History   Tobacco Use  . Smoking status: Never Smoker  . Smokeless tobacco: Never Used  Substance Use Topics  . Alcohol use: Not Currently  . Drug use: Never    Review of Systems Constitutional: No fever/chills Eyes: No visual changes. ENT: No sore throat. Cardiovascular: Denies chest pain. Respiratory: Short of breath Gastrointestinal: No abdominal pain.   Genitourinary: Not aware of having urinated recently Musculoskeletal: Negative for back pain. Neurological: Negative for headaches.    ____________________________________________   PHYSICAL EXAM:  VITAL SIGNS: ED Triage Vitals  Enc Vitals Group     BP 07/14/19 0933 (!) 157/83     Pulse Rate 07/14/19 0933 (!) 115     Resp 07/14/19 0933 (!) 32     Temp 07/14/19 0933 (!) 97.4 F (36.3 C)     Temp Source 07/14/19 0933 Oral     SpO2 07/14/19 0933 99 %     Weight 07/14/19 0936 140 lb (63.5 kg)     Height 07/14/19 0936 5\' 3"  (1.6 m)     Head Circumference --      Peak Flow --      Pain Score 07/14/19 0936 0     Pain Loc --      Pain Edu? --      Excl. in [place]? --     Constitutional: Alert and oriented.  Moderate distress, increased work of breathing accessory muscle use and tachypnea Eyes: Conjunctivae are normal. Head: Atraumatic. Nose: No congestion/rhinnorhea. Mouth/Throat: Mucous membranes are moist. Neck: No stridor.  Cardiovascular: Slightly tachycardic rate, regular rhythm. Grossly normal heart sounds.  Good peripheral circulation. Respiratory: Moderate increased work of breath, accessory muscle use, slight crackles in the bases, she has  moderate increased work of breathing.  Tolerating nonrebreather well. Gastrointestinal: Soft and nontender. No distention. Musculoskeletal: No lower extremity tenderness but does have notable bilateral lower extremity edema Neurologic:  Normal speech and language. No gross focal neurologic deficits are appreciated.  Skin:  Skin is warm, dry and intact. No rash noted. Psychiatric: Mood and affect are normal. Speech and behavior are normal.  ____________________________________________   LABS (all labs ordered are listed, but only abnormal results are displayed)  Labs Reviewed  BRAIN NATRIURETIC PEPTIDE - Abnormal; Notable for the following components:      Result Value   B Natriuretic Peptide 844.0 (*)    All other components within normal limits  CBC - Abnormal; Notable for the following components:  WBC 12.7 (*)    RBC 3.67 (*)    Hemoglobin 8.5 (*)    HCT 30.8 (*)    MCH 23.2 (*)    MCHC 27.6 (*)    RDW 16.7 (*)    Platelets 462 (*)    All other components within normal limits  BASIC METABOLIC PANEL - Abnormal; Notable for the following components:   Glucose, Bld 123 (*)    Calcium 8.7 (*)    All other components within normal limits  CULTURE, BLOOD (ROUTINE X 2)  CULTURE, BLOOD (ROUTINE X 2)  PROTIME-INR  TROPONIN I (HIGH SENSITIVITY)   ____________________________________________  EKG  Reviewed inter by me at 930 Heart rate 125 QRS 90 QTc 500 Atrial fibrillation, rapid ventricular response without evidence of acute ischemia, some baseline artifact ____________________________________________  RADIOLOGY  DG Chest Portable 1 View  Result Date: 07/14/2019 CLINICAL DATA:  Dyspnea. History of atrial fibrillation. EXAM: PORTABLE CHEST 1 VIEW COMPARISON:  06/08/2019 FINDINGS: Stable cardiac enlargement. In a bilateral pleural effusions are identified right greater than left. Atelectasis and/or airspace disease is noted within the right lower lobe. Mildly diminished  aeration to left base also noted likely due to subsegmental atelectasis. Scoliosis and degenerative disc disease noted within the thoracolumbar spine. IMPRESSION: 1. Bilateral pleural effusions, right greater than left. 2. Right lower lobe and left base atelectasis and/or airspace disease. Electronically Signed   By: Kerby Moors M.D.   On: 07/14/2019 10:12    Chest x-ray personally viewed by me, concerning for bilateral pleural effusions ____________________________________________   PROCEDURES  Procedure(s) performed: None  Procedures  Critical Care performed: Yes  CRITICAL CARE Performed by: Delman Kitten   Total critical care time: 30 minutes  Critical care time was exclusive of separately billable procedures and treating other patients.  Critical care was necessary to treat or prevent imminent or life-threatening deterioration.  Critical care was time spent personally by me on the following activities: development of treatment plan with patient and/or surrogate as well as nursing, discussions with consultants, evaluation of patient's response to treatment, examination of patient, obtaining history from patient or surrogate, ordering and performing treatments and interventions, ordering and review of laboratory studies, ordering and review of radiographic studies, pulse oximetry and re-evaluation of patient's condition.  ____________________________________________   INITIAL IMPRESSION / ASSESSMENT AND PLAN / ED COURSE  Pertinent labs & imaging results that were available during my care of the patient were reviewed by me and considered in my medical decision making (see chart for details).   Patient presents for dyspnea.  Afebrile, denies productive cough or obvious infectious symptoms.  Had COVID-19 over a month ago, she appears to be outside of this infectious window.  She does also have a history of congestive heart failure with a recent admission, and reports same symptoms  with that with shortness of breath.  Her chest x-ray today reviewed bilateral pleural effusions with associated hypoxia requiring oxygen administration via nonrebreather as nasal cannula was not providing adequate oxygenation with EMS  Clinical Course as of Jul 13 1204  Sat Jul 14, 2019  1118 Patient's work of breathing has improved.  She is resting more comfortably.  She is alert, definitely appears more comfortable.  Currently on nonrebreather, working to see if we can titrate her back to nasal cannula as she does show significant improvement in her work of breathing at this time   [MQ]    Clinical Course User Index [MQ] Delman Kitten, MD   ----------------------------------------- 12:05 PM  on 07/14/2019 -----------------------------------------  Labs reviewed, slight leukocytosis, chest x-ray and her clinical history to me most suggestive of acute CHF exacerbation.  After receiving IV Lasix the patient's work of breathing vital signs and oxygen requirement have all improved.  She is now on 4 L nasal cannula satting in the mid to high 90s.  She appears much more relaxed feeling improved.  Will admit to the hospitalist for further work-up and care, I cannot exclude pneumonia at this point, but I find based on the clinical history that CHF appears to be the most obvious and acute source.  Discussed with hospitalist will be admitting the patient  ____________________________________________   FINAL CLINICAL IMPRESSION(S) / ED DIAGNOSES  Final diagnoses:  Acute on chronic congestive heart failure, unspecified heart failure type [facility])        Note:  This document was prepared using Dragon voice recognition software and may include unintentional dictation errors       Delman Kitten, MD 07/14/19 1206

## 2019-07-14 NOTE — ED Notes (Signed)
Pt given dinner at this time

## 2019-07-14 NOTE — ED Notes (Signed)
Attempt to call report x2 rn not available.

## 2019-07-15 DIAGNOSIS — I5043 Acute on chronic combined systolic (congestive) and diastolic (congestive) heart failure: Secondary | ICD-10-CM

## 2019-07-15 LAB — CBC
HCT: 29.4 % — ABNORMAL LOW (ref 36.0–46.0)
Hemoglobin: 8.5 g/dL — ABNORMAL LOW (ref 12.0–15.0)
MCH: 22.7 pg — ABNORMAL LOW (ref 26.0–34.0)
MCHC: 28.9 g/dL — ABNORMAL LOW (ref 30.0–36.0)
MCV: 78.6 fL — ABNORMAL LOW (ref 80.0–100.0)
Platelets: 410 10*3/uL — ABNORMAL HIGH (ref 150–400)
RBC: 3.74 MIL/uL — ABNORMAL LOW (ref 3.87–5.11)
RDW: 16.9 % — ABNORMAL HIGH (ref 11.5–15.5)
WBC: 10.2 10*3/uL (ref 4.0–10.5)
nRBC: 0 % (ref 0.0–0.2)

## 2019-07-15 LAB — BASIC METABOLIC PANEL
Anion gap: 11 (ref 5–15)
BUN: 18 mg/dL (ref 8–23)
CO2: 29 mmol/L (ref 22–32)
Calcium: 8.5 mg/dL — ABNORMAL LOW (ref 8.9–10.3)
Chloride: 99 mmol/L (ref 98–111)
Creatinine, Ser: 0.55 mg/dL (ref 0.44–1.00)
GFR calc Af Amer: >60 mL/min (ref >60)
GFR calc non Af Amer: >60 mL/min (ref >60)
Glucose, Bld: 85 mg/dL (ref 70–99)
Potassium: 2.8 mmol/L — ABNORMAL LOW (ref 3.5–5.1)
Sodium: 139 mmol/L (ref 135–145)

## 2019-07-15 LAB — MAGNESIUM: Magnesium: 1.8 mg/dL (ref 1.7–2.4)

## 2019-07-15 LAB — PROTIME-INR
INR: 1.6 — ABNORMAL HIGH (ref 0.8–1.2)
Prothrombin Time: 18.7 seconds — ABNORMAL HIGH (ref 11.4–15.2)

## 2019-07-15 LAB — POTASSIUM: Potassium: 4.8 mmol/L (ref 3.5–5.1)

## 2019-07-15 MED ORDER — POTASSIUM CHLORIDE CRYS ER 20 MEQ PO TBCR
40.0000 meq | EXTENDED_RELEASE_TABLET | Freq: Once | ORAL | Status: AC
  Administered 2019-07-15: 10:00:00 40 meq via ORAL
  Filled 2019-07-15: qty 2

## 2019-07-15 MED ORDER — POTASSIUM CHLORIDE 10 MEQ/100ML IV SOLN
10.0000 meq | INTRAVENOUS | Status: AC
  Administered 2019-07-15 (×4): 10 meq via INTRAVENOUS
  Filled 2019-07-15 (×4): qty 100

## 2019-07-15 MED ORDER — WARFARIN SODIUM 4 MG PO TABS
4.0000 mg | ORAL_TABLET | Freq: Once | ORAL | Status: AC
  Administered 2019-07-15: 18:00:00 4 mg via ORAL
  Filled 2019-07-15: qty 1

## 2019-07-15 MED ORDER — MAGNESIUM SULFATE 2 GM/50ML IV SOLN
2.0000 g | Freq: Once | INTRAVENOUS | Status: AC
  Administered 2019-07-15: 10:00:00 2 g via INTRAVENOUS
  Filled 2019-07-15: qty 50

## 2019-07-15 NOTE — Progress Notes (Signed)
ANTICOAGULATION CONSULT NOTE - Initial Consult  Pharmacy Consult for Warfarin  Indication: atrial fibrillation  No Known Allergies  Patient Measurements: Height: 5\' 3"  (160 cm) Weight: 134 lb 7.7 oz (61 kg) IBW/kg (Calculated) : 52.4 Heparin Dosing Weight:   Vital Signs: Temp: 98.5 F (36.9 C) (01/03 0831) Temp Source: Oral (01/03 0831) BP: 108/55 (01/03 0831) Pulse Rate: 100 (01/03 0831)  Labs: Recent Labs    07/14/19 1011 07/14/19 1912 07/15/19 0428  HGB 8.5*  --  8.5*  HCT 30.8*  --  29.4*  PLT 462*  --  410*  LABPROT  --  19.4* 18.7*  INR  --  1.7* 1.6*  CREATININE 0.50  --  0.55  TROPONINIHS 12  --   --     Estimated Creatinine Clearance: 37.1 mL/min (by C-G formula based on SCr of 0.55 mg/dL).   Medical History: Past Medical History:  Diagnosis Date  . Atrial fibrillation ([place])   . Heart failure ([place])   . Hyperlipidemia   . Hypertension   . Hypokalemia   . Osteoarthritis     Medications:  Medications Prior to Admission  Medication Sig Dispense Refill Last Dose  . acetaminophen (TYLENOL) 325 MG tablet Take 650 mg by mouth every 6 (six) hours as needed for mild pain.   07/13/2019 at PRN  . albuterol (VENTOLIN HFA) 108 (90 Base) MCG/ACT inhaler Inhale 2 puffs into the lungs every 4 (four) hours as needed for wheezing or shortness of breath.    07/14/2019 at PRN  . aspirin EC 81 MG tablet Take 1 tablet (81 mg total) by mouth daily. 150 tablet 2 07/14/2019 at 0800  . cholecalciferol (VITAMIN D) 25 MCG (1000 UT) tablet Take 2,000 Units by mouth daily.   07/14/2019 at 0800  . citalopram (CELEXA) 10 MG tablet Take 10 mg by mouth daily.   07/14/2019 at 0800  . diltiazem (DILACOR XR) 120 MG 24 hr capsule Take 120 mg by mouth daily.   07/14/2019 at 0800  . fluticasone (FLONASE) 50 MCG/ACT nasal spray Place 2 sprays into both nostrils at bedtime.   07/13/2019 at 1945  . furosemide (LASIX) 20 MG tablet Take 1 tablet (20 mg total) by mouth daily. 30 tablet 0 07/14/2019 at 0800  .  Melatonin 3 MG TABS Take 3 mg by mouth at bedtime.   07/13/2019 at [place]  . metoprolol succinate (TOPROL-XL) 50 MG 24 hr tablet Take 50 mg by mouth daily. Take with or immediately following a meal.    07/14/2019 at 0800  . Multiple Vitamin (MULTIVITAMIN WITH MINERALS) TABS tablet Take 1 tablet by mouth daily.   07/14/2019 at 0800  . polyethylene glycol (MIRALAX / GLYCOLAX) 17 g packet Take 17 g by mouth daily.   07/14/2019 at 0800  . predniSONE (DELTASONE) 5 MG tablet Take 5 mg by mouth daily with breakfast.   07/14/2019 at 0800  . simvastatin (ZOCOR) 20 MG tablet Take 20 mg by mouth at bedtime.    07/13/2019 at [place]  . traMADol (ULTRAM) 50 MG tablet Take 1 tablet (50 mg total) by mouth every 6 (six) hours as needed for moderate pain. 20 tablet 0 7+ days at PRN  . vitamin C (ASCORBIC ACID) 250 MG tablet Take 500 mg by mouth daily.   07/14/2019 at 0800  . warfarin (COUMADIN) 2.5 MG tablet Take 2.5 mg by mouth every evening.   07/13/2019 at 1715    Assessment: Pharmacy consulted to dose warfarin in this 86 year old female  with Afib.  Pt was on warfarin 2.5 mg PO daily at home, last dose was on 1/01 @ 1700.   Date INR Warfarin Dose  1/2 1.7 3.5 mg  1/3 1.6 4 mg     Goal of Therapy:  INR 2-3    Plan:  INR subtherapeutic. Will order warfarin 4 mg PO X 1. and recheck INR on 1/03 with AM labs.   Oswald Hillock, PharmD, BCPS 07/15/2019,8:43 AM

## 2019-07-15 NOTE — Progress Notes (Signed)
End of Shift Summary:  Date: 07/15/2019 Shift: 0700-1900 Significant Events: Potassium replaced via 61mEq PO and 4x IVPB 67mEq for 44mEq total. K+ from 2.8 to 4.8 this shift. 50mg  IV mag given. Purewick in place. Patient resting comfortably in bed today. O2 weaned to 2L.

## 2019-07-15 NOTE — Progress Notes (Signed)
Brief Hx:  Judith Keller is a 86 y.o. female with medical history significant of CHF with EF 40-45%, hypertension, hyperlipidemia, atrial fibrillation on Coumadin, PMR on prednisone, CAD, COVID-19 infection in October (still PCR positive on 06/09/2019), who presents with shortness breath. Pt was COVID positive in October and still PCR positive on 06/09/19. She has been on oxygen chronically ever since. She developed worsening SOB since yesterday. No cough, fever or chills. No CP. No nausea, vomiting, diarrhea or abdominal pain. EMS report pt was on 8L upon arrival at 88%.   Subjective: Still requiring full liter of supplemental oxygen via nasal cannula.  Denies any fever.  Objective: Vital signs in last 24 hours: Temp:  [98.3 F (36.8 C)-98.7 F (37.1 C)] 98.5 F (36.9 C) (01/03 0831) Pulse Rate:  [93-117] 100 (01/03 0831) Resp:  [18-27] 18 (01/03 0831) BP: (107-141)/(55-85) 108/55 (01/03 0831) SpO2:  [91 %-100 %] 94 % (01/03 0831) Weight:  [61 kg-61.2 kg] 61 kg (01/03 0336)  Intake/Output from previous day: 01/02 0701 - 01/03 0700 In: -  Out: 375 [Urine:375] Intake/Output this shift: Total I/O In: 183.2 [I.V.:3; IV Piggyback:180.2] Out: -   General: Not in acute distress HEENT:       Eyes: PERRL, EOMI, no scleral icterus.       ENT: No discharge from the ears and nose, no pharynx injection, no tonsillar enlargement.        Neck: No JVD, no bruit, no mass felt. Heme: No neck lymph node enlargement. Cardiac: S1/S2, RRR, No murmurs, No gallops or rubs. Respiratory: has fine crackles bilaterally. GI: Soft, nondistended, nontender, no rebound pain, no organomegaly, BS present. GU: No hematuria Ext: has mild leg edema bilaterally. 2+DP/PT pulse bilaterally. Musculoskeletal: No joint deformities, No joint redness or warmth, no limitation of ROM in spin. Skin: No rashes.  Has bruise in the left lower leg Neuro: Alert, cranial nerves II-XII grossly intact, moves all extremities  normally.  Psych: Patient is not psychotic, no suicidal or hemocidal ideation.  Results for orders placed or performed during the hospital encounter of 07/14/19 (from the past 24 hour(s))  CULTURE, BLOOD (ROUTINE X 2) w Reflex to ID Panel     Status: None (Preliminary result)   Collection Time: 07/14/19  7:12 PM   Specimen: BLOOD  Result Value Ref Range   Specimen Description BLOOD RIGHT ANTECUBITAL    Special Requests      BOTTLES DRAWN AEROBIC AND ANAEROBIC Blood Culture adequate volume   Culture      NO GROWTH < 12 HOURS Performed at [facility], [address] [place], [place] [address]    Report Status PENDING   CULTURE, BLOOD (ROUTINE X 2) w Reflex to ID Panel     Status: None (Preliminary result)   Collection Time: 07/14/19  7:12 PM   Specimen: BLOOD  Result Value Ref Range   Specimen Description BLOOD LEFT ANTECUBITAL    Special Requests      BOTTLES DRAWN AEROBIC AND ANAEROBIC Blood Culture adequate volume   Culture      NO GROWTH < 12 HOURS Performed at [facility], [address] [place], [place] [address]    Report Status PENDING   Today - PT/INR     Status: Abnormal   Collection Time: 07/14/19  7:12 PM  Result Value Ref Range   Prothrombin Time 19.4 (H) 11.4 - 15.2 seconds   INR 1.7 (H) 0.8 - 1.2  Basic metabolic panel  Status: Abnormal   Collection Time: 07/15/19  4:28 AM  Result Value Ref Range   Sodium 139 135 - 145 mmol/L   Potassium 2.8 (L) 3.5 - 5.1 mmol/L   Chloride 99 98 - 111 mmol/L   CO2 29 22 - 32 mmol/L   Glucose, Bld 85 70 - 99 mg/dL   BUN 18 8 - 23 mg/dL   Creatinine, Ser 0.55 0.44 - 1.00 mg/dL   Calcium 8.5 (L) 8.9 - 10.3 mg/dL   GFR calc non Af Amer >60 >60 mL/min   GFR calc Af Amer >60 >60 mL/min   Anion gap 11 5 - 15  Protime-INR     Status: Abnormal   Collection Time: 07/15/19  4:28 AM  Result Value Ref Range   Prothrombin Time 18.7 (H) 11.4 - 15.2 seconds   INR 1.6 (H) 0.8 - 1.2  AM - CBC     Status:  Abnormal   Collection Time: 07/15/19  4:28 AM  Result Value Ref Range   WBC 10.2 4.0 - 10.5 K/uL   RBC 3.74 (L) 3.87 - 5.11 MIL/uL   Hemoglobin 8.5 (L) 12.0 - 15.0 g/dL   HCT 29.4 (L) 36.0 - 46.0 %   MCV 78.6 (L) 80.0 - 100.0 fL   MCH 22.7 (L) 26.0 - 34.0 pg   MCHC 28.9 (L) 30.0 - 36.0 g/dL   RDW 16.9 (H) 11.5 - 15.5 %   Platelets 410 (H) 150 - 400 K/uL   nRBC 0.0 0.0 - 0.2 %  AM - Mg     Status: None   Collection Time: 07/15/19  4:28 AM  Result Value Ref Range   Magnesium 1.8 1.7 - 2.4 mg/dL    Studies/Results: DG Chest Portable 1 View  Result Date: 07/14/2019 CLINICAL DATA:  Dyspnea. History of atrial fibrillation. EXAM: PORTABLE CHEST 1 VIEW COMPARISON:  06/08/2019 FINDINGS: Stable cardiac enlargement. In a bilateral pleural effusions are identified right greater than left. Atelectasis and/or airspace disease is noted within the right lower lobe. Mildly diminished aeration to left base also noted likely due to subsegmental atelectasis. Scoliosis and degenerative disc disease noted within the thoracolumbar spine. IMPRESSION: 1. Bilateral pleural effusions, right greater than left. 2. Right lower lobe and left base atelectasis and/or airspace disease. Electronically Signed   By: Kerby Moors M.D.   On: 07/14/2019 10:12    Scheduled Meds: . vitamin C  500 mg Oral Daily  . aspirin EC  81 mg Oral Daily  . cholecalciferol  2,000 Units Oral Daily  . citalopram  10 mg Oral Daily  . dextromethorphan-guaiFENesin  1 tablet Oral BID  . diltiazem  120 mg Oral Daily  . fluticasone  2 spray Each Nare QHS  . furosemide  20 mg Intravenous Q12H  . ipratropium  2 puff Inhalation Q4H  . lisinopril  2.5 mg Oral Daily  . Melatonin  5 mg Oral QHS  . metoprolol succinate  50 mg Oral Daily  . multivitamin with minerals  1 tablet Oral Daily  . predniSONE  15 mg Oral Q breakfast  . simvastatin  20 mg Oral QHS  . sodium chloride flush  3 mL Intravenous Q12H  . warfarin  4 mg Oral ONCE-1800  .  Warfarin - Pharmacist Dosing Inpatient   Does not apply q1800   Continuous Infusions: . sodium chloride     PRN Meds:sodium chloride, acetaminophen, albuterol, hydrALAZINE, ondansetron (ZOFRAN) IV, polyethylene glycol, sodium chloride flush, traMADol  Assessment/Plan:  Acute on chronic respiratory  failure with hypoxia and acute on chronic combined systolic and diastolic CHF (congestive heart failure) ([place]): Improving slowly  - her SOB is most likely due to CHF exacerbation given elevated BNP of 844.   - Patient had COVID-19 infection in October, still had positive PCR on 11/28, but patient does not have fever, less likely to be still contributed to current issue.   -Lasix 20 mg bid by IV (pt received 60 mg of IV lasix in ED) - Last Echo on  06/08/19, which showed EF of 40-45% with grade 1 diastolic dysfunction -Daily weights: lost 2 kg so far  -strict I/O's -Low salt diet -Fluid restriction  Atrial fibrillation, chronic ([place]): HR 100-110s -continue metoprolol and cardizem -Coumadin per pharm  Polymyalgia rheumatica ([place]): - Cont 15 mg daily for now  - S/P 1 dose of Solu-Cortef 50 mg as stress dose on admission   CAD (coronary artery disease): -on ASA and zocor  HLD (hyperlipidemia): -zocor  Hypothermia:  Stable now  -Bair Hugger PRN   Leucocytosis: Resolved now  WBC 12.7 on admission - . No fever.  Possibly due to steroid use -Follow-up of blood culture -- so far no growth   COVID-19 virus infection: Pt was COVID positive in October and still PCR positive on 06/09/19. No fever. -observe now - On precaution from admission    LOS: 1 day   Sherwood Castilla Izetta Dakin

## 2019-07-16 LAB — PROTIME-INR
INR: 1.7 — ABNORMAL HIGH (ref 0.8–1.2)
Prothrombin Time: 19.8 seconds — ABNORMAL HIGH (ref 11.4–15.2)

## 2019-07-16 LAB — BASIC METABOLIC PANEL
Anion gap: 10 (ref 5–15)
BUN: 22 mg/dL (ref 8–23)
CO2: 31 mmol/L (ref 22–32)
Calcium: 8.6 mg/dL — ABNORMAL LOW (ref 8.9–10.3)
Chloride: 100 mmol/L (ref 98–111)
Creatinine, Ser: 0.66 mg/dL (ref 0.44–1.00)
GFR calc Af Amer: >60 mL/min (ref >60)
GFR calc non Af Amer: >60 mL/min (ref >60)
Glucose, Bld: 88 mg/dL (ref 70–99)
Potassium: 3.3 mmol/L — ABNORMAL LOW (ref 3.5–5.1)
Sodium: 141 mmol/L (ref 135–145)

## 2019-07-16 MED ORDER — POTASSIUM CHLORIDE CRYS ER 10 MEQ PO TBCR
30.0000 meq | EXTENDED_RELEASE_TABLET | Freq: Two times a day (BID) | ORAL | Status: DC
  Administered 2019-07-16 – 2019-07-17 (×3): 30 meq via ORAL
  Filled 2019-07-16 (×3): qty 3

## 2019-07-16 MED ORDER — WARFARIN SODIUM 3 MG PO TABS
3.0000 mg | ORAL_TABLET | Freq: Once | ORAL | Status: AC
  Administered 2019-07-16: 16:00:00 3 mg via ORAL
  Filled 2019-07-16: qty 1

## 2019-07-16 NOTE — NC FL2 (Signed)
[place] LEVEL OF CARE SCREENING TOOL     IDENTIFICATION  Patient Name: Judith Keller Birthdate: 20-Nov-1924 Sex: female Admission Date (Current Location): 07/14/2019  [place] and [place] Number:  Engineering geologist and Address:  [facility], [address], [place], [place] [address]      Provider Number: [id number]  Attending Physician Name and Address:  Thornell Mule, MD  Relative Name and Phone Number:       Current Level of Care: Hospital Recommended Level of Care: [place] Prior Approval Number:    Date Approved/Denied:   PASRR Number: [id number]  Discharge Plan: SNF    Current Diagnoses: Patient Active Problem List   Diagnosis Date Noted  . HLD (hyperlipidemia) 07/14/2019  . Hypothermia 07/14/2019  . Leucocytosis 07/14/2019  . COVID-19 virus infection 07/14/2019  . Acute on chronic respiratory failure with hypoxia ([place]) 07/14/2019  . Pressure injury of skin 06/09/2019  . CHF (congestive heart failure) ([place]) 06/09/2019  . Acute on chronic combined systolic and diastolic CHF (congestive heart failure) ([place]) 06/08/2019  . Acute respiratory distress 06/08/2019  . Hypoxia 06/08/2019  . Elevated brain natriuretic peptide (BNP) level 06/08/2019  . Atrial fibrillation, chronic ([place]) 06/08/2019  . Polymyalgia rheumatica ([place]) 06/08/2019  . CAD (coronary artery disease) 06/08/2019  . S/P right hip fracture 11/15/2018    Orientation RESPIRATION BLADDER Height & Weight     Self, Time, Situation, Place  O2(Nasal Canula 2 L) Incontinent, External catheter Weight: 134 lb 12.8 oz (61.1 kg) Height:  5\' 3"  (160 cm)  BEHAVIORAL SYMPTOMS/MOOD NEUROLOGICAL BOWEL NUTRITION STATUS  (None) (None) Continent Diet(Heart healthy)  AMBULATORY STATUS COMMUNICATION OF NEEDS Skin     Verbally Skin abrasions, Bruising, Other (Comment)(Skin tear.)                       Personal Care Assistance Level of Assistance               Functional Limitations Info  Sight, Hearing, Speech Sight Info: Adequate Hearing Info: Adequate Speech Info: Adequate    SPECIAL CARE FACTORS FREQUENCY                       Contractures Contractures Info: Not present    Additional Factors Info  Code Status, Allergies, Isolation Precautions Code Status Info: DNR Allergies Info: NKDA     Isolation Precautions Info: Airborne/Contact: COVID in October     Current Medications (07/16/2019):  This is the current hospital active medication list Current Facility-Administered Medications  Medication Dose Route Frequency Provider Last Rate Last Admin  . 0.9 %  sodium chloride infusion  250 mL Intravenous PRN Ivor Costa, MD      . acetaminophen (TYLENOL) tablet 650 mg  650 mg Oral Q6H PRN Ivor Costa, MD   650 mg at 07/15/19 1117  . albuterol (VENTOLIN HFA) 108 (90 Base) MCG/ACT inhaler 2 puff  2 puff Inhalation Q4H PRN Ivor Costa, MD   2 puff at 07/15/19 2157  . ascorbic acid (VITAMIN C) tablet 500 mg  500 mg Oral Daily Ivor Costa, MD   500 mg at 07/16/19 0951  . aspirin EC tablet 81 mg  81 mg Oral Daily Ivor Costa, MD   81 mg at 07/16/19 0953  . cholecalciferol (VITAMIN D3) tablet 2,000 Units  2,000 Units Oral Daily Ivor Costa, MD   2,000 Units at 07/16/19 [phone]  . citalopram (CELEXA) tablet 10 mg  10 mg Oral Daily Ivor Costa, MD   10 mg at 07/16/19 0953  . dextromethorphan-guaiFENesin (MUCINEX DM) 30-600 MG per 12 hr tablet 1 tablet  1 tablet Oral BID Ivor Costa, MD   1 tablet at 07/15/19 2203  . diltiazem (CARDIZEM CD) 24 hr capsule 120 mg  120 mg Oral Daily Ivor Costa, MD   120 mg at 07/16/19 0953  . fluticasone (FLONASE) 50 MCG/ACT nasal spray 2 spray  2 spray Each Nare QHS Ivor Costa, MD   2 spray at 07/15/19 2157  . furosemide (LASIX) injection 20 mg  20 mg Intravenous Q12H Ivor Costa, MD   20 mg at 07/16/19 0954  . hydrALAZINE (APRESOLINE) tablet 25 mg  25 mg Oral TID PRN Ivor Costa, MD      . ipratropium (ATROVENT  HFA) inhaler 2 puff  2 puff Inhalation Q4H Ivor Costa, MD   2 puff at 07/16/19 1320  . lisinopril (ZESTRIL) tablet 2.5 mg  2.5 mg Oral Daily Ivor Costa, MD   2.5 mg at 07/16/19 0951  . Melatonin TABS 5 mg  5 mg Oral QHS Ivor Costa, MD   5 mg at 07/15/19 2203  . metoprolol succinate (TOPROL-XL) 24 hr tablet 50 mg  50 mg Oral Daily Ivor Costa, MD   50 mg at 07/16/19 0951  . multivitamin with minerals tablet 1 tablet  1 tablet Oral Daily Ivor Costa, MD   1 tablet at 07/16/19 [phone]  . ondansetron (ZOFRAN) injection 4 mg  4 mg Intravenous Q8H PRN Ivor Costa, MD      . polyethylene glycol (MIRALAX / GLYCOLAX) packet 17 g  17 g Oral Daily PRN Ivor Costa, MD      . potassium chloride (KLOR-CON) CR tablet 30 mEq  30 mEq Oral BID Thornell Mule, MD   30 mEq at 07/16/19 0952  . predniSONE (DELTASONE) tablet 15 mg  15 mg Oral Q breakfast Ivor Costa, MD   15 mg at 07/16/19 0957  . simvastatin (ZOCOR) tablet 20 mg  20 mg Oral QHS Ivor Costa, MD   20 mg at 07/15/19 2203  . sodium chloride flush (NS) 0.9 % injection 3 mL  3 mL Intravenous Q12H Ivor Costa, MD   3 mL at 07/16/19 0953  . sodium chloride flush (NS) 0.9 % injection 3 mL  3 mL Intravenous PRN Ivor Costa, MD      . traMADol Veatrice Bourbon) tablet 50 mg  50 mg Oral Q12H PRN Ivor Costa, MD      . warfarin (COUMADIN) tablet 3 mg  3 mg Oral ONCE-1800 Oswald Hillock, [place]      . Warfarin - Pharmacist Dosing Inpatient   Does not apply [id number] Ivor Costa, MD         Discharge Medications: Please see discharge summary for a list of discharge medications.  Relevant Imaging Results:  Relevant Lab Results:   Additional Information SS#: [id number]  Candie Chroman, LCSW

## 2019-07-16 NOTE — Clinical Social Work Note (Signed)
Patient is a long-term resident at [facility]. Patient was COVID positive in October and was still showing positive a month ago. CSW left voicemail for SNF admissions coordinator to confirm patient can return at discharge.  Dayton Scrape, [place]

## 2019-07-16 NOTE — TOC Initial Note (Addendum)
Transition of Care [facility]) - Initial/Assessment Note    Patient Details  Name: Judith Keller MRN: [id number] Date of Birth: [birth year]  [facility] CM/SW Contact:    Candie Chroman, LCSW Phone Number: 07/16/2019, 2:22 PM  Clinical Narrative:  Patient under airborne/contact precautions. She had COVID in October. No test this admission. CSW called patient's son, introduced role, and explained that discharge planning would be discussed. Patient's son confirmed she was admitted from [facility] and plan is for her to return at discharge. Patient has been there a little over a year and prefers being there to returning home. SNF admissions coordinator, Tammy, is aware of isolation precautions and confirmed they can still take her back at discharge. No further concerns. CSW encouraged patient's son to contact CSW as needed. CSW will continue to follow patient and her son for support and facilitate return to SNF once medically stable.    2:38 pm: Per RN, patient may be stable for discharge tomorrow. SNF will need a COVID test done and will go ahead and notify DON that it could still be positive from October. Sent MD a message requesting COVID test if it does look like discharge for tomorrow.             Expected Discharge Plan: Skilled Nursing Facility Barriers to Discharge: Continued Medical Work up   Patient Goals and CMS Choice     Choice offered to / list presented to : NA  Expected Discharge Plan and Services Expected Discharge Plan: [place] Choice: [place] Living arrangements for the past 2 months: Single Family Home                                      Prior Living Arrangements/Services Living arrangements for the past 2 months: Single Family Home Lives with:: Facility Resident Patient language and need for interpreter reviewed:: Yes Do you feel safe going back to the place where you live?: Yes       Need for Family Participation in Patient Care: Yes (Comment) Care giver support system in place?: Yes (comment)   Criminal Activity/Legal Involvement Pertinent to Current Situation/Hospitalization: No - Comment as needed  Activities of Daily Living Home Assistive Devices/Equipment: Wheelchair, Eyeglasses ADL Screening (condition at time of admission) Patient's cognitive ability adequate to safely complete daily activities?: Yes Is the patient deaf or have difficulty hearing?: Yes Does the patient have difficulty seeing, even when wearing glasses/contacts?: No Does the patient have difficulty concentrating, remembering, or making decisions?: Yes Patient able to express need for assistance with ADLs?: Yes Does the patient have difficulty dressing or bathing?: No Independently performs ADLs?: Yes (appropriate for developmental age) Does the patient have difficulty walking or climbing stairs?: Yes Weakness of Legs: Both Weakness of Arms/Hands: None  Permission Sought/Granted Permission sought to share information with : Facility Sport and exercise psychologist, Family Supports    Share Information with NAME: Judith Keller  Permission granted to share info w AGENCY: [facility]  Permission granted to share info w Relationship: Son  Permission granted to share info w Contact Information: [phone]  Emotional Assessment Appearance:: Appears stated age Attitude/Demeanor/Rapport: Unable to Assess Affect (typically observed): Unable to Assess Orientation: : Oriented to Self, Oriented to Place, Oriented to  Time, Oriented to Situation Alcohol / Substance Use: Not Applicable Psych Involvement: No (comment)  Admission  diagnosis:  Acute on chronic combined systolic and diastolic CHF (congestive heart failure) (HCC) [I50.43] Acute on chronic congestive heart failure, unspecified heart failure type [facility]) [I50.9] Patient Active Problem List   Diagnosis Date Noted  . HLD (hyperlipidemia)  07/14/2019  . Hypothermia 07/14/2019  . Leucocytosis 07/14/2019  . COVID-19 virus infection 07/14/2019  . Acute on chronic respiratory failure with hypoxia ([place]) 07/14/2019  . Pressure injury of skin 06/09/2019  . CHF (congestive heart failure) ([place]) 06/09/2019  . Acute on chronic combined systolic and diastolic CHF (congestive heart failure) ([place]) 06/08/2019  . Acute respiratory distress 06/08/2019  . Hypoxia 06/08/2019  . Elevated brain natriuretic peptide (BNP) level 06/08/2019  . Atrial fibrillation, chronic ([place]) 06/08/2019  . Polymyalgia rheumatica ([place]) 06/08/2019  . CAD (coronary artery disease) 06/08/2019  . S/P right hip fracture 11/15/2018   PCP:  Harlow Ohms, MD Pharmacy:  No Pharmacies Listed    Social Determinants of Health (SDOH) Interventions    Readmission Risk Interventions Readmission Risk Prevention Plan 11/18/2018 11/17/2018  Transportation Screening Complete Complete  PCP or Specialist Appt within 3-5 Days Complete -  HRI or [place] Not Complete Complete  HRI or Home Care Consult comments Patient returning to SNF. -  Social Work Consult for [place] Planning/Counseling Complete Complete  Palliative Care Screening Not Applicable -  Medication Review Press photographer) Complete Complete  Some recent data might be hidden

## 2019-07-16 NOTE — Progress Notes (Signed)
ANTICOAGULATION CONSULT NOTE - Initial Consult  Pharmacy Consult for Warfarin  Indication: atrial fibrillation  No Known Allergies  Patient Measurements: Height: 5\' 3"  (160 cm) Weight: 134 lb 12.8 oz (61.1 kg) IBW/kg (Calculated) : 52.4  Vital Signs: Temp: 97.8 F (36.6 C) (01/04 0732) Temp Source: Oral (01/04 0732) BP: 132/78 (01/04 0732) Pulse Rate: 88 (01/04 0732)  Labs: Recent Labs    07/14/19 1011 07/14/19 1912 07/15/19 0428 07/16/19 0604 07/16/19 0901  HGB 8.5*  --  8.5*  --   --   HCT 30.8*  --  29.4*  --   --   PLT 462*  --  410*  --   --   LABPROT  --  19.4* 18.7*  --  19.8*  INR  --  1.7* 1.6*  --  1.7*  CREATININE 0.50  --  0.55 0.66  --   TROPONINIHS 12  --   --   --   --     Estimated Creatinine Clearance: 37.1 mL/min (by C-G formula based on SCr of 0.66 mg/dL).   Medical History: Past Medical History:  Diagnosis Date  . Atrial fibrillation ([place])   . Heart failure ([place])   . Hyperlipidemia   . Hypertension   . Hypokalemia   . Osteoarthritis     Medications:  Medications Prior to Admission  Medication Sig Dispense Refill Last Dose  . acetaminophen (TYLENOL) 325 MG tablet Take 650 mg by mouth every 6 (six) hours as needed for mild pain.   07/13/2019 at PRN  . albuterol (VENTOLIN HFA) 108 (90 Base) MCG/ACT inhaler Inhale 2 puffs into the lungs every 4 (four) hours as needed for wheezing or shortness of breath.    07/14/2019 at PRN  . aspirin EC 81 MG tablet Take 1 tablet (81 mg total) by mouth daily. 150 tablet 2 07/14/2019 at 0800  . cholecalciferol (VITAMIN D) 25 MCG (1000 UT) tablet Take 2,000 Units by mouth daily.   07/14/2019 at 0800  . citalopram (CELEXA) 10 MG tablet Take 10 mg by mouth daily.   07/14/2019 at 0800  . diltiazem (DILACOR XR) 120 MG 24 hr capsule Take 120 mg by mouth daily.   07/14/2019 at 0800  . fluticasone (FLONASE) 50 MCG/ACT nasal spray Place 2 sprays into both nostrils at bedtime.   07/13/2019 at 1945  . furosemide (LASIX) 20 MG tablet  Take 1 tablet (20 mg total) by mouth daily. 30 tablet 0 07/14/2019 at 0800  . Melatonin 3 MG TABS Take 3 mg by mouth at bedtime.   07/13/2019 at [place]  . metoprolol succinate (TOPROL-XL) 50 MG 24 hr tablet Take 50 mg by mouth daily. Take with or immediately following a meal.    07/14/2019 at 0800  . Multiple Vitamin (MULTIVITAMIN WITH MINERALS) TABS tablet Take 1 tablet by mouth daily.   07/14/2019 at 0800  . polyethylene glycol (MIRALAX / GLYCOLAX) 17 g packet Take 17 g by mouth daily.   07/14/2019 at 0800  . predniSONE (DELTASONE) 5 MG tablet Take 5 mg by mouth daily with breakfast.   07/14/2019 at 0800  . simvastatin (ZOCOR) 20 MG tablet Take 20 mg by mouth at bedtime.    07/13/2019 at [place]  . traMADol (ULTRAM) 50 MG tablet Take 1 tablet (50 mg total) by mouth every 6 (six) hours as needed for moderate pain. 20 tablet 0 7+ days at PRN  . vitamin C (ASCORBIC ACID) 250 MG tablet Take 500 mg by mouth daily.   07/14/2019  at 0800  . warfarin (COUMADIN) 2.5 MG tablet Take 2.5 mg by mouth every evening.   07/13/2019 at 1715    Assessment: Pharmacy consulted to dose warfarin in this 86 year old female with Afib.  Pt was on warfarin 2.5 mg PO daily at home, last dose was on 1/01 @ 1700.   Date INR Warfarin Dose  1/2 1.7 3.5 mg  1/3 1.6 4 mg  1/4 1.7 3 mg     Goal of Therapy:  INR 2-3    Plan:  INR subtherapeutic. Will order warfarin 3 mg PO X 1 to help get INR therapeutic. Once therapeutic can reduce dose to original home dose and recheck INR with AM labs.   Oswald Hillock, PharmD, BCPS 07/16/2019,9:57 AM

## 2019-07-16 NOTE — Progress Notes (Signed)
Brief Hx:  Judith Keller is a 86 y.o. female with medical history significant of CHF with EF 40-45%, hypertension, hyperlipidemia, atrial fibrillation on Coumadin, PMR on prednisone, CAD, COVID-19 infection in October (still PCR positive on 06/09/2019), who presents with shortness breath. Pt was COVID positive in October and still PCR positive on 06/09/19. She has been on oxygen chronically ever since. She developed worsening SOB since yesterday. No cough, fever or chills. No CP. No nausea, vomiting, diarrhea or abdominal pain. EMS report pt was on 8L upon arrival at 88%.   Subjective: Still requiring full liter of supplemental oxygen via nasal cannula.  Denies any fever.  Objective: Vital signs in last 24 hours: Temp:  [97.8 F (36.6 C)-98.7 F (37.1 C)] 97.8 F (36.6 C) (01/04 0732) Pulse Rate:  [75-101] 88 (01/04 0732) Resp:  [18-20] 20 (01/04 0732) BP: (101-132)/(61-78) 132/78 (01/04 0732) SpO2:  [91 %-97 %] 97 % (01/04 0732) Weight:  [61.1 kg] 61.1 kg (01/04 0428)  Intake/Output from previous day: 01/03 0701 - 01/04 0700 In: 183.2 [I.V.:3] Out: 1500 [Urine:1500] Intake/Output this shift: Total I/O In: -  Out: 550 [Urine:550]  General: Not in acute distress HEENT:       Eyes: PERRL, EOMI, no scleral icterus.       ENT: No discharge from the ears and nose, no pharynx injection, no tonsillar enlargement.        Neck: No JVD, no bruit, no mass felt. Heme: No neck lymph node enlargement. Cardiac: S1/S2, RRR, No murmurs, No gallops or rubs. Respiratory: has fine crackles bilaterally. GI: Soft, nondistended, nontender, no rebound pain, no organomegaly, BS present. GU: No hematuria Ext: has mild leg edema bilaterally. 2+DP/PT pulse bilaterally. Musculoskeletal: No joint deformities, No joint redness or warmth, no limitation of ROM in spin. Skin: No rashes.  Has bruise in the left lower leg Neuro: Alert, cranial nerves II-XII grossly intact, moves all extremities normally.   Psych: Patient is not psychotic, no suicidal or hemocidal ideation.  Results for orders placed or performed during the hospital encounter of 07/14/19 (from the past 24 hour(s))  Potassium     Status: None   Collection Time: 07/15/19  4:51 PM  Result Value Ref Range   Potassium 4.8 3.5 - 5.1 mmol/L  Basic metabolic panel     Status: Abnormal   Collection Time: 07/16/19  6:04 AM  Result Value Ref Range   Sodium 141 135 - 145 mmol/L   Potassium 3.3 (L) 3.5 - 5.1 mmol/L   Chloride 100 98 - 111 mmol/L   CO2 31 22 - 32 mmol/L   Glucose, Bld 88 70 - 99 mg/dL   BUN 22 8 - 23 mg/dL   Creatinine, Ser 0.66 0.44 - 1.00 mg/dL   Calcium 8.6 (L) 8.9 - 10.3 mg/dL   GFR calc non Af Amer >60 >60 mL/min   GFR calc Af Amer >60 >60 mL/min   Anion gap 10 5 - 15  Protime-INR     Status: Abnormal   Collection Time: 07/16/19  9:01 AM  Result Value Ref Range   Prothrombin Time 19.8 (H) 11.4 - 15.2 seconds   INR 1.7 (H) 0.8 - 1.2    Studies/Results: DG Chest Portable 1 View  Result Date: 07/14/2019 CLINICAL DATA:  Dyspnea. History of atrial fibrillation. EXAM: PORTABLE CHEST 1 VIEW COMPARISON:  06/08/2019 FINDINGS: Stable cardiac enlargement. In a bilateral pleural effusions are identified right greater than left. Atelectasis and/or airspace disease is noted within the right lower  lobe. Mildly diminished aeration to left base also noted likely due to subsegmental atelectasis. Scoliosis and degenerative disc disease noted within the thoracolumbar spine. IMPRESSION: 1. Bilateral pleural effusions, right greater than left. 2. Right lower lobe and left base atelectasis and/or airspace disease. Electronically Signed   By: Kerby Moors M.D.   On: 07/14/2019 10:12    Scheduled Meds: . vitamin C  500 mg Oral Daily  . aspirin EC  81 mg Oral Daily  . cholecalciferol  2,000 Units Oral Daily  . citalopram  10 mg Oral Daily  . dextromethorphan-guaiFENesin  1 tablet Oral BID  . diltiazem  120 mg Oral Daily  .  fluticasone  2 spray Each Nare QHS  . furosemide  20 mg Intravenous Q12H  . ipratropium  2 puff Inhalation Q4H  . lisinopril  2.5 mg Oral Daily  . Melatonin  5 mg Oral QHS  . metoprolol succinate  50 mg Oral Daily  . multivitamin with minerals  1 tablet Oral Daily  . potassium chloride  30 mEq Oral BID  . predniSONE  15 mg Oral Q breakfast  . simvastatin  20 mg Oral QHS  . sodium chloride flush  3 mL Intravenous Q12H  . warfarin  3 mg Oral ONCE-1800  . Warfarin - Pharmacist Dosing Inpatient   Does not apply q1800   Continuous Infusions: . sodium chloride     PRN Meds:sodium chloride, acetaminophen, albuterol, hydrALAZINE, ondansetron (ZOFRAN) IV, polyethylene glycol, sodium chloride flush, traMADol  Assessment/Plan:  Acute on chronic respiratory failure with hypoxia and acute on chronic combined systolic and diastolic CHF (congestive heart failure) ([place]): Improving slowly  - her SOB is most likely due to CHF exacerbation given elevated BNP of 844.   - Patient had COVID-19 infection in October, still had positive PCR on 11/28, but patient does not have fever, less likely to be still contributed to current issue.   -We will continue Lasix 20 mg bid by IV (pt received 60 mg of IV lasix in ED) for 1 more day and may switch to p.o. Lasix tomorrow -Patient is improving on diuresis however has not diuresed enough yet - Last Echo on  06/08/19, which showed EF of 40-45% with grade 1 diastolic dysfunction -Daily weights: lost 2 kg so far  -strict I/O's -Low salt diet -Fluid restriction -Wean off to room air from supplemental oxygen  Atrial fibrillation, chronic ([place]): HR 100-110s -continue metoprolol and cardizem -Coumadin per pharm -Patient is a still subtherapeutic INR  Polymyalgia rheumatica ([place]): - Cont 15 mg daily for now  - S/P 1 dose of Solu-Cortef 50 mg as stress dose on admission   CAD (coronary artery disease): -on ASA and zocor  HLD  (hyperlipidemia): -zocor  Hypothermia:  Stable now  -Bair Hugger PRN   Leucocytosis: Resolved now  WBC 12.7 on admission - . No fever.  Possibly due to steroid use -Follow-up of blood culture -- so far no growth   Electrolyte disorder: Found to be hypokalemic -Status post replacement -Repeat BMP in the morning  COVID-19 virus infection: Pt was COVID positive in October and still PCR positive on 06/09/19. No fever. -observe now - On precaution from admission   Disposition: Patient is to be discharged back to the facility where she came from -Repeat Covid test for SNF discharge ordered today with potential discharge in 24 hours   LOS: 2 days   Welden Hausmann Izetta Dakin

## 2019-07-16 NOTE — Plan of Care (Signed)
  Problem: Education: Goal: Knowledge of General Education information will improve Description: Including pain rating scale, medication(s)/side effects and non-pharmacologic comfort measures Outcome: Progressing   Problem: Coping: Goal: Level of anxiety will decrease Outcome: Progressing   Problem: Education: Goal: Knowledge of risk factors and measures for prevention of condition will improve Outcome: Progressing   Problem: Coping: Goal: Psychosocial and spiritual needs will be supported Outcome: Progressing

## 2019-07-17 LAB — PROTIME-INR
INR: 1.9 — ABNORMAL HIGH (ref 0.8–1.2)
Prothrombin Time: 21.3 seconds — ABNORMAL HIGH (ref 11.4–15.2)

## 2019-07-17 LAB — BASIC METABOLIC PANEL
Anion gap: 8 (ref 5–15)
BUN: 27 mg/dL — ABNORMAL HIGH (ref 8–23)
CO2: 31 mmol/L (ref 22–32)
Calcium: 8.6 mg/dL — ABNORMAL LOW (ref 8.9–10.3)
Chloride: 102 mmol/L (ref 98–111)
Creatinine, Ser: 0.58 mg/dL (ref 0.44–1.00)
GFR calc Af Amer: >60 mL/min (ref >60)
GFR calc non Af Amer: >60 mL/min (ref >60)
Glucose, Bld: 83 mg/dL (ref 70–99)
Potassium: 3.8 mmol/L (ref 3.5–5.1)
Sodium: 141 mmol/L (ref 135–145)

## 2019-07-17 LAB — SARS CORONAVIRUS 2 (TAT 6-24 HRS): SARS Coronavirus 2: NEGATIVE

## 2019-07-17 MED ORDER — WARFARIN SODIUM 3 MG PO TABS
3.0000 mg | ORAL_TABLET | Freq: Once | ORAL | Status: DC
  Filled 2019-07-17: qty 1

## 2019-07-17 MED ORDER — DM-GUAIFENESIN ER 30-600 MG PO TB12: 1.0000 | ORAL_TABLET | Freq: Two times a day (BID) | ORAL | 0 refills | Status: AC

## 2019-07-17 MED ORDER — IPRATROPIUM BROMIDE HFA 17 MCG/ACT IN AERS: 2.0000 | INHALATION_SPRAY | RESPIRATORY_TRACT | 3 refills | Status: AC

## 2019-07-17 MED ORDER — LISINOPRIL 2.5 MG PO TABS: 2.5000 mg | ORAL_TABLET | Freq: Every day | ORAL | 0 refills | Status: DC

## 2019-07-17 MED ORDER — POTASSIUM CHLORIDE ER 10 MEQ PO TBCR: 10.0000 meq | EXTENDED_RELEASE_TABLET | Freq: Every day | ORAL | 0 refills | Status: AC

## 2019-07-17 NOTE — Discharge Summary (Addendum)
Physician Discharge Summary  Patient ID: Judith Keller MRN: [id number] DOB/AGE: Mar 21, 1925 86 y.o.  Admit date: 07/14/2019 Discharge date: 07/17/2019  Admission Diagnoses:  Discharge Diagnoses:  Principal Problem:   Acute on chronic combined systolic and diastolic CHF (congestive heart failure) (HCC) Active Problems:   Atrial fibrillation, chronic (HCC)   Polymyalgia rheumatica (HCC)   CAD (coronary artery disease)   HLD (hyperlipidemia)   Hypothermia   Leucocytosis   COVID-19 virus infection   Acute on chronic respiratory failure with hypoxia [facility])   Discharged Condition: fair  Hospital Course:  Judith Jaquez Woodyis a 86 y.o.femalewith medical history significant ofCHF with EF 40-45%,hypertension, hyperlipidemia, atrial fibrillation on Coumadin, PMR on prednisone, CAD, COVID-19 infection in October (stillPCRpositive on 06/09/2019),who presents with shortness breath. Pt was COVID positive in October and still PCR positive on 06/09/19. Shehas been on oxygen chronically ever since.She developed worsening SOB since yesterday. No cough, fever or chills. No CP. No nausea, vomiting, diarrhea or abdominal pain.EMS report pt was on 8L upon arrival at 88%.  Acute on chronic respiratory failure with hypoxiaand acute on chronic combined systolic and diastolic CHF (congestive heart failure) ([place]): Improved to baseline with supp O2 need is 1-2 L/min now  -  most likely due to CHF exacerbation given elevated BNP of 844. - Patient had COVID-19 tested during this hospitalization and came back negative - S/P Lasix20 mg bid by IV; switch to p.o. Lasix  To Cont  -Last Echo on  06/08/19, which showedEF of 40-45% with grade 1 diastolic dysfunction -Daily weights: lost weight   -strict I/O's -Low salt diet -Fluid restriction - F/U PCP/ SNF MD in 2 weeks   Atrial fibrillation, chronic ([place]): HR controlled below 100  -continuemetoprololand cardizem -Coumadin home dose to resume  -INR is  close to target range - Recheck INR in 1-2 days and dose adjustment per SNF MD/providers   Polymyalgia rheumatica ([place]): Stable  - May resume home dose of prednisone   - S/P 1 dose of Solu-Cortef 50 mg as stress dose on admission   Leucocytosis: Resolved now  WBC 12.7 on admission - . No fever.Possibly due to steroid use -  blood culture -- so far no growth   Electrolyte disorder: Stable now  -Status post replacement -Repeat BMP as per facility   COVID-19 virus infection:Pt was COVID positive in [place] still PCR positive on 06/09/19. No fever. - Retested here this time and now negative  - Taken off of precaution per ID prevention committee   Disposition: Patient is to be discharged back to the facility where she came from -Repeat Covid test for SNF discharge tested negative   Consults: None  Significant Diagnostic Studies: Radiology and Blood work   Treatments: As per hospital course and d.c medlist   Discharge Exam: Blood pressure 131/74, pulse 87, temperature 98.6 F (37 C), temperature source Oral, resp. rate 18, height 5\' 3"  (1.6 m), weight 59.3 kg, SpO2 96 %.  General:Not in acute distress HEENT: Eyes: PERRL, EOMI, no scleral icterus. ENT: No discharge from the ears and nose, no pharynx injection, no tonsillar enlargement.  Neck:No JVD, no bruit, no mass felt. Heme:No neck lymph node enlargement. Cardiac:S1/S2, Irregular RR, No murmurs, No gallops or rubs. Respiratory:CTABL  [id number], nondistended, nontender, no rebound pain, no organomegaly, BS present. GU: No hematuria Ext: 2+DP/PT pulse bilaterally. Musculoskeletal:No joint deformities, No joint redness or warmth, no limitation of ROM in spin. Skin: No rashes.  Neuro: Alert, cranial nerves II-XII grossly intact, moves all  extremities normally.  Psych:Patient is not psychotic, no suicidal or hemocidal ideation.  Disposition: Discharge disposition: 03-Skilled Nursing  Facility     Stable to d/c to SNF where she came from. F/U SNF provided in 2 weeks or sooner. Pt is back to her home need or less than home use amount of O2 1-3L/min. Use supp O2 to transport the Pt to SNF.   With son [name] and updated on patient's condition.  Answered to all of his questions.  And expressed understanding. Discharge Instructions    Call MD for:  difficulty breathing, headache or visual disturbances   Complete by: As directed    Diet - low sodium heart healthy   Complete by: As directed    Walk with assistance   Complete by: As directed    Per PT/Ot/ SNF policy     Allergies as of 07/17/2019   No Known Allergies     Medication List    TAKE these medications   acetaminophen 325 MG tablet Commonly known as: TYLENOL Take 650 mg by mouth every 6 (six) hours as needed for mild pain.   albuterol 108 (90 Base) MCG/ACT inhaler Commonly known as: VENTOLIN HFA Inhale 2 puffs into the lungs every 4 (four) hours as needed for wheezing or shortness of breath.   aspirin EC 81 MG tablet Take 1 tablet (81 mg total) by mouth daily.   cholecalciferol 25 MCG (1000 UT) tablet Commonly known as: VITAMIN D Take 2,000 Units by mouth daily.   citalopram 10 MG tablet Commonly known as: CELEXA Take 10 mg by mouth daily.   dextromethorphan-guaiFENesin 30-600 MG 12hr tablet Commonly known as: MUCINEX DM Take 1 tablet by mouth 2 (two) times daily.   diltiazem 120 MG 24 hr capsule Commonly known as: DILACOR XR Take 120 mg by mouth daily.   fluticasone 50 MCG/ACT nasal spray Commonly known as: FLONASE Place 2 sprays into both nostrils at bedtime.   furosemide 20 MG tablet Commonly known as: LASIX Take 1 tablet (20 mg total) by mouth daily.   ipratropium 17 MCG/ACT inhaler Commonly known as: ATROVENT HFA Inhale 2 puffs into the lungs every 4 (four) hours.   lisinopril 2.5 MG tablet Commonly known as: ZESTRIL Take 1 tablet (2.5 mg total) by mouth daily. Start taking  on: July 18, 2019   Melatonin 3 MG Tabs Take 3 mg by mouth at bedtime.   metoprolol succinate 50 MG 24 hr tablet Commonly known as: TOPROL-XL Take 50 mg by mouth daily. Take with or immediately following a meal.   multivitamin with minerals Tabs tablet Take 1 tablet by mouth daily.   polyethylene glycol 17 g packet Commonly known as: MIRALAX / GLYCOLAX Take 17 g by mouth daily.   potassium chloride 10 MEQ tablet Commonly known as: KLOR-CON Take 1 tablet (10 mEq total) by mouth daily.   predniSONE 5 MG tablet Commonly known as: DELTASONE Take 5 mg by mouth daily with breakfast.   simvastatin 20 MG tablet Commonly known as: ZOCOR Take 20 mg by mouth at bedtime.   traMADol 50 MG tablet Commonly known as: ULTRAM Take 1 tablet (50 mg total) by mouth every 6 (six) hours as needed for moderate pain.   vitamin C 250 MG tablet Commonly known as: ASCORBIC ACID Take 500 mg by mouth daily.   warfarin 2.5 MG tablet Commonly known as: COUMADIN Take 2.5 mg by mouth every evening.      Contact information for after-discharge care    Destination  [facility] Preferred [facility]F .   Service: Skilled Nursing Contact information: [address] [place] [phone]              Signed: Thornell Mule 07/17/2019, 11:34 AM

## 2019-07-17 NOTE — Progress Notes (Signed)
Attempted to wean pt to room air, pt sating at 88/89%, placed on 1L, pt sating at 94%

## 2019-07-17 NOTE — Progress Notes (Signed)
Spoke to [employer], pt is on 3L chronically. Pt has been weaned to 1L while in hospital and is sating at 92%.

## 2019-07-17 NOTE — Discharge Instructions (Signed)
Heart Failure, Self Care Heart failure is a serious condition. This sheet explains things you need to do to take care of yourself at home. To help you stay as healthy as possible, you may be asked to change your diet, take certain medicines, and make other changes in your life. Your doctor may also give you more specific instructions. If you have problems or questions, call your doctor. What are the risks? Having heart failure makes it more likely for you to have some problems. These problems can get worse if you do not take good care of yourself. Problems may include:  Blood clotting problems. This may cause a stroke.  Damage to the kidneys, liver, or lungs.  Abnormal heart rhythms. Supplies needed:  Scale for weighing yourself.  Blood pressure monitor.  Notebook.  Medicines. How to care for yourself when you have heart failure Medicines Take over-the-counter and prescription medicines only as told by your doctor. Take your medicines every day.  Do not stop taking your medicine unless your doctor tells you to do so.  Do not skip any medicines.  Get your prescriptions refilled before you run out of medicine. This is important. Eating and drinking   Eat heart-healthy foods. Talk with a diet specialist (dietitian) to create an eating plan.  Choose foods that: ? Have no trans fat. ? Are low in saturated fat and cholesterol.  Choose healthy foods, such as: ? Fresh or frozen fruits and vegetables. ? Fish. ? Low-fat (lean) meats. ? Legumes, such as beans, peas, and lentils. ? Fat-free or low-fat dairy products. ? Whole-grain foods. ? High-fiber foods.  Limit salt (sodium) if told by your doctor. Ask your diet specialist to tell you which seasonings are healthy for your heart.  Cook in healthy ways instead of frying. Healthy ways of cooking include roasting, grilling, broiling, baking, poaching, steaming, and stir-frying.  Limit how much fluid you drink, if told by your  doctor. Alcohol use  Do not drink alcohol if: ? Your doctor tells you not to drink. ? Your heart was damaged by alcohol, or you have very bad heart failure. ? You are pregnant, may be pregnant, or are planning to become pregnant.  If you drink alcohol: ? Limit how much you use to:  0-1 drink a day for women.  0-2 drinks a day for men. ? Be aware of how much alcohol is in your drink. In the [place] one drink equals one 12 oz bottle of beer (355 mL), one 5 oz glass of wine (148 mL), or one 1 oz glass of hard liquor (44 mL). Lifestyle   Do not use any products that contain nicotine or tobacco, such as cigarettes, e-cigarettes, and chewing tobacco. If you need help quitting, ask your doctor. ? Do not use nicotine gum or patches before talking to your doctor.  Do not use illegal drugs.  Lose weight if told by your doctor.  Do physical activity if told by your doctor. Talk to your doctor before you begin an exercise if: ? You are an older adult. ? You have very bad heart failure.  Learn to manage stress. If you need help, ask your doctor.  Get rehab (rehabilitation) to help you stay independent and to help with your quality of life.  Plan time to rest when you get tired. Check weight and blood pressure   Weigh yourself every day. This will help you to know if fluid is building up in your body. ? Weigh yourself every morning   after you pee (urinate) and before you eat breakfast. ? Wear the same amount of clothing each time. ? Write down your daily weight. Give your record to your doctor.  Check and write down your blood pressure as told by your doctor.  Check your pulse as told by your doctor. Dealing with very hot and very cold weather  If it is very hot: ? Avoid activities that take a lot of energy. ? Use air conditioning or fans, or find a cooler place. ? Avoid caffeine and alcohol. ? Wear clothing that is loose-fitting, lightweight, and light-colored.  If it is very  cold: ? Avoid activities that take a lot of energy. ? Layer your clothes. ? Wear mittens or gloves, a hat, and a scarf when you go outside. ? Avoid alcohol. Follow these instructions at home:  Stay up to date with shots (vaccines). Get pneumococcal and flu (influenza) shots.  Keep all follow-up visits as told by your doctor. This is important. Contact a doctor if:  You gain weight quickly.  You have increasing shortness of breath.  You cannot do your normal activities.  You get tired easily.  You cough a lot.  You don't feel like eating or feel like you may vomit (nauseous).  You become puffy (swell) in your hands, feet, ankles, or belly (abdomen).  You cannot sleep well because it is hard to breathe.  You feel like your heart is beating fast (palpitations).  You get dizzy when you stand up. Get help right away if:  You have trouble breathing.  You or someone else notices a change in your behavior, such as having trouble staying awake.  You have chest pain or discomfort.  You pass out (faint). These symptoms may be an emergency. Do not wait to see if the symptoms will go away. Get medical help right away. Call your local emergency services (911 in the [place] Do not drive yourself to the hospital. Summary  Heart failure is a serious condition. To care for yourself, you may have to change your diet, take medicines, and make other lifestyle changes.  Take your medicines every day. Do not stop taking them unless your doctor tells you to do so.  Eat heart-healthy foods, such as fresh or frozen fruits and vegetables, fish, lean meats, legumes, fat-free or low-fat dairy products, and whole-grain or high-fiber foods.  Ask your doctor if you can drink alcohol. You may have to stop alcohol use if you have very bad heart failure.  Contact your doctor if you gain weight quickly or feel that your heart is beating too fast. Get help right away if you pass out, or have chest pain  or trouble breathing. This information is not intended to replace advice given to you by your health care provider. Make sure you discuss any questions you have with your health care provider. Document Revised: 10/10/2018 Document Reviewed: 10/11/2018 Elsevier Patient Education  [place] of Breath, Adult Shortness of breath means you have trouble breathing. Shortness of breath could be a sign of a medical problem. Follow these instructions at home:   Watch for any changes in your symptoms.  Do not use any products that contain nicotine or tobacco, such as cigarettes, e-cigarettes, and chewing tobacco.  Do not smoke. Smoking can cause shortness of breath. If you need help to quit smoking, ask your doctor.  Avoid things that can make it harder to breathe, such as: ? Mold. ? Dust. ? Air pollution. ?  Chemical smells. ? Things that can cause allergy symptoms (allergens), if you have allergies.  Keep your living space clean. Use products that help remove mold and dust.  Rest as needed. Slowly return to your normal activities.  Take over-the-counter and prescription medicines only as told by your doctor. This includes oxygen therapy and inhaled medicines.  Keep all follow-up visits as told by your doctor. This is important. Contact a doctor if:  Your condition does not get better as soon as expected.  You have a hard time doing your normal activities, even after you rest.  You have new symptoms. Get help right away if:  Your shortness of breath gets worse.  You have trouble breathing when you are resting.  You feel light-headed or you pass out (faint).  You have a cough that is not helped by medicines.  You cough up blood.  You have pain with breathing.  You have pain in your chest, arms, shoulders, or belly (abdomen).  You have a fever.  You cannot walk up stairs.  You cannot exercise the way you normally do. These symptoms may represent a  serious problem that is an emergency. Do not wait to see if the symptoms will go away. Get medical help right away. Call your local emergency services (911 in the [place] Do not drive yourself to the hospital. Summary  Shortness of breath is when you have trouble breathing enough air. It can be a sign of a medical problem.  Avoid things that make it hard for you to breathe, such as smoking, pollution, mold, and dust.  Watch for any changes in your symptoms. Contact your doctor if you do not get better or you get worse. This information is not intended to replace advice given to you by your health care provider. Make sure you discuss any questions you have with your health care provider. Document Revised: 11/28/2017 Document Reviewed: 11/28/2017 Elsevier Patient Education  [place].

## 2019-07-17 NOTE — Progress Notes (Signed)
ANTICOAGULATION CONSULT NOTE - Follow up [place] for Warfarin  Indication: atrial fibrillation  No Known Allergies  Patient Measurements: Height: 5\' 3"  (160 cm) Weight: 130 lb 11.7 oz (59.3 kg) IBW/kg (Calculated) : 52.4  Vital Signs: Temp: 98.6 F (37 C) (01/05 0739) Temp Source: Oral (01/05 0739) BP: 131/74 (01/05 0739) Pulse Rate: 87 (01/05 0739)  Labs: Recent Labs    07/15/19 0428 07/16/19 0604 07/16/19 0901 07/17/19 0501  HGB 8.5*  --   --   --   HCT 29.4*  --   --   --   PLT 410*  --   --   --   LABPROT 18.7*  --  19.8* 21.3*  INR 1.6*  --  1.7* 1.9*  CREATININE 0.55 0.66  --  0.58    Estimated Creatinine Clearance: 37.1 mL/min (by C-G formula based on SCr of 0.58 mg/dL).    Assessment: Pharmacy consulted to dose warfarin in this 86 year old female with Afib.  Pt was on warfarin 2.5 mg PO daily at home, last dose was on 1/01 @ 1700.   Date INR Warfarin Dose  1/2 1.7 3.5 mg  1/3 1.6 4 mg  1/4 1.7 3 mg  1/5 1.9      Goal of Therapy:  INR 2-3    Plan:  INR subtherapeutic. Will order warfarin 3 mg PO X 1 to help get INR therapeutic. Once therapeutic can reduce dose to original home dose and recheck INR with AM labs. CBC in AM per policy.  Rocky Morel, PharmD, BCPS 07/17/2019,10:11 AM

## 2019-07-17 NOTE — TOC Transition Note (Signed)
Transition of Care [facility]) - CM/SW Discharge Note   Patient Details  Name: [name] MRN: [id number] Date of Birth: [birth year]  Transition of [facility]) CM/SW Contact:  Candie Chroman, LCSW Phone Number: 07/17/2019, 12:32 PM   Clinical Narrative: Patient has orders to discharge back to [facility] today. Son is aware. RN will call report to [phone] (Room 802) prior to setting up EMS transport. No further concerns. CSW signing off.   Final next level of care: [place] Barriers to Discharge: Barriers Resolved   Patient Goals and CMS Choice     Choice offered to / list presented to : NA  Discharge Placement   Existing PASRR number confirmed : 07/16/19          Patient [facility] at: [facility] [place] Patient to be transferred to facility by: EMS Name of family member notified: Quentasia Stmarie Patient and family notified of of transfer: 07/17/19  Discharge Plan and Services     Post Acute Care Choice: [place]                               Social Determinants of Health (SDOH) Interventions     Readmission Risk Interventions Readmission Risk Prevention Plan 07/17/2019 11/18/2018 11/17/2018  Transportation Screening - Complete Complete  PCP or Specialist Appt within 3-5 Days Complete Complete -  HRI or Home Care Consult - Not Complete Complete  HRI or Home Care Consult comments - Patient returning to SNF. -  Social Work Consult for [place] Planning/Counseling Complete Complete Complete  Palliative Care Screening Not Applicable Not Applicable -  Medication Review [employer]) - Complete Complete  Some recent data might be hidden

## 2019-07-17 NOTE — Care Management Important Message (Signed)
Important Message  Patient Details  Name: Judith Keller MRN: [id number] Date of Birth: [birth year]   Medicare Important Message Given:  Yes     Dannette Barbara 07/17/2019, 12:41 PM

## 2019-07-17 NOTE — Plan of Care (Signed)

## 2019-07-19 LAB — CULTURE, BLOOD (ROUTINE X 2)
Culture: NO GROWTH
Culture: NO GROWTH
Special Requests: ADEQUATE
Special Requests: ADEQUATE

## 2021-01-30 ENCOUNTER — Inpatient Hospital Stay: Payer: Medicare Other | Admitting: Registered Nurse

## 2021-01-30 ENCOUNTER — Emergency Department: Payer: Medicare Other

## 2021-01-30 ENCOUNTER — Encounter
Admission: EM | Disposition: A | Discharge status: Skilled Nursing Facility | Payer: Self-pay | Source: Skilled Nursing Facility | Attending: Obstetrics and Gynecology

## 2021-01-30 ENCOUNTER — Inpatient Hospital Stay: Payer: Medicare Other

## 2021-01-30 ENCOUNTER — Other Ambulatory Visit: Payer: Self-pay

## 2021-01-30 ENCOUNTER — Encounter: Payer: Self-pay | Admitting: Radiology

## 2021-01-30 ENCOUNTER — Inpatient Hospital Stay
Admission: EM | Admit: 2021-01-30 | Discharge: 2021-02-02 | DRG: 853 | Disposition: A | Discharge status: Skilled Nursing Facility | Payer: Medicare Other | Source: Skilled Nursing Facility | Attending: Obstetrics and Gynecology | Admitting: Obstetrics and Gynecology

## 2021-01-30 DIAGNOSIS — N3090 Cystitis, unspecified without hematuria: Secondary | ICD-10-CM | POA: Diagnosis not present

## 2021-01-30 DIAGNOSIS — N179 Acute kidney failure, unspecified: Principal | ICD-10-CM | POA: Diagnosis present

## 2021-01-30 DIAGNOSIS — Z20822 Contact with and (suspected) exposure to covid-19: Secondary | ICD-10-CM | POA: Diagnosis present

## 2021-01-30 DIAGNOSIS — I340 Nonrheumatic mitral (valve) insufficiency: Secondary | ICD-10-CM | POA: Diagnosis not present

## 2021-01-30 DIAGNOSIS — E785 Hyperlipidemia, unspecified: Secondary | ICD-10-CM | POA: Diagnosis present

## 2021-01-30 DIAGNOSIS — F329 Major depressive disorder, single episode, unspecified: Secondary | ICD-10-CM | POA: Diagnosis present

## 2021-01-30 DIAGNOSIS — R0602 Shortness of breath: Secondary | ICD-10-CM | POA: Diagnosis present

## 2021-01-30 DIAGNOSIS — N134 Hydroureter: Secondary | ICD-10-CM

## 2021-01-30 DIAGNOSIS — F0390 Unspecified dementia without behavioral disturbance: Secondary | ICD-10-CM | POA: Diagnosis present

## 2021-01-30 DIAGNOSIS — R4182 Altered mental status, unspecified: Secondary | ICD-10-CM | POA: Diagnosis present

## 2021-01-30 DIAGNOSIS — Z66 Do not resuscitate: Secondary | ICD-10-CM | POA: Diagnosis present

## 2021-01-30 DIAGNOSIS — R6520 Severe sepsis without septic shock: Secondary | ICD-10-CM | POA: Diagnosis not present

## 2021-01-30 DIAGNOSIS — E872 Acidosis: Secondary | ICD-10-CM | POA: Diagnosis present

## 2021-01-30 DIAGNOSIS — R7401 Elevation of levels of liver transaminase levels: Secondary | ICD-10-CM

## 2021-01-30 DIAGNOSIS — J9621 Acute and chronic respiratory failure with hypoxia: Secondary | ICD-10-CM | POA: Diagnosis present

## 2021-01-30 DIAGNOSIS — Z7982 Long term (current) use of aspirin: Secondary | ICD-10-CM

## 2021-01-30 DIAGNOSIS — I482 Chronic atrial fibrillation, unspecified: Secondary | ICD-10-CM | POA: Diagnosis present

## 2021-01-30 DIAGNOSIS — Z79890 Hormone replacement therapy (postmenopausal): Secondary | ICD-10-CM

## 2021-01-30 DIAGNOSIS — N1330 Unspecified hydronephrosis: Secondary | ICD-10-CM | POA: Diagnosis not present

## 2021-01-30 DIAGNOSIS — Z9011 Acquired absence of right breast and nipple: Secondary | ICD-10-CM

## 2021-01-30 DIAGNOSIS — M353 Polymyalgia rheumatica: Secondary | ICD-10-CM | POA: Diagnosis present

## 2021-01-30 DIAGNOSIS — N201 Calculus of ureter: Secondary | ICD-10-CM | POA: Diagnosis not present

## 2021-01-30 DIAGNOSIS — A419 Sepsis, unspecified organism: Secondary | ICD-10-CM | POA: Diagnosis present

## 2021-01-30 DIAGNOSIS — I5042 Chronic combined systolic (congestive) and diastolic (congestive) heart failure: Secondary | ICD-10-CM | POA: Diagnosis present

## 2021-01-30 DIAGNOSIS — Z79899 Other long term (current) drug therapy: Secondary | ICD-10-CM

## 2021-01-30 DIAGNOSIS — Z8616 Personal history of COVID-19: Secondary | ICD-10-CM

## 2021-01-30 DIAGNOSIS — E039 Hypothyroidism, unspecified: Secondary | ICD-10-CM | POA: Diagnosis present

## 2021-01-30 DIAGNOSIS — Z96641 Presence of right artificial hip joint: Secondary | ICD-10-CM | POA: Diagnosis present

## 2021-01-30 DIAGNOSIS — I509 Heart failure, unspecified: Secondary | ICD-10-CM | POA: Diagnosis not present

## 2021-01-30 DIAGNOSIS — J9811 Atelectasis: Secondary | ICD-10-CM | POA: Diagnosis present

## 2021-01-30 DIAGNOSIS — I2510 Atherosclerotic heart disease of native coronary artery without angina pectoris: Secondary | ICD-10-CM | POA: Diagnosis present

## 2021-01-30 DIAGNOSIS — N136 Pyonephrosis: Secondary | ICD-10-CM | POA: Diagnosis present

## 2021-01-30 DIAGNOSIS — R778 Other specified abnormalities of plasma proteins: Secondary | ICD-10-CM | POA: Diagnosis not present

## 2021-01-30 DIAGNOSIS — G928 Other toxic encephalopathy: Secondary | ICD-10-CM | POA: Diagnosis present

## 2021-01-30 DIAGNOSIS — M1990 Unspecified osteoarthritis, unspecified site: Secondary | ICD-10-CM | POA: Diagnosis present

## 2021-01-30 DIAGNOSIS — I361 Nonrheumatic tricuspid (valve) insufficiency: Secondary | ICD-10-CM | POA: Diagnosis not present

## 2021-01-30 DIAGNOSIS — I110 Hypertensive heart disease with heart failure: Secondary | ICD-10-CM | POA: Diagnosis present

## 2021-01-30 DIAGNOSIS — Z7901 Long term (current) use of anticoagulants: Secondary | ICD-10-CM

## 2021-01-30 DIAGNOSIS — N200 Calculus of kidney: Secondary | ICD-10-CM

## 2021-01-30 HISTORY — PX: CYSTOSCOPY WITH STENT PLACEMENT: SHX5790

## 2021-01-30 LAB — CBC WITH DIFFERENTIAL/PLATELET
Abs Immature Granulocytes: 1.71 10*3/uL — ABNORMAL HIGH (ref 0.00–0.07)
Basophils Absolute: 0.1 10*3/uL (ref 0.0–0.1)
Basophils Relative: 0 %
Eosinophils Absolute: 0 10*3/uL (ref 0.0–0.5)
Eosinophils Relative: 0 %
HCT: 41.4 % (ref 36.0–46.0)
Hemoglobin: 13.2 g/dL (ref 12.0–15.0)
Immature Granulocytes: 4 %
Lymphocytes Relative: 2 %
Lymphs Abs: 0.6 10*3/uL — ABNORMAL LOW (ref 0.7–4.0)
MCH: 31.4 pg (ref 26.0–34.0)
MCHC: 31.9 g/dL (ref 30.0–36.0)
MCV: 98.3 fL (ref 80.0–100.0)
Monocytes Absolute: 1.5 10*3/uL — ABNORMAL HIGH (ref 0.1–1.0)
Monocytes Relative: 4 %
Neutro Abs: 34.7 10*3/uL — ABNORMAL HIGH (ref 1.7–7.7)
Neutrophils Relative %: 90 %
Platelets: 229 10*3/uL (ref 150–400)
RBC: 4.21 MIL/uL (ref 3.87–5.11)
RDW: 14.1 % (ref 11.5–15.5)
Smear Review: NORMAL
WBC: 38.6 10*3/uL — ABNORMAL HIGH (ref 4.0–10.5)
nRBC: 0 % (ref 0.0–0.2)

## 2021-01-30 LAB — URINALYSIS, COMPLETE (UACMP) WITH MICROSCOPIC
Bilirubin Urine: NEGATIVE
Glucose, UA: NEGATIVE mg/dL
Ketones, ur: NEGATIVE mg/dL
Nitrite: NEGATIVE
Protein, ur: 30 mg/dL — AB
Specific Gravity, Urine: 1.019 (ref 1.005–1.030)
pH: 5 (ref 5.0–8.0)

## 2021-01-30 LAB — LACTIC ACID, PLASMA: Lactic Acid, Venous: 4 mmol/L (ref 0.5–1.9)

## 2021-01-30 LAB — RESP PANEL BY RT-PCR (FLU A&B, COVID) ARPGX2
Influenza A by PCR: NEGATIVE
Influenza B by PCR: NEGATIVE
SARS Coronavirus 2 by RT PCR: NEGATIVE

## 2021-01-30 LAB — PROCALCITONIN: Procalcitonin: >150 ng/mL

## 2021-01-30 LAB — APTT: aPTT: 45 seconds — ABNORMAL HIGH (ref 24–36)

## 2021-01-30 LAB — COMPREHENSIVE METABOLIC PANEL
ALT: 41 U/L (ref 0–44)
AST: 55 U/L — ABNORMAL HIGH (ref 15–41)
Albumin: 3.6 g/dL (ref 3.5–5.0)
Alkaline Phosphatase: 74 U/L (ref 38–126)
Anion gap: 11 (ref 5–15)
BUN: 39 mg/dL — ABNORMAL HIGH (ref 8–23)
CO2: 24 mmol/L (ref 22–32)
Calcium: 8.2 mg/dL — ABNORMAL LOW (ref 8.9–10.3)
Chloride: 105 mmol/L (ref 98–111)
Creatinine, Ser: 2.24 mg/dL — ABNORMAL HIGH (ref 0.44–1.00)
GFR, Estimated: 20 mL/min — ABNORMAL LOW (ref >60)
Glucose, Bld: 111 mg/dL — ABNORMAL HIGH (ref 70–99)
Potassium: 4.8 mmol/L (ref 3.5–5.1)
Sodium: 140 mmol/L (ref 135–145)
Total Bilirubin: 1 mg/dL (ref 0.3–1.2)
Total Protein: 6.2 g/dL — ABNORMAL LOW (ref 6.5–8.1)

## 2021-01-30 LAB — PROTIME-INR
INR: 2.6 — ABNORMAL HIGH (ref 0.8–1.2)
Prothrombin Time: 27.9 seconds — ABNORMAL HIGH (ref 11.4–15.2)

## 2021-01-30 LAB — TROPONIN I (HIGH SENSITIVITY): Troponin I (High Sensitivity): 117 ng/L (ref <18)

## 2021-01-30 SURGERY — CYSTOSCOPY, WITH STENT INSERTION
Anesthesia: General | Laterality: Right

## 2021-01-30 MED ORDER — SUCCINYLCHOLINE CHLORIDE 20 MG/ML IJ SOLN
INTRAMUSCULAR | Status: DC | PRN
  Administered 2021-01-30: 80 mg via INTRAVENOUS

## 2021-01-30 MED ORDER — ONDANSETRON HCL 4 MG/2ML IJ SOLN: 4.0000 mg | Freq: Once | INTRAMUSCULAR | Status: DC | PRN

## 2021-01-30 MED ORDER — ONDANSETRON HCL 4 MG/2ML IJ SOLN
INTRAMUSCULAR | Status: DC | PRN
  Administered 2021-01-30: 4 mg via INTRAVENOUS

## 2021-01-30 MED ORDER — METOPROLOL SUCCINATE ER 50 MG PO TB24: 100.0000 mg | ORAL_TABLET | Freq: Every day | ORAL | Status: DC

## 2021-01-30 MED ORDER — ROCURONIUM BROMIDE 100 MG/10ML IV SOLN
INTRAVENOUS | Status: DC | PRN
Start: 2021-01-30 — End: 2021-01-30
  Administered 2021-01-30: 20 mg via INTRAVENOUS

## 2021-01-30 MED ORDER — ACETAMINOPHEN 10 MG/ML IV SOLN: 1000.0000 mg | Freq: Once | INTRAVENOUS | Status: DC | PRN

## 2021-01-30 MED ORDER — PHENYLEPHRINE HCL (PRESSORS) 10 MG/ML IV SOLN
INTRAVENOUS | Status: DC | PRN
  Administered 2021-01-30: 200 ug via INTRAVENOUS
  Administered 2021-01-30: 100 ug via INTRAVENOUS
  Administered 2021-01-30 (×3): 200 ug via INTRAVENOUS

## 2021-01-30 MED ORDER — SODIUM CHLORIDE 0.9 % IV SOLN: INTRAVENOUS | Status: DC

## 2021-01-30 MED ORDER — SUGAMMADEX SODIUM 200 MG/2ML IV SOLN
INTRAVENOUS | Status: DC | PRN
  Administered 2021-01-30: 200 mg via INTRAVENOUS

## 2021-01-30 MED ORDER — FLUTICASONE PROPIONATE 50 MCG/ACT NA SUSP
2.0000 | Freq: Every day | NASAL | Status: DC
  Administered 2021-01-31 – 2021-02-01 (×2): 2 via NASAL
  Filled 2021-01-30: qty 16

## 2021-01-30 MED ORDER — DILTIAZEM HCL ER 60 MG PO CP12
120.0000 mg | ORAL_CAPSULE | Freq: Every day | ORAL | Status: DC
  Filled 2021-01-30: qty 2

## 2021-01-30 MED ORDER — IPRATROPIUM BROMIDE HFA 17 MCG/ACT IN AERS: 2.0000 | INHALATION_SPRAY | RESPIRATORY_TRACT | Status: DC

## 2021-01-30 MED ORDER — ACETAMINOPHEN 10 MG/ML IV SOLN
INTRAVENOUS | Status: AC
  Filled 2021-01-30: qty 100

## 2021-01-30 MED ORDER — LACTATED RINGERS IV BOLUS
1000.0000 mL | Freq: Once | INTRAVENOUS | Status: AC
  Administered 2021-01-30: 1000 mL via INTRAVENOUS

## 2021-01-30 MED ORDER — DEXAMETHASONE SODIUM PHOSPHATE 10 MG/ML IJ SOLN
INTRAMUSCULAR | Status: AC
  Filled 2021-01-30: qty 1

## 2021-01-30 MED ORDER — PHENYLEPHRINE HCL-NACL 10-0.9 MG/250ML-% IV SOLN
25.0000 ug/min | INTRAVENOUS | Status: DC
  Administered 2021-01-31: 50 ug/min via INTRAVENOUS
  Filled 2021-01-30 (×2): qty 250

## 2021-01-30 MED ORDER — HEPARIN SODIUM (PORCINE) 5000 UNIT/ML IJ SOLN
5000.0000 [IU] | Freq: Three times a day (TID) | INTRAMUSCULAR | Status: DC
  Administered 2021-01-31: 5000 [IU] via SUBCUTANEOUS
  Filled 2021-01-30: qty 1

## 2021-01-30 MED ORDER — VANCOMYCIN HCL IN DEXTROSE 1-5 GM/200ML-% IV SOLN
1000.0000 mg | Freq: Once | INTRAVENOUS | Status: AC
  Administered 2021-01-30: 1000 mg via INTRAVENOUS
  Filled 2021-01-30: qty 200

## 2021-01-30 MED ORDER — IPRATROPIUM-ALBUTEROL 0.5-2.5 (3) MG/3ML IN SOLN
3.0000 mL | Freq: Once | RESPIRATORY_TRACT | Status: AC
  Administered 2021-01-30: 3 mL via RESPIRATORY_TRACT

## 2021-01-30 MED ORDER — CITALOPRAM HYDROBROMIDE 20 MG PO TABS
10.0000 mg | ORAL_TABLET | Freq: Every day | ORAL | Status: DC
  Administered 2021-01-31 – 2021-02-02 (×3): 10 mg via ORAL
  Filled 2021-01-30 (×3): qty 1

## 2021-01-30 MED ORDER — VANCOMYCIN VARIABLE DOSE PER UNSTABLE RENAL FUNCTION (PHARMACIST DOSING): Status: DC

## 2021-01-30 MED ORDER — LACTATED RINGERS IV SOLN: INTRAVENOUS | Status: DC | PRN

## 2021-01-30 MED ORDER — IOHEXOL 180 MG/ML  SOLN
INTRAMUSCULAR | Status: DC | PRN
  Administered 2021-01-30: 10 mL

## 2021-01-30 MED ORDER — CHLORHEXIDINE GLUCONATE CLOTH 2 % EX PADS
6.0000 | MEDICATED_PAD | Freq: Every day | CUTANEOUS | Status: DC
  Administered 2021-01-31 – 2021-02-02 (×3): 6 via TOPICAL

## 2021-01-30 MED ORDER — IPRATROPIUM-ALBUTEROL 0.5-2.5 (3) MG/3ML IN SOLN
RESPIRATORY_TRACT | Status: AC
  Filled 2021-01-30: qty 3

## 2021-01-30 MED ORDER — SODIUM CHLORIDE 0.9 % IV SOLN
2.0000 g | INTRAVENOUS | Status: DC
  Administered 2021-01-31: 2 g via INTRAVENOUS
  Filled 2021-01-30: qty 2

## 2021-01-30 MED ORDER — ALBUTEROL SULFATE HFA 108 (90 BASE) MCG/ACT IN AERS: 2.0000 | INHALATION_SPRAY | RESPIRATORY_TRACT | Status: DC | PRN

## 2021-01-30 MED ORDER — ACETAMINOPHEN 325 MG PO TABS
650.0000 mg | ORAL_TABLET | Freq: Four times a day (QID) | ORAL | Status: DC | PRN
  Administered 2021-02-01: 650 mg via ORAL
  Filled 2021-01-30: qty 2

## 2021-01-30 MED ORDER — IPRATROPIUM BROMIDE 0.02 % IN SOLN
0.5000 mg | RESPIRATORY_TRACT | Status: DC
  Administered 2021-01-31 (×2): 0.5 mg via RESPIRATORY_TRACT

## 2021-01-30 MED ORDER — FENTANYL CITRATE (PF) 100 MCG/2ML IJ SOLN
INTRAMUSCULAR | Status: AC
  Filled 2021-01-30: qty 2

## 2021-01-30 MED ORDER — ACETAMINOPHEN 325 MG PO TABS: 650.0000 mg | ORAL_TABLET | Freq: Four times a day (QID) | ORAL | Status: DC | PRN

## 2021-01-30 MED ORDER — ACETAMINOPHEN 10 MG/ML IV SOLN
INTRAVENOUS | Status: DC | PRN
  Administered 2021-01-30: 1000 mg via INTRAVENOUS

## 2021-01-30 MED ORDER — SODIUM CHLORIDE 0.9 % IV SOLN
2.0000 g | Freq: Once | INTRAVENOUS | Status: AC
  Administered 2021-01-30: 2 g via INTRAVENOUS
  Filled 2021-01-30: qty 2

## 2021-01-30 MED ORDER — DEXAMETHASONE SODIUM PHOSPHATE 10 MG/ML IJ SOLN
INTRAMUSCULAR | Status: DC | PRN
  Administered 2021-01-30: 5 mg via INTRAVENOUS

## 2021-01-30 MED ORDER — FAMOTIDINE 20 MG PO TABS: 10.0000 mg | ORAL_TABLET | Freq: Every day | ORAL | Status: DC

## 2021-01-30 MED ORDER — LEVOTHYROXINE SODIUM 50 MCG PO TABS
50.0000 ug | ORAL_TABLET | Freq: Every day | ORAL | Status: DC
  Administered 2021-02-01 – 2021-02-02 (×2): 50 ug via ORAL
  Filled 2021-01-30 (×2): qty 1

## 2021-01-30 MED ORDER — ALBUTEROL SULFATE (2.5 MG/3ML) 0.083% IN NEBU: 2.5000 mg | INHALATION_SOLUTION | RESPIRATORY_TRACT | Status: DC | PRN

## 2021-01-30 MED ORDER — SUCCINYLCHOLINE CHLORIDE 200 MG/10ML IV SOSY
PREFILLED_SYRINGE | INTRAVENOUS | Status: AC
  Filled 2021-01-30: qty 10

## 2021-01-30 MED ORDER — FENTANYL CITRATE (PF) 100 MCG/2ML IJ SOLN: 25.0000 ug | INTRAMUSCULAR | Status: DC | PRN

## 2021-01-30 MED ORDER — SODIUM CHLORIDE 0.9 % IV SOLN
250.0000 mL | INTRAVENOUS | Status: DC
  Administered 2021-01-31: 250 mL via INTRAVENOUS

## 2021-01-30 MED ORDER — ONDANSETRON HCL 4 MG/2ML IJ SOLN
4.0000 mg | INTRAMUSCULAR | Status: AC
  Administered 2021-01-30: 4 mg via INTRAVENOUS
  Filled 2021-01-30: qty 2

## 2021-01-30 MED ORDER — ACETAMINOPHEN 650 MG RE SUPP: 650.0000 mg | Freq: Four times a day (QID) | RECTAL | Status: DC | PRN

## 2021-01-30 MED ORDER — FENTANYL CITRATE (PF) 100 MCG/2ML IJ SOLN
INTRAMUSCULAR | Status: DC | PRN
  Administered 2021-01-30: 50 ug via INTRAVENOUS

## 2021-01-30 MED ORDER — ROCURONIUM BROMIDE 10 MG/ML (PF) SYRINGE
PREFILLED_SYRINGE | INTRAVENOUS | Status: AC
  Filled 2021-01-30: qty 10

## 2021-01-30 MED ORDER — LACTATED RINGERS IV SOLN: INTRAVENOUS | Status: DC

## 2021-01-30 MED ORDER — PROPOFOL 10 MG/ML IV BOLUS
INTRAVENOUS | Status: DC | PRN
  Administered 2021-01-30: 60 mg via INTRAVENOUS

## 2021-01-30 MED ORDER — LACTATED RINGERS IV BOLUS
500.0000 mL | Freq: Once | INTRAVENOUS | Status: AC
  Administered 2021-01-30: 500 mL via INTRAVENOUS

## 2021-01-30 MED ORDER — ONDANSETRON HCL 4 MG PO TABS: 8.0000 mg | ORAL_TABLET | Freq: Three times a day (TID) | ORAL | Status: DC | PRN

## 2021-01-30 MED ORDER — LIDOCAINE HCL (CARDIAC) PF 100 MG/5ML IV SOSY
PREFILLED_SYRINGE | INTRAVENOUS | Status: DC | PRN
  Administered 2021-01-30: 60 mg via INTRAVENOUS

## 2021-01-30 SURGICAL SUPPLY — 27 items
BAG DRAIN CYSTO-URO LG1000N (MISCELLANEOUS) ×2 IMPLANT
BAG URINE DRAIN UROCATCH STRL (MISCELLANEOUS) ×2 IMPLANT
BRUSH SCRUB EZ 1% IODOPHOR (MISCELLANEOUS) ×2 IMPLANT
CATH FOLEY 2WAY  5CC 16FR (CATHETERS) ×1
CATH URETL 5X70 OPEN END (CATHETERS) ×2 IMPLANT
CATH URTH 16FR FL 2W BLN LF (CATHETERS) ×1 IMPLANT
CNTNR SPEC 2.5X3XGRAD LEK (MISCELLANEOUS) ×1
CONT SPEC 4OZ STER OR WHT (MISCELLANEOUS) ×1
CONTAINER SPEC 2.5X3XGRAD LEK (MISCELLANEOUS) ×1 IMPLANT
GAUZE 4X4 16PLY ~~LOC~~+RFID DBL (SPONGE) ×2 IMPLANT
GLIDEWIRE STR 0.035 150CM 3CM (WIRE) ×2 IMPLANT
GLOVE SURG UNDER POLY LF SZ7.5 (GLOVE) ×8 IMPLANT
GOWN STRL REUS W/ TWL XL LVL3 (GOWN DISPOSABLE) ×2 IMPLANT
GOWN STRL REUS W/TWL XL LVL3 (GOWN DISPOSABLE) ×2
GUIDEWIRE STR DUAL SENSOR (WIRE) ×2 IMPLANT
IV NS IRRIG 3000ML ARTHROMATIC (IV SOLUTION) ×2 IMPLANT
KIT TURNOVER CYSTO (KITS) ×2 IMPLANT
MANIFOLD NEPTUNE II (INSTRUMENTS) IMPLANT
NEEDLE FILTER BLUNT 18X 1/2SAF (NEEDLE) ×1
NEEDLE FILTER BLUNT 18X1 1/2 (NEEDLE) ×1 IMPLANT
PACK CYSTO AR (MISCELLANEOUS) ×2 IMPLANT
SET CYSTO W/LG BORE CLAMP LF (SET/KITS/TRAYS/PACK) ×2 IMPLANT
STENT URET 6FRX24 CONTOUR (STENTS) ×2 IMPLANT
STENT URET 6FRX26 CONTOUR (STENTS) IMPLANT
SURGILUBE 2OZ TUBE FLIPTOP (MISCELLANEOUS) ×2 IMPLANT
SYR 10ML LL (SYRINGE) ×2 IMPLANT
WATER STERILE IRR 1000ML POUR (IV SOLUTION) ×2 IMPLANT

## 2021-01-30 NOTE — Anesthesia Procedure Notes (Signed)
Procedure Name: Intubation Date/Time: 01/30/2021 9:15 PM Performed by: Arita Miss, MD Pre-anesthesia Checklist: Patient identified, Patient being monitored, Timeout performed, Emergency Drugs available and Suction available Patient Re-evaluated:Patient Re-evaluated prior to induction Oxygen Delivery Method: Circle system utilized Preoxygenation: Pre-oxygenation with 100% oxygen Induction Type: IV induction, Rapid sequence and Cricoid Pressure applied Laryngoscope Size: Mac and 3 Grade View: Grade I Tube type: Oral Tube size: 6.5 mm Number of attempts: 1 Airway Equipment and Method: Stylet Placement Confirmation: ETT inserted through vocal cords under direct vision, positive ETCO2 and breath sounds checked- equal and bilateral Secured at: 21 cm Tube secured with: Tape Dental Injury: Teeth and Oropharynx as per pre-operative assessment  Comments: Video laryngoscope malfunction initially prior to attempt at intubation, switched to DL. No gastric contents noted in oropharynx.

## 2021-01-30 NOTE — ED Notes (Signed)
Patient transported to CT 

## 2021-01-30 NOTE — ED Notes (Signed)
Critical troponin 117.

## 2021-01-30 NOTE — Consult Note (Signed)
Urology Consult  Requesting physician: Dr. Jacqualine Code  Reason for consultation: Severe sepsis secondary to pyelonephritis/obstructing stone  Chief Complaint: N/A  History of Present Illness: Judith Keller is a 87 y.o. female who was noted to have increased confusion over the past 24 hours and lab abnormalities at her nursing facility today showing leukocytosis of 24,000.  She was transported to the ED for further evaluation.  2 episodes of emesis on transport to the ED history is not obtainable secondary to altered mental status and was obtained from chart review and her daughter who accompanied her to the ED and is her HCPOA  Evaluation in the ED remarkable for: Lactic acid 4.0 Creatinine 2.24 (baseline WBC 38.6 Hypotension with BP 97/47 on arrival to ED Unremarkable head CT CT chest/abdomen/pelvis with mild to moderate right hydronephrosis most likely secondary to a 2 mm right UVJ calculus.  Extensive perinephric stranding of the right kidney COVID test negative Urinalysis pending  According to her daughter she is normally alert and oriented at baseline  Past Medical History:  Diagnosis Date   Atrial fibrillation (Home)    Heart failure ([place])    Hyperlipidemia    Hypertension    Hypokalemia    Osteoarthritis     Past Surgical History:  Procedure Laterality Date   FOOT SURGERY     HIP ARTHROPLASTY Right 11/16/2018   Procedure: ARTHROPLASTY BIPOLAR HIP (HEMIARTHROPLASTY);  Surgeon: Lovell Sheehan, MD;  Location: [facility] ORS;  Service: Orthopedics;  Laterality: Right;   MASTECTOMY Right     Home Medications:  Current Meds  Medication Sig   aspirin EC 81 MG tablet Take 81 mg by mouth daily. Swallow whole.   cholecalciferol (VITAMIN D) 25 MCG (1000 UT) tablet Take 2,000 Units by mouth daily.   citalopram (CELEXA) 10 MG tablet Take 10 mg by mouth daily.   famotidine (PEPCID) 20 MG tablet Take 20 mg by mouth 2 (two) times daily.   fluticasone (FLONASE) 50 MCG/ACT nasal spray  Place 2 sprays into both nostrils at bedtime.   furosemide (LASIX) 20 MG tablet Take 1 tablet (20 mg total) by mouth daily.   levothyroxine (SYNTHROID) 50 MCG tablet Take 50 mcg by mouth daily before breakfast.   Melatonin 3 MG TABS Take 6 mg by mouth at bedtime.   metoprolol succinate (TOPROL-XL) 100 MG 24 hr tablet Take 100 mg by mouth daily. Take with or immediately following a meal.   Multiple Vitamin (MULTIVITAMIN WITH MINERALS) TABS tablet Take 1 tablet by mouth daily.   mupirocin ointment (BACTROBAN) 2 % Apply 1 application topically 3 (three) times daily.   nystatin cream (MYCOSTATIN) Apply 1 application topically 2 (two) times daily.   potassium chloride (KLOR-CON) 10 MEQ tablet Take 1 tablet (10 mEq total) by mouth daily.   predniSONE (DELTASONE) 5 MG tablet Take 5 mg by mouth daily with breakfast.   warfarin (COUMADIN) 1 MG tablet Take 1 mg by mouth daily. Take along with one 2.5 mg tablet for total 3.5 mg once daily   warfarin (COUMADIN) 2.5 MG tablet Take 2.5 mg by mouth every evening.    Allergies: No Known Allergies  Family History  Problem Relation Age of Onset   Vision loss Mother    Aneurysm Father     Social History:  reports that she has never smoked. She has never used smokeless tobacco. She reports previous alcohol use. She reports that she does not use drugs.  ROS: Not obtainable  Physical Exam:  Vital signs in last 24 hours: Pulse Rate:  [95-106] 95 (07/22 2000) Resp:  [17-31] 28 (07/22 2000) BP: (97-133)/(44-85) 116/62 (07/22 2000) SpO2:  [94 %-100 %] 100 % (07/22 2000) Weight:  [59 kg] 59 kg (07/22 1650) Constitutional: Ill-appearing female, somnolent HEENT: [place] AT, dry mucus membranes.  Trachea midline, no masses Cardiovascular: Regular rate and rhythm, no clubbing, cyanosis, or edema. Respiratory: Normal respiratory effort, lungs clear bilaterally GI: Abdomen is soft, nontender, nondistended, no abdominal masses GU: No CVA tenderness Skin: No rashes,  bruises or suspicious lesions Neurologic: Grossly intact, no focal deficits, moving all 4 extremities Psychiatric: Somnolent   Laboratory Data:  Recent Labs    01/30/21 1707  WBC 38.6*  HGB 13.2  HCT 41.4   Recent Labs    01/30/21 1707  NA 140  K 4.8  CL 105  CO2 24  GLUCOSE 111*  BUN 39*  CREATININE 2.24*  CALCIUM 8.2*   Recent Labs    01/30/21 1707  INR 2.6*   No results for input(s): LABURIN in the last 72 hours. Results for orders placed or performed during the hospital encounter of 01/30/21  Resp Panel by RT-PCR (Flu A&B, Covid) Nasopharyngeal Swab     Status: None   Collection Time: 01/30/21  5:07 PM   Specimen: Nasopharyngeal Swab; Nasopharyngeal(NP) swabs in vial transport medium  Result Value Ref Range Status   SARS Coronavirus 2 by RT PCR NEGATIVE NEGATIVE Final    Comment: (NOTE) SARS-CoV-2 target nucleic acids are NOT DETECTED.  The SARS-CoV-2 RNA is generally detectable in upper respiratory specimens during the acute phase of infection. The lowest concentration of SARS-CoV-2 viral copies this assay can detect is 138 copies/mL. A negative result does not preclude SARS-Cov-2 infection and should not be used as the sole basis for treatment or other patient management decisions. A negative result may occur with  improper specimen collection/handling, submission of specimen other than nasopharyngeal swab, presence of viral mutation(s) within the areas targeted by this assay, and inadequate number of viral copies(<138 copies/mL). A negative result must be combined with clinical observations, patient history, and epidemiological information. The expected result is Negative.  Fact Sheet for Patients:  EntrepreneurPulse.com.au  Fact Sheet for Healthcare Providers:  IncredibleEmployment.be  This test is no t yet approved or cleared by the [place] FDA and  has been authorized for detection and/or diagnosis of  SARS-CoV-2 by FDA under an Emergency Use Authorization (EUA). This EUA will remain  in effect (meaning this test can be used) for the duration of the COVID-19 declaration under Section 564(b)(1) of the Act, 21 U.S.C.section 360bbb-3(b)(1), unless the authorization is terminated  or revoked sooner.       Influenza A by PCR NEGATIVE NEGATIVE Final   Influenza B by PCR NEGATIVE NEGATIVE Final    Comment: (NOTE) The Xpert Xpress SARS-CoV-2/FLU/RSV plus assay is intended as an aid in the diagnosis of influenza from Nasopharyngeal swab specimens and should not be used as a sole basis for treatment. Nasal washings and aspirates are unacceptable for Xpert Xpress SARS-CoV-2/FLU/RSV testing.  Fact Sheet for Patients: EntrepreneurPulse.com.au  Fact Sheet for Healthcare Providers: IncredibleEmployment.be  This test is not yet approved or cleared by the [place] FDA and has been authorized for detection and/or diagnosis of SARS-CoV-2 by FDA under an Emergency Use Authorization (EUA). This EUA will remain in effect (meaning this test can be used) for the duration of the COVID-19 declaration under Section 564(b)(1) of the Act, 21 U.S.C. section 360bbb-3(b)(1),  unless the authorization is terminated or revoked.  Performed at [facility], [address] [place], [place] [address]      Radiologic Imaging: Images CT abdomen/pelvis were personally reviewed and interpreted  CT Head Wo Contrast  Result Date: 01/30/2021 CLINICAL DATA:  Nausea/vomiting EXAM: CT HEAD WITHOUT CONTRAST TECHNIQUE: Contiguous axial images were obtained from the base of the skull through the vertex without intravenous contrast. COMPARISON:  CT head 11/15/2018 FINDINGS: Brain: Cerebral ventricle sizes are concordant with the degree of cerebral volume loss. Patchy and confluent areas of decreased attenuation are noted throughout the deep and periventricular white matter  of the cerebral hemispheres bilaterally, compatible with chronic microvascular ischemic disease. No evidence of large-territorial acute infarction. No parenchymal hemorrhage. No mass lesion. No extra-axial collection. No mass effect or midline shift. No hydrocephalus. Basilar cisterns are patent. Vascular: No hyperdense vessel. Atherosclerotic calcifications are present within the cavernous internal carotid arteries. Skull: No acute fracture or focal lesion. Sinuses/Orbits: Paranasal sinuses and mastoid air cells are clear. Bilateral lens replacement. Otherwise orbits are unremarkable. Other: None. IMPRESSION: No acute intracranial abnormality. Electronically Signed   By: Iven Finn M.D.   On: 01/30/2021 17:39   DG Chest Port 1 View  Result Date: 01/30/2021 CLINICAL DATA:  Sepsis. EXAM: PORTABLE CHEST 1 VIEW COMPARISON:  July 14, 2019. FINDINGS: Stable cardiomegaly. Mild central pulmonary vascular congestion is noted. No pneumothorax is noted. Right lung is clear. Mild left basilar atelectasis or infiltrate is noted with probable associated effusion. Bony thorax is unremarkable. IMPRESSION: Mild cardiomegaly with central pulmonary vascular congestion. Mild left basilar atelectasis or infiltrate with probable associated pleural effusion. Aortic Atherosclerosis (ICD10-I70.0). Electronically Signed   By: Marijo Conception M.D.   On: 01/30/2021 17:23   CT CHEST ABDOMEN PELVIS WO CONTRAST  Result Date: 01/30/2021 CLINICAL DATA:  Nausea/vomiting; blood work abnormal EXAM: CT CHEST, ABDOMEN AND PELVIS WITHOUT CONTRAST TECHNIQUE: Multidetector CT imaging of the chest, abdomen and pelvis was performed following the standard protocol without IV contrast. COMPARISON:  June 08, 2019 FINDINGS: Evaluation is limited secondary to lack of IV contrast and arms down positioning. CT CHEST FINDINGS Cardiovascular: Cardiomegaly. Three-vessel coronary artery atherosclerotic calcifications. No pericardial effusion.  Atherosclerotic calcifications of the aorta. Mediastinum/Nodes: Thyroid is unremarkable. No axillary adenopathy. No mediastinal adenopathy. Lungs/Pleura: Evaluation is limited secondary to respiratory motion. Revisualization of perihilar predominant ground-glass opacities, overall mildly improved in comparison to prior. This likely reflects the sequela of prior COVID-19 infection. There is a more focal irregular opacity of the RIGHT lower lobe which measures 17 by 16 mm (series 4, image 94 Musculoskeletal: Thoracolumbar scoliosis. Evaluation for rib fractures limited due to respiratory motion. Status post RIGHT mastectomy. CT ABDOMEN PELVIS FINDINGS Hepatobiliary: The gallbladder is distended. No definitive adjacent pericholecystic fat stranding as adjacent stranding along the inferior posterior aspect is favored to be secondary to renal pathology. Unremarkable noncontrast appearance of the liver. Pancreas: No peripancreatic fat stranding. Spleen: Unremarkable Adrenals/Urinary Tract: Adrenal glands are unremarkable. There is mild to moderate RIGHT-sided hydroureteronephrosis. Evaluation of the pelvis is limited by streak artifact from adjacent hip arthroplasty but there is a 2 mm radiopaque density at the region of the RIGHT UVJ which is favored to reflect an obstructing ureterolithiasis. There is exuberant adjacent fat stranding along the RIGHT kidney. No LEFT-sided hydronephrosis. Bladder is poorly assessed. Stomach/Bowel: No evidence of bowel obstruction. Diverticulosis. No evidence of appendicitis. Vascular/Lymphatic: Atherosclerotic calcifications of the aorta. No suspicious lymphadenopathy. Reproductive: Poor visualization of the pelvis secondary to streak  artifact. Other: No free air. Musculoskeletal: Thoracolumbar scoliosis.  RIGHT hip arthroplasty. IMPRESSION: 1. There is mild to moderate RIGHT-sided hydroureteronephrosis which is favored to be secondary to a 2 mm ureterolithiasis at the RIGHT UVJ. There is  extensive adjacent fat stranding. Recommend correlation with urine analysis to evaluate for superimposed infection. 2. The gallbladder is distended; fat stranding along the inferior posterior margin is favored to be due to renal pathology. Recommend correlation with liver function tests and physical exam. 3. 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 There is a more focal irregular opacity of the RIGHT lower lobe which may reflect infection or atelectasis. Consider follow-up CT in 6 months to assess for resolution. Aortic Atherosclerosis (ICD10-I70.0). Electronically Signed   By: Valentino Saxon MD   On: 01/30/2021 17:58    Impression/Assessment:  87 y.o. female with 24-hour history of altered mental status and sepsis most likely secondary to obstructing right ureteral calculus.  CT with marked right perinephric stranding No other obvious source of sepsis on ED evaluation  Recommendation:  CT findings were discussed in detail with her daughter No I recommended emergent cystoscopy with right ureteral stent placement The procedure was discussed including potential risks of bleeding, ureteral injury and worsening sepsis initially It was stressed no attempt will be made to remove the ureteral calculus due to the likelihood of worsening bacteremia/sepsis  Alternatives were discussed including placement of a percutaneous nephrostomy tube by interventional radiology Antibiotic therapy without intervention was also discussed but not recommended All questions were answered and she has agreed to proceed consent for stent placement   01/30/2021, 8:24 PM  John Giovanni,  MD

## 2021-01-30 NOTE — Anesthesia Preprocedure Evaluation (Signed)
Anesthesia Evaluation  Patient identified by MRN, date of birth, ID band Patient confused  General Assessment Comment: Infected kidney stone, septic. Altered mental status. Vomiting yesterday and today  Reviewed: Allergy & Precautions, NPO status , Patient's Chart, lab work & pertinent test results, reviewed documented beta blocker date and time , Unable to perform ROS - Chart review only  History of Anesthesia Complications Negative for: history of anesthetic complications  Airway Mallampati: II  TM Distance: >3 FB Neck ROM: Full    Dental  (+) Chipped, Poor Dentition   Pulmonary shortness of breath and Long-Term Oxygen Therapy, neg sleep apnea, neg COPD, Patient abstained from smoking.Not current smoker,    breath sounds clear to auscultation       Cardiovascular Exercise Tolerance: Poor METS: 3 - Mets hypertension, Pt. on medications and Pt. on home beta blockers pulmonary hypertension+ CAD and +CHF  (-) Past MI + dysrhythmias Atrial Fibrillation + Valvular Problems/Murmurs MR  Rhythm:Regular Rate:Tachycardia  TTE 2020: 1. Left ventricular ejection fraction, by visual estimation, is 40 to  45%. The left ventricle has moderately decreased function. There is mildly  increased left ventricular hypertrophy.  2. Left ventricular diastolic parameters are consistent with Grade I  diastolic dysfunction (impaired relaxation).  3. Mild to moderately dilated left ventricular internal cavity size.  4. Moderate, fixed thrombus on the lateral wall of the left ventricle.  5. Global right ventricle has mildly reduced systolic function.The right  ventricular size is mildly enlarged. Mildly increased right ventricular  wall thickness.  6. Left atrial size was mild-moderately dilated.  7. Right atrial size was moderately dilated.  8. Trivial pericardial effusion is present.  9. The mitral valve is myxomatous. Moderate to severe mitral  valve  regurgitation.  10. The tricuspid valve is grossly normal. Tricuspid valve regurgitation  mild-moderate.  11. The aortic valve was not well visualized. Aortic valve regurgitation  is not visualized.  12. The pulmonic valve was normal in structure. Pulmonic valve  regurgitation is mild.  13. The aortic root was not well visualized.  14. Moderately elevated pulmonary artery systolic pressure.  15. The interatrial septum was not assessed.    Neuro/Psych negative neurological ROS  negative psych ROS   GI/Hepatic neg GERD  ,(+)     (-) substance abuse  ,   Endo/Other  neg diabetes  Renal/GU negative Renal ROS     Musculoskeletal  (+) Arthritis ,   Abdominal   Peds  Hematology   Anesthesia Other Findings Past Medical History: No date: Atrial fibrillation ([facility]) No date: Heart failure ([facility]) No date: Hyperlipidemia No date: Hypertension No date: Hypokalemia No date: Osteoarthritis   Reproductive/Obstetrics                             Anesthesia Physical  Anesthesia Plan  ASA: 3 and emergent  Anesthesia Plan: General   Post-op Pain Management:    Induction: Intravenous and Rapid sequence  PONV Risk Score and Plan: 4 or greater and Ondansetron, Dexamethasone and Treatment may vary due to age or medical condition  Airway Management Planned: Oral ETT  Additional Equipment: None  Intra-op Plan:   Post-operative Plan: Extubation in OR and Possible Post-op intubation/ventilation  Informed Consent: I have reviewed the patients History and Physical, chart, labs and discussed the procedure including the risks, benefits and alternatives for the proposed anesthesia with the patient or authorized representative who has indicated his/her understanding and acceptance.   Patient  has DNR.  Discussed DNR with power of attorney and Suspend DNR.   Dental advisory given and Consent reviewed with POA  Plan Discussed with: CRNA  Anesthesia  Plan Comments: (Discussed risks of anesthesia with patient's daughter at bedside (due to patient's altered mental status), including PONV, sore throat, lip/dental damage, Post operative cognitive dysfunction. Rare risks discussed as well, such as cardiorespiratory and neurological sequelae. Potential prolonged intubation discussed, and also discussed DNR. DDaughter understands and wishes to suspend perioperatively.)        Anesthesia Quick Evaluation

## 2021-01-30 NOTE — Transfer of Care (Signed)
Immediate Anesthesia Transfer of Care Note  Patient: Selinda Orion  Procedure(s) Performed: CYSTOSCOPY WITH STENT PLACEMENT (Right)  Patient Location: PACU  Anesthesia Type:General  Level of Consciousness: awake and drowsy  Airway & Oxygen Therapy: Patient Spontanous Breathing and Patient connected to face mask oxygen  Post-op Assessment: Report given to RN and Post -op Vital signs reviewed and stable  Post vital signs: Reviewed and stable  Last Vitals:  Vitals Value Taken Time  BP 105/57 01/30/21 2147  Temp    Pulse 97 01/30/21 2151  Resp 26 01/30/21 2151  SpO2 90 % 01/30/21 2151  Vitals shown include unvalidated device data.  Last Pain: There were no vitals filed for this visit.       Complications: No notable events documented.

## 2021-01-30 NOTE — ED Notes (Signed)
Lactic acid 4.0.

## 2021-01-30 NOTE — Progress Notes (Signed)
Pharmacy Antibiotic Note  Judith Keller is a 87 y.o. female with medical history for afib (on Coumadin), heart failure, HLD, HTN, and osteoarthritis admitted on 01/30/2021 with sepsis.  Pharmacy has been consulted for vancomycin dosing.   WBC 38.6; lactic acid 4.0   Plan: Pt received 1g IV vancomycin loading dose in the ED. Given pt current Scr 2.24 with a baseline <1 in Jan 2021 will hold off on maintenance dose for now and reassess tomorrow (7/23) Monitor renal function and adjust dose as clinically indicated Obtain vancomycin levels around 4th or 5th dose if continued  Follow up cultures    Height: '5\' 5"'$  (165.1 cm) Weight: 59 kg (130 lb) IBW/kg (Calculated) : 57  No data recorded.  Recent Labs  Lab 01/30/21 1707  WBC 38.6*  CREATININE 2.24*  LATICACIDVEN 4.0*    Estimated Creatinine Clearance: 13.8 mL/min (A) (by C-G formula based on SCr of 2.24 mg/dL (H)).    No Known Allergies  Antimicrobials this admission: 7/22 vancomycin >> 7/22 cefepime >>   Dose adjustments this admission:   Microbiology results: 7/22 Bcx: sent 7/22 UCx: sent  Thank you for allowing pharmacy to be a part of this patient's care.  Forde Dandy Avril Busser 01/30/2021 8:28 PM

## 2021-01-30 NOTE — Consult Note (Signed)
PHARMACY -  BRIEF ANTIBIOTIC NOTE   Pharmacy has received consult(s) for cefepime and vancomycin from an ED provider.  The patient's profile has been reviewed for ht/wt/allergies/indication/available labs.    One time order(s) placed for --Vancomycin 1 g --Cefepime 2 g  Further antibiotics/pharmacy consults should be ordered by admitting physician if indicated.                       Thank you, Benita Gutter 01/30/2021  4:58 PM

## 2021-01-30 NOTE — Progress Notes (Signed)
CODE SEPSIS - PHARMACY COMMUNICATION  **Broad Spectrum Antibiotics should be administered within 1 hour of Sepsis diagnosis**  Time Code Sepsis Called/Page Received: 1700  Antibiotics Ordered: cefepime & vancomycin  Time of 1st antibiotic administration: 1736  Additional action taken by pharmacy: N/A  Benita Gutter 01/30/2021  5:00 PM

## 2021-01-30 NOTE — Sepsis Progress Note (Signed)
LA is 4, messaged Dr Jacqualine Code with request for more fluid. Pt does have hx of CHF

## 2021-01-30 NOTE — Consult Note (Addendum)
NAME:  Judith Keller, MRN:  [id number], DOB:  1925-01-31, LOS: 0 ADMISSION DATE:  01/30/2021, CONSULTATION DATE:  01/30/2021 REFERRING MD:  Dr. Girard Cooter, CHIEF COMPLAINT:  AMS & Hypotension   History of Present Illness:  87 yo F presenting to [facility] ED on 01/30/21 from [facility] due to change in mental status in the previous 24 hours with corresponding leukocytosis. Per EMS patient vomited 1-2 times prior to arrival to ED. ED course Initial Vitals: RR 17, HR 96, BP 133/85 & SpO2 94% on 2 L [place]. Significant labs: AKI-39/ 2.24, Troponin 117, lactic acidosis 4.0, PCT > 150, leukocytosis 38.6, COVID-19 & Influenza A/B negative. CTH: negative for any intracranial abnormality, CT chest/abdomen/pelvis: mild-moderate R-sided hydroureteronephrosis in the setting of 2 mm ureterolithiasis at the R UVJ.   Patient met sepsis criteria and was resuscitated with 1.5 L of IVF also receiving cefepime & vancomycin for empiric coverage. Urology was consulted and recommended an emergent cystoscopy with right ureteral stent placement. The patient's daughter was consented and patient went to the OR with [facility] as the primary admission team. In PACU the patient became hypotensive and was given an additional 1.1 L of IVF with SBP 70's-80's and MAP in 50's-60's. PCCM consulted for vasopressor management and patient admitted to [facility] unit.  Pertinent  Medical History  HFrEF CAD Hypothyroidism Chronic Atrial Fibrillation HLD HTN Osteoarthritis  Significant Hospital Events: Including procedures, antibiotic start and stop dates in addition to other pertinent events   01/30/21 emergent cystoscopy with stent placement due to urosepsis, post procedure patient hypotension with potential need for vasopressor support > transferred to SDU  Interim History / Subjective:  Patient somnolent in PACU with daughter bedside. Minimally responsive to painful stimuli.  Patient's daughter reports at baseline her mother is A&O x 4, a  "social butterfly" but is fearful of walking on own as she previously had broken her ankle. Explained that PCCM is consulted to initiate vasopressor support and [name] understands and confirms that this would be in line with her mother's wishes. Objective   Blood pressure (!) 88/57, pulse 98, temperature 98.1 F (36.7 C), resp. rate 20, height _0  (1.651 m), weight 59 kg, SpO2 97 %.        Intake/Output Summary (Last 24 hours) at 01/30/2021 2303 Last data filed at 01/30/2021 2200 Gross per 24 hour  Intake 1500.9 ml  Output 25 ml  Net 1475.9 ml   Filed Weights   01/30/21 1650  Weight: 59 kg    Examination: General: Adult female, critically ill, lying in bed, somnolent but NAD HEENT: MM pink/moist, anicteric, atraumatic, neck supple Neuro: opens eyes to pain, unable to follow commands, PERRL +3 CV: s1s2 irregular, controlled A-fib on monitor, no r/m/g Pulm: Regular, non labored on simple mask at 7 L , breath sounds coarse-BUL & diminished-BLL GI: soft, rounded, non tender, bs x 4 GU: foley in place with frank hematuria noted Skin: scattered ecchymosis noted Extremities: warm/dry, pulses + 2 R/P, no edema noted  Resolved Hospital Problem list     Assessment & Plan:  Sepsis with septic shock due to Right distal ureteral calculus s/p cystoscopy & stent placement Lactic: 4.0, Baseline PCT: >150, UA: +Hgb/ +leukocytes/+protein/+MANY bacteria & hyaline casts, CXR: mild pulmonary vascular congestion/ mild L basilar atelectasis vs infiltrate, CT chest/abdomen/pelvis: mild-moderate R-sided hydroureteronephrosis in the setting of 2 mm ureterolithiasis at the R UVJ. Distended gallbladder, focal irregular opacity of the RLL infection vs atelectasis Initial interventions/workup included: 1.5 L of  LR & Cefepime & Vancomycin. Additional 1100 mL of LR given intra/post-op for BP support - f/u cultures, trend lactic/ PCT - Daily CBC - monitor WBC/ fever curve - IV antibiotics: cefepime &  vancomycin  - discontinued continuous IVF as patient received a total of 2.6 L in last 4 hours and has baseline HFrEF>> repeat lactic to decide on further IVF - Initiate vasopressors to maintain MAP< 65, phenylephrine drip 1 st d/t A-fib history - Strict I/O's: alert provider if UOP < 0.5 mL/kg/hr - urology following, appreciate input  Acute hypoxic respiratory failure in the setting of suspected aspiration pneumonia Per ED documentation patient vomited 1-2 times potentially prior to EMS arrival. CT chest shows a focal irregular opacity in the RLL: atelectasis vs infection. Given the patient's altered state I think it is highly possible she might have aspirated causing the hypoxia. - ABG to rule out CO2 retention and possible need for BIPAP > per PACU RN patient has become increasingly somnolent in recovery. - Supplemental oxygen as needed, to maintain SpO2 > 90% - will add Flagyl to Cefepime & Vancomycin for anaerobe coverage  - aggressive pulmonary toileting as tolerated: IS & flutter valve while awake - bronchodilators PRN  Acute Kidney Injury secondary to right distal ureteral calculus Baseline Cr: 0.58, Cr on admission: 2.24 - Strict I/O's: alert provider if UOP < 0.5 mL/kg/hr - gentle IVF hydration > patient received 2.6 L of IVF - Daily BMP, replace electrolytes PRN - Avoid nephrotoxic agents as able, ensure adequate renal perfusion  Elevated Troponin secondary to demand ischemia vs N-STEMI Chronic Atrial Fibrillation - on warfarin outpatient HFrEF PMHx: CAD - restart systemic anticoagulation within 24 hours post procedure > consider heparin drip if patient remains lethargic and troponin continues to climb - trend troponin to peak - diltiazem & lopressor on hold overnight due to hypotension  - Continuous cardiac monitoring  Acute Encephalopathy in the setting of urosepsis with septic shock Per daughter patient A&O x 4 at baseline and became altered 24 hours prior to  hospitalization. CTH negative for any acute abnormality - supportive care -avoid sedating medications as tolerated, especially benzodiazepines - maintain appropriate sleep/wake cycle  Best Practice (right click and "Reselect all SmartList Selections" daily)  Diet/type: NPO DVT prophylaxis: prophylactic heparin  GI prophylaxis: PPI Lines: N/A Foley:  Yes, and it is still needed Code Status:  DNR Last date of multidisciplinary goals of care discussion 01/30/21 Discussed with HPOA daughter, Maxie Better, bedside confirming DNR/DNI and also confirming that if needed vasopressors would be in line with her mother's wishes  Labs   CBC: Recent Labs  Lab 01/30/21 1707  WBC 38.6*  NEUTROABS 34.7*  HGB 13.2  HCT 41.4  MCV 98.3  PLT 063    Basic Metabolic Panel: Recent Labs  Lab 01/30/21 1707  NA 140  K 4.8  CL 105  CO2 24  GLUCOSE 111*  BUN 39*  CREATININE 2.24*  CALCIUM 8.2*   GFR: Estimated Creatinine Clearance: 13.8 mL/min (A) (by C-G formula based on SCr of 2.24 mg/dL (H)). Recent Labs  Lab 01/30/21 1707 01/30/21 1710  PROCALCITON  --  >150.00  WBC 38.6*  --   LATICACIDVEN 4.0*  --     Liver Function Tests: Recent Labs  Lab 01/30/21 1707  AST 55*  ALT 41  ALKPHOS 74  BILITOT 1.0  PROT 6.2*  ALBUMIN 3.6   No results for input(s): LIPASE, AMYLASE in the last 168 hours. No results for input(s): AMMONIA in  the last 168 hours.  ABG No results found for: PHART, PCO2ART, PO2ART, HCO3, TCO2, ACIDBASEDEF, O2SAT   Coagulation Profile: Recent Labs  Lab 01/30/21 1707  INR 2.6*    Cardiac Enzymes: No results for input(s): CKTOTAL, CKMB, CKMBINDEX, TROPONINI in the last 168 hours.  HbA1C: No results found for: HGBA1C  CBG: No results for input(s): GLUCAP in the last 168 hours.  Review of Systems:   Patient unable to participate in interview, somnolent- unable to follow commands.  Past Medical History:  She,  has a past medical history of Atrial  fibrillation ([place]), Heart failure ([place]), Hyperlipidemia, Hypertension, Hypokalemia, and Osteoarthritis.   Surgical History:   Past Surgical History:  Procedure Laterality Date   FOOT SURGERY     HIP ARTHROPLASTY Right 11/16/2018   Procedure: ARTHROPLASTY BIPOLAR HIP (HEMIARTHROPLASTY);  Surgeon: Lovell Sheehan, MD;  Location: [facility] ORS;  Service: Orthopedics;  Laterality: Right;   MASTECTOMY Right      Social History:   reports that she has never smoked. She has never used smokeless tobacco. She reports previous alcohol use. She reports that she does not use drugs.   Family History:  Her family history includes Aneurysm in her father; Vision loss in her mother.   Allergies No Known Allergies   Home Medications  Prior to Admission medications   Medication Sig Start Date End Date Taking? Authorizing Provider  aspirin EC 81 MG tablet Take 81 mg by mouth daily. Swallow whole.   Yes [provider]  cholecalciferol (VITAMIN D) 25 MCG (1000 UT) tablet Take 2,000 Units by mouth daily.   Yes [provider]  citalopram (CELEXA) 10 MG tablet Take 10 mg by mouth daily.   Yes [provider]  famotidine (PEPCID) 20 MG tablet Take 20 mg by mouth 2 (two) times daily.   Yes [provider]  fluticasone (FLONASE) 50 MCG/ACT nasal spray Place 2 sprays into both nostrils at bedtime.   Yes [provider]  furosemide (LASIX) 20 MG tablet Take 1 tablet (20 mg total) by mouth daily. 06/12/19  Yes Nolberto Hanlon, MD  levothyroxine (SYNTHROID) 50 MCG tablet Take 50 mcg by mouth daily before breakfast.   Yes [provider]  Melatonin 3 MG TABS Take 6 mg by mouth at bedtime.   Yes [provider]  metoprolol succinate (TOPROL-XL) 100 MG 24 hr tablet Take 100 mg by mouth daily. Take with or immediately following a meal.   Yes [provider]  Multiple Vitamin (MULTIVITAMIN WITH MINERALS) TABS tablet Take 1 tablet by mouth daily.   Yes  [provider]  mupirocin ointment (BACTROBAN) 2 % Apply 1 application topically 3 (three) times daily. 01/27/21  Yes [provider]  nystatin cream (MYCOSTATIN) Apply 1 application topically 2 (two) times daily.   Yes [provider]  potassium chloride (KLOR-CON) 10 MEQ tablet Take 1 tablet (10 mEq total) by mouth daily. 07/17/19  Yes Thornell Mule, MD  predniSONE (DELTASONE) 5 MG tablet Take 5 mg by mouth daily with breakfast.   Yes [provider]  warfarin (COUMADIN) 1 MG tablet Take 1 mg by mouth daily. Take along with one 2.5 mg tablet for total 3.5 mg once daily   Yes [provider]  warfarin (COUMADIN) 2.5 MG tablet Take 2.5 mg by mouth every evening.   Yes [provider]  acetaminophen (TYLENOL) 325 MG tablet Take 650 mg by mouth every 6 (six) hours as needed for mild pain.  [provider]  albuterol (VENTOLIN HFA) 108 (90 Base) MCG/ACT inhaler Inhale 2 puffs into the lungs every 4 (four) hours as needed for wheezing or shortness of breath.     [provider]  dextromethorphan-guaiFENesin (MUCINEX DM) 30-600 MG 12hr tablet Take 1 tablet by mouth 2 (two) times daily. 07/17/19   Thornell Mule, MD  diltiazem (DILACOR XR) 120 MG 24 hr capsule Take 120 mg by mouth daily. Patient not taking: Reported on 01/30/2021    [provider]  ipratropium (ATROVENT HFA) 17 MCG/ACT inhaler Inhale 2 puffs into the lungs every 4 (four) hours. 07/17/19   Thornell Mule, MD  lisinopril (ZESTRIL) 2.5 MG tablet Take 1 tablet (2.5 mg total) by mouth daily. Patient not taking: No sig reported 07/18/19   Thornell Mule, MD  ondansetron (ZOFRAN) 4 MG tablet Take 4 mg by mouth every 8 (eight) hours as needed for nausea/vomiting. 01/30/21   [provider]  polyethylene glycol (MIRALAX / GLYCOLAX) 17 g packet Take 17 g by mouth daily.    [provider]  simvastatin (ZOCOR) 20 MG tablet Take 20 mg by mouth at bedtime.   Patient not taking: Reported on 01/30/2021    [provider]  traMADol (ULTRAM) 50 MG tablet Take 1 tablet (50 mg total) by mouth every 6 (six) hours as needed for moderate pain. Patient not taking: Reported on 01/30/2021 11/18/18   Nicholes Mango, MD  vitamin C (ASCORBIC ACID) 250 MG tablet Take 500 mg by mouth daily. Patient not taking: Reported on 01/30/2021    [provider]     Critical care time: 44 minutes      Venetia Night, AGACNP-BC Acute Care Nurse Practitioner [place]   [phone] / [phone] Please see Amion for pager details.

## 2021-01-30 NOTE — ED Provider Notes (Signed)
[facility] Emergency Department Provider Note   ____________________________________________   Event Date/Time   First MD Initiated Contact with Patient 01/30/21 1653     (approximate)  I have reviewed the triage vital signs and the nursing notes.   HISTORY  Chief Complaint Altered Mental Status  I discussed in some history obtained from the patient's current healthcare power of attorney her daughter Judith Keller.  Judith Keller advises the patient's previous was her brother who has since passed away.  She does affirm the patient's status is DNR, but does agree that patient may certainly receive anything to relieve discomfort, treat to keep comfortable, but also agreeable with receiving IV fluids antibiotics as needed.  DNR status is also affirmed on the patient's MOST form  HPI Judith Keller is a 87 y.o. female who normally is alert and well oriented but today was noted to be confused.  There was some potential concern for infection and blood work was obtained at her nursing facility that showed a severely elevated white count of 24,000.  Patient transferred for further care and work-up.  Patient unable to provide additional history.  The patient denies being in pain denies discomfort, but does report port "someone please take care of my birds" she reports the burns are located outside.  Patient's daughter affirms she has birdfeeders at her window at her care facility that she is likely referencing but has obviously been confused  EMS reports the patient is noted to have some episode of emesis and vomited once or 2 potentially prior to the arrival  Past Medical History:  Diagnosis Date   Atrial fibrillation ([place])    Heart failure ([place])    Hyperlipidemia    Hypertension    Hypokalemia    Osteoarthritis     Patient Active Problem List   Diagnosis Date Noted   Severe sepsis ([place]) 01/30/2021   HLD (hyperlipidemia) 07/14/2019   Hypothermia 07/14/2019    Leucocytosis 07/14/2019   COVID-19 virus infection 07/14/2019   Acute on chronic respiratory failure with hypoxia ([place]) 07/14/2019   Pressure injury of skin 06/09/2019   CHF (congestive heart failure) ([place]) 06/09/2019   Acute on chronic combined systolic and diastolic CHF (congestive heart failure) ([place]) 06/08/2019   Acute respiratory distress 06/08/2019   Hypoxia 06/08/2019   Elevated brain natriuretic peptide (BNP) level 06/08/2019   Atrial fibrillation, chronic ([place]) 06/08/2019   Polymyalgia rheumatica ([place]) 06/08/2019   CAD (coronary artery disease) 06/08/2019   S/P right hip fracture 11/15/2018    Past Surgical History:  Procedure Laterality Date   FOOT SURGERY     HIP ARTHROPLASTY Right 11/16/2018   Procedure: ARTHROPLASTY BIPOLAR HIP (HEMIARTHROPLASTY);  Surgeon: Lovell Sheehan, MD;  Location: [facility] ORS;  Service: Orthopedics;  Laterality: Right;   MASTECTOMY      Prior to Admission medications   Medication Sig Start Date End Date Taking? Authorizing Provider  aspirin EC 81 MG tablet Take 81 mg by mouth daily. Swallow whole.   Yes [provider]  cholecalciferol (VITAMIN D) 25 MCG (1000 UT) tablet Take 2,000 Units by mouth daily.   Yes [provider]  citalopram (CELEXA) 10 MG tablet Take 10 mg by mouth daily.   Yes [provider]  famotidine (PEPCID) 20 MG tablet Take 20 mg by mouth 2 (two) times daily.   Yes [provider]  fluticasone (FLONASE) 50 MCG/ACT nasal spray Place 2 sprays into both nostrils at bedtime.   Yes [provider]  furosemide (LASIX) 20 MG tablet Take 1 tablet (20 mg total) by mouth daily. 06/12/19  Yes Nolberto Hanlon, MD  levothyroxine (SYNTHROID) 50 MCG tablet Take 50 mcg by mouth daily before breakfast.   Yes [provider]  Melatonin 3 MG TABS Take 6 mg by mouth at bedtime.   Yes [provider]  metoprolol succinate (TOPROL-XL) 100 MG 24 hr tablet Take 100 mg by mouth daily. Take with or  immediately following a meal.   Yes [provider]  Multiple Vitamin (MULTIVITAMIN WITH MINERALS) TABS tablet Take 1 tablet by mouth daily.   Yes [provider]  mupirocin ointment (BACTROBAN) 2 % Apply 1 application topically 3 (three) times daily. 01/27/21  Yes [provider]  nystatin cream (MYCOSTATIN) Apply 1 application topically 2 (two) times daily.   Yes [provider]  potassium chloride (KLOR-CON) 10 MEQ tablet Take 1 tablet (10 mEq total) by mouth daily. 07/17/19  Yes Thornell Mule, MD  predniSONE (DELTASONE) 5 MG tablet Take 5 mg by mouth daily with breakfast.   Yes [provider]  warfarin (COUMADIN) 1 MG tablet Take 1 mg by mouth daily. Take along with one 2.5 mg tablet for total 3.5 mg once daily   Yes [provider]  warfarin (COUMADIN) 2.5 MG tablet Take 2.5 mg by mouth every evening.   Yes [provider]  acetaminophen (TYLENOL) 325 MG tablet Take 650 mg by mouth every 6 (six) hours as needed for mild pain.    [provider]  albuterol (VENTOLIN HFA) 108 (90 Base) MCG/ACT inhaler Inhale 2 puffs into the lungs every 4 (four) hours as needed for wheezing or shortness of breath.     [provider]  dextromethorphan-guaiFENesin (MUCINEX DM) 30-600 MG 12hr tablet Take 1 tablet by mouth 2 (two) times daily. 07/17/19   Thornell Mule, MD  diltiazem (DILACOR XR) 120 MG 24 hr capsule Take 120 mg by mouth daily. Patient not taking: Reported on 01/30/2021    [provider]  ipratropium (ATROVENT HFA) 17 MCG/ACT inhaler Inhale 2 puffs into the lungs every 4 (four) hours. 07/17/19   Thornell Mule, MD  lisinopril (ZESTRIL) 2.5 MG tablet Take 1 tablet (2.5 mg total) by mouth daily. Patient not taking: No sig reported 07/18/19   Thornell Mule, MD  ondansetron (ZOFRAN) 4 MG tablet Take 4 mg by mouth every 8 (eight) hours as needed for nausea/vomiting. 01/30/21   [provider]  polyethylene glycol  (MIRALAX / GLYCOLAX) 17 g packet Take 17 g by mouth daily.    [provider]  simvastatin (ZOCOR) 20 MG tablet Take 20 mg by mouth at bedtime.  Patient not taking: Reported on 01/30/2021    [provider]  traMADol (ULTRAM) 50 MG tablet Take 1 tablet (50 mg total) by mouth every 6 (six) hours as needed for moderate pain. Patient not taking: Reported on 01/30/2021 11/18/18   Nicholes Mango, MD  vitamin C (ASCORBIC ACID) 250 MG tablet Take 500 mg by mouth daily. Patient not taking: Reported on 01/30/2021    [provider]    Allergies Patient has no known allergies.  History reviewed. No pertinent family history.  Social History Social History   Tobacco Use   Smoking status: Never   Smokeless tobacco: Never  Vaping Use   Vaping Use: Never used  Substance Use Topics   Alcohol use: Not Currently   Drug use: Never    Review of Systems  EM caveat.  Though felt quite unreliable due to altered mental status the patient does deny any pain difficulty breathing or discomfort.   ____________________________________________   PHYSICAL EXAM:  VITAL SIGNS: ED Triage Vitals  Enc Vitals Group     BP 01/30/21 1644 133/85     Pulse Rate 01/30/21 1644 96     Resp 01/30/21 1644 17     Temp --      Temp src --      SpO2 01/30/21 1644 94 %     Weight 01/30/21 1650 130 lb (59 kg)     Height 01/30/21 1650 '5\' 5"'$  (1.651 m)     Head Circumference --      Peak Flow --      Pain Score --      Pain Loc --      Pain Edu? --      Excl. in [place]? --     Constitutional: Alert and disoriented, not somnolent is fully alert but confused.  Appears somewhat delirious picking at things discussing birds outside the window and need for taking care of her birds Eyes: Conjunctivae are normal. Head: Atraumatic. Nose: No congestion/rhinnorhea. Mouth/Throat: Mucous membranes are slightly dry. Neck: No stridor.  Cardiovascular: Normal rate, regular rhythm. Grossly normal heart  sounds.  Good peripheral circulation. Respiratory: Mild tachypnea.  No noted rales wheezing rhonchi, but does have mild accessory muscle use and appears slightly dyspneic. Gastrointestinal: Soft and nontender. No distention.  Soft reducible nontender obvious anterior ventral hernia. Musculoskeletal: No lower extremity tenderness nor edema except for a very shallow ulcer noted over the lower right medial tibia approximately size of a dime that has been previously bandaged 2 days ago at her care facility, no obvious surrounding cellulitis or purulence from it. Neurologic: Does not speak much but when she does her speech is clear.  She moves all extremities but seems quite weak in her lower extremities bilateral.  There is no obvious deficits though skin:  Skin is warm, dry and intact. No rash noted. Psychiatric: Mood and affect are confused but not acutely agitated.  Occasionally picks at things.  Conversation nonsensical at times  ____________________________________________   LABS (all labs ordered are listed, but only abnormal results are displayed)  Labs Reviewed  LACTIC ACID, PLASMA - Abnormal; Notable for the following components:      Result Value   Lactic Acid, Venous 4.0 (*)    All other components within normal limits  COMPREHENSIVE METABOLIC PANEL - Abnormal; Notable for the following components:   Glucose, Bld 111 (*)    BUN 39 (*)    Creatinine, Ser 2.24 (*)    Calcium 8.2 (*)    Total Protein 6.2 (*)    AST 55 (*)    GFR, Estimated 20 (*)    All other components within normal limits  CBC WITH DIFFERENTIAL/PLATELET - Abnormal; Notable for the following components:   WBC 38.6 (*)    Neutro Abs 34.7 (*)    Lymphs Abs 0.6 (*)    Monocytes Absolute 1.5 (*)    Abs Immature Granulocytes 1.71 (*)    All other components within normal limits  PROTIME-INR - Abnormal; Notable for the following components:   Prothrombin Time 27.9 (*)    INR 2.6 (*)    All other components within  normal limits  APTT - Abnormal; Notable for the following components:   aPTT 45 (*)    All other components within normal limits  TROPONIN I (HIGH SENSITIVITY) - Abnormal;  Notable for the following components:   Troponin I (High Sensitivity) 117 (*)    All other components within normal limits  RESP PANEL BY RT-PCR (FLU A&B, COVID) ARPGX2  CULTURE, BLOOD (ROUTINE X 2)  CULTURE, BLOOD (ROUTINE X 2)  URINE CULTURE  LACTIC ACID, PLASMA  URINALYSIS, COMPLETE (UACMP) WITH MICROSCOPIC  PROCALCITONIN   ____________________________________________  EKG  Reviewed interpreted by me at 1800 Heart rate 95 QRS 100 QTc 470 A little hard to determine rhythm, suspect possibly sinus with occasional PAC versus A. fib rate controlled.  No evidence of acute ischemia denoted ____________________________________________  RADIOLOGY  CT Head Wo Contrast  Result Date: 01/30/2021 CLINICAL DATA:  Nausea/vomiting EXAM: CT HEAD WITHOUT CONTRAST TECHNIQUE: Contiguous axial images were obtained from the base of the skull through the vertex without intravenous contrast. COMPARISON:  CT head 11/15/2018 FINDINGS: Brain: Cerebral ventricle sizes are concordant with the degree of cerebral volume loss. Patchy and confluent areas of decreased attenuation are noted throughout the deep and periventricular white matter of the cerebral hemispheres bilaterally, compatible with chronic microvascular ischemic disease. No evidence of large-territorial acute infarction. No parenchymal hemorrhage. No mass lesion. No extra-axial collection. No mass effect or midline shift. No hydrocephalus. Basilar cisterns are patent. Vascular: No hyperdense vessel. Atherosclerotic calcifications are present within the cavernous internal carotid arteries. Skull: No acute fracture or focal lesion. Sinuses/Orbits: Paranasal sinuses and mastoid air cells are clear. Bilateral lens replacement. Otherwise orbits are unremarkable. Other: None. IMPRESSION:  No acute intracranial abnormality. Electronically Signed   By: Iven Finn M.D.   On: 01/30/2021 17:39   DG Chest Port 1 View  Result Date: 01/30/2021 CLINICAL DATA:  Sepsis. EXAM: PORTABLE CHEST 1 VIEW COMPARISON:  July 14, 2019. FINDINGS: Stable cardiomegaly. Mild central pulmonary vascular congestion is noted. No pneumothorax is noted. Right lung is clear. Mild left basilar atelectasis or infiltrate is noted with probable associated effusion. Bony thorax is unremarkable. IMPRESSION: Mild cardiomegaly with central pulmonary vascular congestion. Mild left basilar atelectasis or infiltrate with probable associated pleural effusion. Aortic Atherosclerosis (ICD10-I70.0). Electronically Signed   By: Marijo Conception M.D.   On: 01/30/2021 17:23   CT CHEST ABDOMEN PELVIS WO CONTRAST  Result Date: 01/30/2021 CLINICAL DATA:  Nausea/vomiting; blood work abnormal EXAM: CT CHEST, ABDOMEN AND PELVIS WITHOUT CONTRAST TECHNIQUE: Multidetector CT imaging of the chest, abdomen and pelvis was performed following the standard protocol without IV contrast. COMPARISON:  June 08, 2019 FINDINGS: Evaluation is limited secondary to lack of IV contrast and arms down positioning. CT CHEST FINDINGS Cardiovascular: Cardiomegaly. Three-vessel coronary artery atherosclerotic calcifications. No pericardial effusion. Atherosclerotic calcifications of the aorta. Mediastinum/Nodes: Thyroid is unremarkable. No axillary adenopathy. No mediastinal adenopathy. Lungs/Pleura: Evaluation is limited secondary to respiratory motion. Revisualization of perihilar predominant ground-glass opacities, overall mildly improved in comparison to prior. This likely reflects the sequela of prior COVID-19 infection. There is a more focal irregular opacity of the RIGHT lower lobe which measures 17 by 16 mm (series 4, image 94 Musculoskeletal: Thoracolumbar scoliosis. Evaluation for rib fractures limited due to respiratory motion. Status post RIGHT  mastectomy. CT ABDOMEN PELVIS FINDINGS Hepatobiliary: The gallbladder is distended. No definitive adjacent pericholecystic fat stranding as adjacent stranding along the inferior posterior aspect is favored to be secondary to renal pathology. Unremarkable noncontrast appearance of the liver. Pancreas: No peripancreatic fat stranding. Spleen: Unremarkable Adrenals/Urinary Tract: Adrenal glands are unremarkable. There is mild to moderate RIGHT-sided hydroureteronephrosis. Evaluation of the pelvis is limited by streak artifact from adjacent hip arthroplasty  but there is a 2 mm radiopaque density at the region of the RIGHT UVJ which is favored to reflect an obstructing ureterolithiasis. There is exuberant adjacent fat stranding along the RIGHT kidney. No LEFT-sided hydronephrosis. Bladder is poorly assessed. Stomach/Bowel: No evidence of bowel obstruction. Diverticulosis. No evidence of appendicitis. Vascular/Lymphatic: Atherosclerotic calcifications of the aorta. No suspicious lymphadenopathy. Reproductive: Poor visualization of the pelvis secondary to streak artifact. Other: No free air. Musculoskeletal: Thoracolumbar scoliosis.  RIGHT hip arthroplasty. IMPRESSION: 1. There is mild to moderate RIGHT-sided hydroureteronephrosis which is favored to be secondary to a 2 mm ureterolithiasis at the RIGHT UVJ. There is extensive adjacent fat stranding. Recommend correlation with urine analysis to evaluate for superimposed infection. 2. The gallbladder is distended; fat stranding along the inferior posterior margin is favored to be due to renal pathology. Recommend correlation with liver function tests and physical exam. 3. 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 There is a more focal irregular opacity of the RIGHT lower lobe which may reflect infection or atelectasis. Consider follow-up CT in 6 months to assess for resolution. Aortic Atherosclerosis (ICD10-I70.0). Electronically Signed   By: Valentino Saxon MD    On: 01/30/2021 17:58    Imaging reviewed as above bolded as noted for acute findings.  ____________________________________________   PROCEDURES  Procedure(s) performed: None  Procedures  Critical Care performed: Yes, see critical care note(s)  CRITICAL CARE Performed by: Delman Kitten   Total critical care time: 35 minutes  Critical care time was exclusive of separately billable procedures and treating other patients.  Critical care was necessary to treat or prevent imminent or life-threatening deterioration.  Critical care was time spent personally by me on the following activities: development of treatment plan with patient and/or surrogate as well as nursing, discussions with consultants, evaluation of patient's response to treatment, examination of patient, obtaining history from patient or surrogate, ordering and performing treatments and interventions, ordering and review of laboratory studies, ordering and review of radiographic studies, pulse oximetry and re-evaluation of patient's condition.  ____________________________________________   INITIAL IMPRESSION / ASSESSMENT AND PLAN / ED COURSE  Pertinent labs & imaging results that were available during my care of the patient were reviewed by me and considered in my medical decision making (see chart for details).   Patient presents for altered mental status, occurring sometime in the last 24 hours was seen normal yesterday per her daughter and normally is well oriented and conversant.  They noticed significant change today at her care facility and also noted elevated white count and concern for possible infection as well as episodes of vomiting  Differential diagnosis extremely broad for this presentation, will initiate broad work-up including metabolic infectious etc.  No obvious source of infection by clinical exam except she does have mild hypoxia the lung sounds are clear.  Potential for pneumonia also considered.  Based  on blood work from her nursing facility demonstrating severe leukocytosis with concern for possible infection will initiate broad-spectrum antibiotic as we exclude sepsis.  In addition plan to obtain CT of the head for altered mental status, due to mild hypoxia will obtain CT of the chest as well as abdomen pelvis in setting of the patient's reported vomiting as well to rule out other obvious sources.  Right lower extremity may be a potential source with a small ulcer but it does not appear to have purulence or obvious surrounding drainage or cellulitic change.  No other lesions noted.   Clinical Course as of 01/30/21 1948  Fri  Jan 30, 2021  1847 CT reviewed, Urology paged [MQ]  1923 Admit accepted to hospitalist by Dr. Posey Pronto. Dr. Bernardo Heater (urology) also consulting.  [MQ]    Clinical Course User Index [MQ] Delman Kitten, MD   ----------------------------------------- 7:47 PM on 01/30/2021 ----------------------------------------- Patient remains very minimally hypotensive.  Slight tachycardia slight tachypnea.  Evidence of severe sepsis by exam, does not meet septic shock requirements at this time having not had 2 episodes of hypotension or lactic acid greater than 4.  Lactate is equal to 4.0 at this time  Reassessment, patient blood pressure and hemodynamics remain consistent with sepsis, but no evidence of worsening at this time.  We have communicated with Dr. Bernardo Heater of urology who is coming to consult, nursing working to obtain urinalysis as I suspect source may be urosepsis, urinary tract infection, possibly infected kidney stone.  Further work-up on the hospitalist service with anticipation of urology consult this evening  ____________________________________________   FINAL CLINICAL IMPRESSION(S) / ED DIAGNOSES  Final diagnoses:  AKI (acute kidney injury) ([place])  Kidney stone on right side  Severe sepsis [facility])        Note:  This document was prepared using Dragon voice recognition  software and may include unintentional dictation errors       Delman Kitten, MD 01/30/21 1948

## 2021-01-30 NOTE — H&P (Addendum)
History and Physical    Judith Keller [id number] DOB: 09-17-1924 DOA: 01/30/2021  PCP: Harlow Ohms, MD    Patient coming from:  S[facility] [place]    Chief Complaint:  Unresponsive to treatment.    HPI: Judith Keller is a 87 y.o. female seen in ed for AMS. Pt at baseline is A/O and this confusion has been going on for about 24 hour and is new.  Also at facility pt was noted to have high white count and was unresponsive to treatment .Daughter is HCPOA and pt has most form and is DNR.HPI is limited due to AMS.PT noted to have vomited twice prior to coming to ER.  Pt has past medical history of HLD, Covid-19, CHF, A.fib, CAD, PMR, CAD.  ED Course:  Vitals:   01/30/21 1743 01/30/21 1819 01/30/21 1900 01/30/21 1930  BP: (!) 97/47 (!) 99/44 (!) 104/52 (!) 111/53  Pulse: 96 98 (!) 106 100  Resp: (!) 26 (!) 26 (!) 31 (!) 27  SpO2: 98% 100% 94% 97%  Weight:      Height:      In ed pt is alert,awake disoriented and cooperative.Hypotensive and oxygenating ok at baseline.  Blood work shows acute kidney injury with a creatinine of 2.24 and a GFR 20, ALT of 41 and AST of 55.  Initial troponin is 117, lactic acid of 4.0, CBC shows 38.6, hemoglobin of 13.2.   Review of Systems:  Review of Systems  Unable to perform ROS: Mental status change    Past Medical History:  Diagnosis Date   Atrial fibrillation ([place])    Heart failure ([place])    Hyperlipidemia    Hypertension    Hypokalemia    Osteoarthritis     Past Surgical History:  Procedure Laterality Date   FOOT SURGERY     HIP ARTHROPLASTY Right 11/16/2018   Procedure: ARTHROPLASTY BIPOLAR HIP (HEMIARTHROPLASTY);  Surgeon: Lovell Sheehan, MD;  Location: [facility] ORS;  Service: Orthopedics;  Laterality: Right;   MASTECTOMY       reports that she has never smoked. She has never used smokeless tobacco. She reports previous alcohol use. She reports that she does not use drugs.  No Known Allergies  Family History  Problem  Relation Age of Onset   Vision loss Mother    Aneurysm Father     Prior to Admission medications   Medication Sig Start Date End Date Taking? Authorizing Provider  aspirin EC 81 MG tablet Take 81 mg by mouth daily. Swallow whole.   Yes [provider]  cholecalciferol (VITAMIN D) 25 MCG (1000 UT) tablet Take 2,000 Units by mouth daily.   Yes [provider]  citalopram (CELEXA) 10 MG tablet Take 10 mg by mouth daily.   Yes [provider]  famotidine (PEPCID) 20 MG tablet Take 20 mg by mouth 2 (two) times daily.   Yes [provider]  fluticasone (FLONASE) 50 MCG/ACT nasal spray Place 2 sprays into both nostrils at bedtime.   Yes [provider]  furosemide (LASIX) 20 MG tablet Take 1 tablet (20 mg total) by mouth daily. 06/12/19  Yes Nolberto Hanlon, MD  levothyroxine (SYNTHROID) 50 MCG tablet Take 50 mcg by mouth daily before breakfast.   Yes [provider]  Melatonin 3 MG TABS Take 6 mg by mouth at bedtime.   Yes [provider]  metoprolol succinate (TOPROL-XL) 100 MG 24 hr tablet Take 100 mg by mouth daily. Take with  or immediately following a meal.   Yes [provider]  Multiple Vitamin (MULTIVITAMIN WITH MINERALS) TABS tablet Take 1 tablet by mouth daily.   Yes [provider]  mupirocin ointment (BACTROBAN) 2 % Apply 1 application topically 3 (three) times daily. 01/27/21  Yes [provider]  nystatin cream (MYCOSTATIN) Apply 1 application topically 2 (two) times daily.   Yes [provider]  potassium chloride (KLOR-CON) 10 MEQ tablet Take 1 tablet (10 mEq total) by mouth daily. 07/17/19  Yes Thornell Mule, MD  predniSONE (DELTASONE) 5 MG tablet Take 5 mg by mouth daily with breakfast.   Yes [provider]  warfarin (COUMADIN) 1 MG tablet Take 1 mg by mouth daily. Take along with one 2.5 mg tablet for total 3.5 mg once daily   Yes [provider]  warfarin (COUMADIN) 2.5  MG tablet Take 2.5 mg by mouth every evening.   Yes [provider]  acetaminophen (TYLENOL) 325 MG tablet Take 650 mg by mouth every 6 (six) hours as needed for mild pain.    [provider]  albuterol (VENTOLIN HFA) 108 (90 Base) MCG/ACT inhaler Inhale 2 puffs into the lungs every 4 (four) hours as needed for wheezing or shortness of breath.     [provider]  dextromethorphan-guaiFENesin (MUCINEX DM) 30-600 MG 12hr tablet Take 1 tablet by mouth 2 (two) times daily. 07/17/19   Thornell Mule, MD  diltiazem (DILACOR XR) 120 MG 24 hr capsule Take 120 mg by mouth daily. Patient not taking: Reported on 01/30/2021    [provider]  ipratropium (ATROVENT HFA) 17 MCG/ACT inhaler Inhale 2 puffs into the lungs every 4 (four) hours. 07/17/19   Thornell Mule, MD  lisinopril (ZESTRIL) 2.5 MG tablet Take 1 tablet (2.5 mg total) by mouth daily. Patient not taking: No sig reported 07/18/19   Thornell Mule, MD  ondansetron (ZOFRAN) 4 MG tablet Take 4 mg by mouth every 8 (eight) hours as needed for nausea/vomiting. 01/30/21   [provider]  polyethylene glycol (MIRALAX / GLYCOLAX) 17 g packet Take 17 g by mouth daily.    [provider]  simvastatin (ZOCOR) 20 MG tablet Take 20 mg by mouth at bedtime.  Patient not taking: Reported on 01/30/2021    [provider]  traMADol (ULTRAM) 50 MG tablet Take 1 tablet (50 mg total) by mouth every 6 (six) hours as needed for moderate pain. Patient not taking: Reported on 01/30/2021 11/18/18   Nicholes Mango, MD  vitamin C (ASCORBIC ACID) 250 MG tablet Take 500 mg by mouth daily. Patient not taking: Reported on 01/30/2021    [provider]    Physical Exam: Vitals:   01/30/21 1743 01/30/21 1819 01/30/21 1900 01/30/21 1930  BP: (!) 97/47 (!) 99/44 (!) 104/52 (!) 111/53  Pulse: 96 98 (!) 106 100  Resp: (!) 26 (!) 26 (!) 31 (!) 27  SpO2: 98% 100% 94% 97%  Weight:      Height:       Physical  Exam Vitals reviewed.  Constitutional:      Appearance: She is ill-appearing.  HENT:     Right Ear: External ear normal.     Left Ear: External ear normal.     Mouth/Throat:     Mouth: Mucous membranes are dry.  Eyes:     Extraocular Movements: Extraocular movements intact.     Pupils: Pupils are equal, round, and reactive to light.  Cardiovascular:     Rate and  Rhythm: Normal rate. Rhythm irregular.     Pulses: Normal pulses.     Heart sounds: Normal heart sounds.  Pulmonary:     Effort: Pulmonary effort is normal.     Breath sounds: Normal breath sounds.  Abdominal:     General: Bowel sounds are normal. There is no distension.     Palpations: Abdomen is soft.     Tenderness: There is no abdominal tenderness. There is no guarding.  Musculoskeletal:     Right lower leg: No edema.     Left lower leg: No edema.  Skin:    General: Skin is warm.  Neurological:     General: No focal deficit present.     Mental Status: She is oriented to person, place, and time and easily aroused.  Psychiatric:        Mood and Affect: Mood normal.        Behavior: Behavior normal.     Labs on Admission: I have personally reviewed following labs and imaging studies  No results for input(s): CKTOTAL, CKMB, TROPONINI in the last 72 hours. Lab Results  Component Value Date   WBC 38.6 (H) 01/30/2021   HGB 13.2 01/30/2021   HCT 41.4 01/30/2021   MCV 98.3 01/30/2021   PLT 229 01/30/2021    Recent Labs  Lab 01/30/21 1707  NA 140  K 4.8  CL 105  CO2 24  BUN 39*  CREATININE 2.24*  CALCIUM 8.2*  PROT 6.2*  BILITOT 1.0  ALKPHOS 74  ALT 41  AST 55*  GLUCOSE 111*    COVID-19 Labs  Lab Results  Component Value Date   SARSCOV2NAA NEGATIVE 01/30/2021   SARSCOV2NAA NEGATIVE 07/16/2019   SARSCOV2NAA POSITIVE (A) 06/09/2019   [place] NEGATIVE 11/15/2018    Radiological Exams on Admission: CT Head Wo Contrast  Result Date: 01/30/2021 CLINICAL DATA:  Nausea/vomiting EXAM: CT HEAD  WITHOUT CONTRAST TECHNIQUE: Contiguous axial images were obtained from the base of the skull through the vertex without intravenous contrast. COMPARISON:  CT head 11/15/2018 FINDINGS: Brain: Cerebral ventricle sizes are concordant with the degree of cerebral volume loss. Patchy and confluent areas of decreased attenuation are noted throughout the deep and periventricular white matter of the cerebral hemispheres bilaterally, compatible with chronic microvascular ischemic disease. No evidence of large-territorial acute infarction. No parenchymal hemorrhage. No mass lesion. No extra-axial collection. No mass effect or midline shift. No hydrocephalus. Basilar cisterns are patent. Vascular: No hyperdense vessel. Atherosclerotic calcifications are present within the cavernous internal carotid arteries. Skull: No acute fracture or focal lesion. Sinuses/Orbits: Paranasal sinuses and mastoid air cells are clear. Bilateral lens replacement. Otherwise orbits are unremarkable. Other: None. IMPRESSION: No acute intracranial abnormality. Electronically Signed   By: Iven Finn M.D.   On: 01/30/2021 17:39   DG Chest Port 1 View  Result Date: 01/30/2021 CLINICAL DATA:  Sepsis. EXAM: PORTABLE CHEST 1 VIEW COMPARISON:  July 14, 2019. FINDINGS: Stable cardiomegaly. Mild central pulmonary vascular congestion is noted. No pneumothorax is noted. Right lung is clear. Mild left basilar atelectasis or infiltrate is noted with probable associated effusion. Bony thorax is unremarkable. IMPRESSION: Mild cardiomegaly with central pulmonary vascular congestion. Mild left basilar atelectasis or infiltrate with probable associated pleural effusion. Aortic Atherosclerosis (ICD10-I70.0). Electronically Signed   By: Marijo Conception M.D.   On: 01/30/2021 17:23   CT CHEST ABDOMEN PELVIS WO CONTRAST  Result Date: 01/30/2021 CLINICAL DATA:  Nausea/vomiting; blood work abnormal EXAM: CT CHEST, ABDOMEN AND PELVIS WITHOUT CONTRAST TECHNIQUE:  Multidetector CT imaging of the chest, abdomen and pelvis was performed following the standard protocol without IV contrast. COMPARISON:  June 08, 2019 FINDINGS: Evaluation is limited secondary to lack of IV contrast and arms down positioning. CT CHEST FINDINGS Cardiovascular: Cardiomegaly. Three-vessel coronary artery atherosclerotic calcifications. No pericardial effusion. Atherosclerotic calcifications of the aorta. Mediastinum/Nodes: Thyroid is unremarkable. No axillary adenopathy. No mediastinal adenopathy. Lungs/Pleura: Evaluation is limited secondary to respiratory motion. Revisualization of perihilar predominant ground-glass opacities, overall mildly improved in comparison to prior. This likely reflects the sequela of prior COVID-19 infection. There is a more focal irregular opacity of the RIGHT lower lobe which measures 17 by 16 mm (series 4, image 94 Musculoskeletal: Thoracolumbar scoliosis. Evaluation for rib fractures limited due to respiratory motion. Status post RIGHT mastectomy. CT ABDOMEN PELVIS FINDINGS Hepatobiliary: The gallbladder is distended. No definitive adjacent pericholecystic fat stranding as adjacent stranding along the inferior posterior aspect is favored to be secondary to renal pathology. Unremarkable noncontrast appearance of the liver. Pancreas: No peripancreatic fat stranding. Spleen: Unremarkable Adrenals/Urinary Tract: Adrenal glands are unremarkable. There is mild to moderate RIGHT-sided hydroureteronephrosis. Evaluation of the pelvis is limited by streak artifact from adjacent hip arthroplasty but there is a 2 mm radiopaque density at the region of the RIGHT UVJ which is favored to reflect an obstructing ureterolithiasis. There is exuberant adjacent fat stranding along the RIGHT kidney. No LEFT-sided hydronephrosis. Bladder is poorly assessed. Stomach/Bowel: No evidence of bowel obstruction. Diverticulosis. No evidence of appendicitis. Vascular/Lymphatic: Atherosclerotic  calcifications of the aorta. No suspicious lymphadenopathy. Reproductive: Poor visualization of the pelvis secondary to streak artifact. Other: No free air. Musculoskeletal: Thoracolumbar scoliosis.  RIGHT hip arthroplasty. IMPRESSION: 1. There is mild to moderate RIGHT-sided hydroureteronephrosis which is favored to be secondary to a 2 mm ureterolithiasis at the RIGHT UVJ. There is extensive adjacent fat stranding. Recommend correlation with urine analysis to evaluate for superimposed infection. 2. The gallbladder is distended; fat stranding along the inferior posterior margin is favored to be due to renal pathology. Recommend correlation with liver function tests and physical exam. 3. 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 There is a more focal irregular opacity of the RIGHT lower lobe which may reflect infection or atelectasis. Consider follow-up CT in 6 months to assess for resolution. Aortic Atherosclerosis (ICD10-I70.0). Electronically Signed   By: Valentino Saxon MD   On: 01/30/2021 17:58    EKG: Independently reviewed.  Atrial fibrillation 95.   Echocardiogram 11/ 27/ 2020 : 1. Left ventricular ejection fraction, by visual estimation, is 40 to 45%. The left ventricle has moderately decreased function. There is mildly increased left ventricular hypertrophy. 2. Left ventricular diastolic parameters are consistent with Grade I diastolic dysfunction (impaired relaxation). 3. Mild to moderately dilated left ventricular internal cavity size. 4. Moderate, fixed thrombus on the lateral wall of the left ventricle. 5. Global right ventricle has mildly reduced systolic function.The right ventricular size is mildly enlarged. Mildly increased right ventricular wall thickness. 6. Left atrial size was mild-moderately dilated. 7. Right atrial size was moderately dilated. 8. Trivial pericardial effusion is present. 9. The mitral valve is myxomatous. Moderate to severe mitral valve  regurgitation. 10. The tricuspid valve is grossly normal. Tricuspid valve regurgitation mild-moderate. 11. The aortic valve was not well visualized. Aortic valve regurgitation is not visualized. 12. The pulmonic valve was normal in structure. Pulmonic valve regurgitation is mild. 13. The aortic root was not well visualized. 14. Moderately elevated pulmonary artery systolic pressure. 15. The interatrial septum was not assessed.  Assessment/Plan Principal Problem:   Severe sepsis (HCC) Active Problems:   Atrial fibrillation, chronic (HCC)   CAD (coronary artery disease)   CHF (congestive heart failure) (HCC)   AMS (altered mental status) Severe sepsis: Attribute to patient's ureteral obstruction and hydronephrosis. We will continue patient on IV antibiotics with cefepime. Patient received vancomycin which I do not suspect will need to be continued. Will admit patient to progressive unit,. We will continue patient on IV fluids.  A. fib: We will hold patient's Coumadin, and aspirin. Currently patient in sinus rhythm.  Heart disease: Resume home meds once med rec is available. Antiplatelets with aspirin held to prevent bleeding during procedure.    Congestive heart failure: Currently stable. As needed Lasix. Continue patient on diltiazem.  Altered mental status: Acute onset, patient today is alert and oriented to self.  No gross neurodeficits.  Patient moving all 4 extremities.  Attribute patient's confusion to current infection and sepsis.   DVT prophylaxis:  Heparin  Code Status:  DNR when MOST form.  Family Communication:  Honeycutt,Jane (Daughter)  [phone] ([place])   Disposition Plan:  To be determined  Consults called:  Urology on-call ED physician.  Admission status: Patient   Para Skeans MD [facility] [phone] How to contact the [facility] Attending or Consulting provider [place] or covering provider during after hours [place], for this  patient.    Check the care team in [facility] and look for a) attending/consulting [facility] provider listed and b) the [facility] team listed Log into [email or web address] and use [place]'s universal password to access. If you do not have the password, please contact the hospital operator. Locate the [facility] provider you are looking for under [facility] and page to a number that you can be directly reached. If you still have difficulty reaching the provider, please page the [facility] (Director on Call) for the Hospitalists listed on amion for assistance. [email or web address] Password [facility] 01/30/2021, 8:05 PM

## 2021-01-30 NOTE — Op Note (Signed)
Preoperative diagnosis:  Right distal ureteral calculus Severe sepsis secondary to above  Postoperative diagnosis:  Same  Procedure:  Cystoscopy Right ureteral stent placement (65F/24 cm)  Right retrograde pyelography with interpretation   Surgeon: Nicki Reaper C. Myia Bergh, M.D.  Anesthesia: General  Complications: None  Intraoperative findings:  Cystoscopy: Patchy mucosal erythema endoscopically consistent with cystitis right UO normal in appearance, no efflux noted clear efflux left UO Right retrograde pyelogram: Moderate right hydronephrosis with mild hydroureter to the midportion of the distal ureter.  The proximal ureter just prior to the UPJ with a 360 degree loop  EBL: Minimal  Specimens: Urine right renal pelvis for culture  Indication: MADOLINE BHATT is a 87 y.o. female who presented to the ED from her nursing facility with a 24-hour history of worsening mental status changes and blood work at her facility remarkable for leukocytosis.  Evaluation in the ED remarkable for lactic acid 4.0/leukocytosis 38.6.  CT with mild to moderate right hydronephrosis and hydroureter to a 2 mm distal ureteral calculus.  Emergent stent placement was recommended after discussing with her daughter who is her HCPOA.  We have discussed the potential benefits and risks of the procedure, side effects of the proposed treatment, the likelihood of the patient achieving the goals of the procedure, and any potential problems that might occur during the procedure or recuperation. Informed consent has been obtained.  Description of procedure:  The patient was taken to the operating room and general anesthesia was induced.  The patient was placed in the dorsal lithotomy position, prepped and draped in the usual sterile fashion.  Antibiotics had been administered in the ED.  A preoperative time-out was performed.   A 21 French cystoscope was lubricated and passed per urethra.  Panendoscopy was performed with findings  as described above.  A 0.038 Sensor wire was placed through the cystoscope and into the right ureteral orifice.  Approximately 2 cm and resistance was met and the wire was able to be negotiated past this resistance and once this occurred there was brisk efflux of dark, murky urine from the ureteral orifice.  The guidewire was advanced to the renal pelvis under fluoroscopic guidance.  A 5 French open-ended ureteral catheter was then advanced over the guidewire to the region of the renal pelvis/proximal ureter.  15 mm of dark slightly cloudy urine was aspirated and sent for culture.  Retrograde pyelogram was then performed with findings as described above.  The Sensor wire was placed into the open-ended ureteral catheter however could not be completely advanced into the renal pelvis beyond the looping proximal ureter.  It was replaced with a 0.038 Glidewire which advanced into the renal pelvis easily.  The open-ended catheter was advanced over the wire into the renal pelvis however the loop did not straighten.  A 65F/24 cm Contour ureteral stent was advanced over the wire.  The curl was positioned in the renal pelvis on fluoroscopic guidance.  The guidewire was removed and the distal end of the stent was well positioned.  Fluoroscopy remarkable for the proximal loop still present however the proximal curl was in the renal pelvis.  A 16 French Foley catheter was placed with return of pink-tinged urine.  The catheter was placed to gravity drainage.  After anesthetic reversal she was transported to the PACU in stable condition.  Recommendation: Foley catheter drainage 48-72 hours   John Giovanni, MD

## 2021-01-30 NOTE — Sepsis Progress Note (Signed)
Sepsis protocol followed by eLink 

## 2021-01-30 NOTE — ED Notes (Signed)
Report to RN Maudie Mercury of the Operating Room

## 2021-01-30 NOTE — ED Triage Notes (Signed)
Pt had blood work done at her facility and lab work was elevated. NP at facility wanted her sent out. PT has also been less responsive,

## 2021-01-31 ENCOUNTER — Inpatient Hospital Stay (HOSPITAL_COMMUNITY)
Admit: 2021-01-31 | Discharge: 2021-01-31 | Disposition: A | Discharge status: Home / Self Care | Payer: Medicare Other | Attending: Internal Medicine | Admitting: Internal Medicine

## 2021-01-31 ENCOUNTER — Inpatient Hospital Stay: Payer: Medicare Other

## 2021-01-31 DIAGNOSIS — R778 Other specified abnormalities of plasma proteins: Secondary | ICD-10-CM | POA: Diagnosis not present

## 2021-01-31 DIAGNOSIS — I361 Nonrheumatic tricuspid (valve) insufficiency: Secondary | ICD-10-CM

## 2021-01-31 DIAGNOSIS — I340 Nonrheumatic mitral (valve) insufficiency: Secondary | ICD-10-CM

## 2021-01-31 LAB — ECHOCARDIOGRAM COMPLETE BUBBLE STUDY
AR max vel: 0.95 cm2
AV Peak grad: 3.9 mmHg
Ao pk vel: 0.99 m/s
Area-P 1/2: 5.66 cm2
S' Lateral: 2.66 cm

## 2021-01-31 LAB — COMPREHENSIVE METABOLIC PANEL
ALT: 34 U/L (ref 0–44)
AST: 43 U/L — ABNORMAL HIGH (ref 15–41)
Albumin: 3.2 g/dL — ABNORMAL LOW (ref 3.5–5.0)
Alkaline Phosphatase: 71 U/L (ref 38–126)
Anion gap: 9 (ref 5–15)
BUN: 40 mg/dL — ABNORMAL HIGH (ref 8–23)
CO2: 25 mmol/L (ref 22–32)
Calcium: 7.8 mg/dL — ABNORMAL LOW (ref 8.9–10.3)
Chloride: 106 mmol/L (ref 98–111)
Creatinine, Ser: 1.76 mg/dL — ABNORMAL HIGH (ref 0.44–1.00)
GFR, Estimated: 26 mL/min — ABNORMAL LOW (ref >60)
Glucose, Bld: 147 mg/dL — ABNORMAL HIGH (ref 70–99)
Potassium: 4.8 mmol/L (ref 3.5–5.1)
Sodium: 140 mmol/L (ref 135–145)
Total Bilirubin: 1 mg/dL (ref 0.3–1.2)
Total Protein: 5.8 g/dL — ABNORMAL LOW (ref 6.5–8.1)

## 2021-01-31 LAB — CBC
HCT: 40.2 % (ref 36.0–46.0)
Hemoglobin: 12.7 g/dL (ref 12.0–15.0)
MCH: 30.8 pg (ref 26.0–34.0)
MCHC: 31.6 g/dL (ref 30.0–36.0)
MCV: 97.6 fL (ref 80.0–100.0)
Platelets: 208 10*3/uL (ref 150–400)
RBC: 4.12 MIL/uL (ref 3.87–5.11)
RDW: 14 % (ref 11.5–15.5)
WBC: 31.6 10*3/uL — ABNORMAL HIGH (ref 4.0–10.5)
nRBC: 0 % (ref 0.0–0.2)

## 2021-01-31 LAB — MAGNESIUM
Magnesium: 1.6 mg/dL — ABNORMAL LOW (ref 1.7–2.4)
Magnesium: 2.2 mg/dL (ref 1.7–2.4)

## 2021-01-31 LAB — MRSA NEXT GEN BY PCR, NASAL: MRSA by PCR Next Gen: NOT DETECTED

## 2021-01-31 LAB — LACTIC ACID, PLASMA
Lactic Acid, Venous: 2.4 mmol/L (ref 0.5–1.9)
Lactic Acid, Venous: 2.6 mmol/L (ref 0.5–1.9)
Lactic Acid, Venous: 3.3 mmol/L (ref 0.5–1.9)

## 2021-01-31 LAB — TROPONIN I (HIGH SENSITIVITY)
Troponin I (High Sensitivity): 120 ng/L (ref <18)
Troponin I (High Sensitivity): 135 ng/L (ref <18)
Troponin I (High Sensitivity): 170 ng/L (ref <18)

## 2021-01-31 LAB — PHOSPHORUS: Phosphorus: 4.6 mg/dL (ref 2.5–4.6)

## 2021-01-31 LAB — PROTIME-INR
INR: 3.4 — ABNORMAL HIGH (ref 0.8–1.2)
INR: 3.3 — ABNORMAL HIGH (ref 0.8–1.2)
Prothrombin Time: 34.1 seconds — ABNORMAL HIGH (ref 11.4–15.2)
Prothrombin Time: 33.1 seconds — ABNORMAL HIGH (ref 11.4–15.2)

## 2021-01-31 LAB — GLUCOSE, CAPILLARY: Glucose-Capillary: 113 mg/dL — ABNORMAL HIGH (ref 70–99)

## 2021-01-31 LAB — BRAIN NATRIURETIC PEPTIDE: B Natriuretic Peptide: 821.8 pg/mL — ABNORMAL HIGH (ref 0.0–100.0)

## 2021-01-31 MED ORDER — NOREPINEPHRINE 4 MG/250ML-% IV SOLN: 2.0000 ug/min | INTRAVENOUS | Status: DC

## 2021-01-31 MED ORDER — METRONIDAZOLE 500 MG/100ML IV SOLN
500.0000 mg | Freq: Three times a day (TID) | INTRAVENOUS | Status: DC
  Administered 2021-01-31 (×2): 500 mg via INTRAVENOUS
  Filled 2021-01-31 (×4): qty 100

## 2021-01-31 MED ORDER — SODIUM CHLORIDE 0.9 % IV SOLN: 250.0000 mL | INTRAVENOUS | Status: DC

## 2021-01-31 MED ORDER — ORAL CARE MOUTH RINSE
15.0000 mL | Freq: Two times a day (BID) | OROMUCOSAL | Status: DC
  Administered 2021-01-31 – 2021-02-02 (×5): 15 mL via OROMUCOSAL

## 2021-01-31 MED ORDER — FUROSEMIDE 10 MG/ML IJ SOLN
40.0000 mg | Freq: Once | INTRAMUSCULAR | Status: AC
  Administered 2021-01-31: 40 mg via INTRAVENOUS
  Filled 2021-01-31: qty 4

## 2021-01-31 MED ORDER — FAMOTIDINE 20 MG PO TABS
20.0000 mg | ORAL_TABLET | Freq: Two times a day (BID) | ORAL | Status: DC
  Administered 2021-02-01 – 2021-02-02 (×3): 20 mg via ORAL
  Filled 2021-01-31 (×3): qty 1

## 2021-01-31 MED ORDER — FUROSEMIDE 10 MG/ML IJ SOLN
40.0000 mg | Freq: Once | INTRAMUSCULAR | Status: DC
  Filled 2021-01-31: qty 4

## 2021-01-31 MED ORDER — PERFLUTREN LIPID MICROSPHERE
1.0000 mL | INTRAVENOUS | Status: AC | PRN
  Administered 2021-01-31: 3 mL via INTRAVENOUS
  Filled 2021-01-31: qty 10

## 2021-01-31 MED ORDER — IPRATROPIUM BROMIDE 0.02 % IN SOLN: 0.5000 mg | Freq: Two times a day (BID) | RESPIRATORY_TRACT | Status: DC

## 2021-01-31 MED ORDER — MAGNESIUM SULFATE 2 GM/50ML IV SOLN
2.0000 g | Freq: Once | INTRAVENOUS | Status: AC
  Administered 2021-01-31: 2 g via INTRAVENOUS
  Filled 2021-01-31: qty 50

## 2021-01-31 MED ORDER — IPRATROPIUM BROMIDE 0.02 % IN SOLN: 0.5000 mg | Freq: Three times a day (TID) | RESPIRATORY_TRACT | Status: DC

## 2021-01-31 MED ORDER — PANTOPRAZOLE SODIUM 40 MG IV SOLR
40.0000 mg | Freq: Every day | INTRAVENOUS | Status: DC
  Administered 2021-01-31: 40 mg via INTRAVENOUS
  Filled 2021-01-31: qty 40

## 2021-01-31 MED ORDER — IPRATROPIUM BROMIDE 0.02 % IN SOLN
0.5000 mg | Freq: Three times a day (TID) | RESPIRATORY_TRACT | Status: DC
  Administered 2021-01-31 – 2021-02-02 (×7): 0.5 mg via RESPIRATORY_TRACT
  Filled 2021-01-31 (×7): qty 2.5

## 2021-01-31 NOTE — Consult Note (Signed)
[place] for IV Heparin Indication: atrial fibrillation  Patient Measurements: Heparin Dosing Weight: 59 kg  Labs: Recent Labs    01/30/21 1707 01/30/21 2359 01/31/21 0503 01/31/21 0858 01/31/21 1030  HGB 13.2  --  12.7  --   --   HCT 41.4  --  40.2  --   --   PLT 229  --  208  --   --   APTT 45*  --   --   --   --   LABPROT 27.9*  --   --   --  34.1*  INR 2.6*  --   --   --  3.4*  CREATININE 2.24*  --  1.76*  --   --   TROPONINIHS 117* 170* 135* 120*  --     Estimated Creatinine Clearance: 17.6 mL/min (A) (by C-G formula based on SCr of 1.76 mg/dL (H)).   Medical History: Past Medical History:  Diagnosis Date   Atrial fibrillation ([place])    Heart failure (HCC)    Hyperlipidemia    Hypertension    Hypokalemia    Osteoarthritis     Medications:  Warfarin 3.5 mg daily (last dose 7/21)  Assessment: Patient is a 87 y/o F with medical history as above and including atrial fibrillation on warfarin who is admitted with sepsis secondary to infected stone. She had emergent cystoscopy with right ureteral stent placement 7/22. Pharmacy consulted to initiate heparin infusion while home warfarin on hold.   Date INR Plan  7/22 2.6 Warfarin held  7/23 3.4 Warfarin held    Goal of Therapy:  Heparin level 0.3-0.7 units/ml Monitor platelets by anticoagulation protocol: Yes   Plan:  --Discussed with provider. Will hold off on heparin today given supratherapeutic INR. Suspect INR elevated in setting of combination of warfarin and sepsis --Will re-assess tomorrow  Benita Gutter 01/31/2021,11:37 AM

## 2021-02-01 ENCOUNTER — Encounter: Payer: Self-pay | Admitting: Internal Medicine

## 2021-02-01 DIAGNOSIS — R6520 Severe sepsis without septic shock: Secondary | ICD-10-CM | POA: Diagnosis not present

## 2021-02-01 DIAGNOSIS — A419 Sepsis, unspecified organism: Secondary | ICD-10-CM | POA: Diagnosis not present

## 2021-02-01 LAB — CBC WITH DIFFERENTIAL/PLATELET
Abs Immature Granulocytes: 0.85 10*3/uL — ABNORMAL HIGH (ref 0.00–0.07)
Basophils Absolute: 0.1 10*3/uL (ref 0.0–0.1)
Basophils Relative: 0 %
Eosinophils Absolute: 0 10*3/uL (ref 0.0–0.5)
Eosinophils Relative: 0 %
HCT: 34.6 % — ABNORMAL LOW (ref 36.0–46.0)
Hemoglobin: 11.2 g/dL — ABNORMAL LOW (ref 12.0–15.0)
Immature Granulocytes: 4 %
Lymphocytes Relative: 1 %
Lymphs Abs: 0.3 10*3/uL — ABNORMAL LOW (ref 0.7–4.0)
MCH: 31.1 pg (ref 26.0–34.0)
MCHC: 32.4 g/dL (ref 30.0–36.0)
MCV: 96.1 fL (ref 80.0–100.0)
Monocytes Absolute: 0.7 10*3/uL (ref 0.1–1.0)
Monocytes Relative: 3 %
Neutro Abs: 21.7 10*3/uL — ABNORMAL HIGH (ref 1.7–7.7)
Neutrophils Relative %: 92 %
Platelets: 163 10*3/uL (ref 150–400)
RBC: 3.6 MIL/uL — ABNORMAL LOW (ref 3.87–5.11)
RDW: 13.8 % (ref 11.5–15.5)
Smear Review: NORMAL
WBC: 23.6 10*3/uL — ABNORMAL HIGH (ref 4.0–10.5)
nRBC: 0 % (ref 0.0–0.2)

## 2021-02-01 LAB — BASIC METABOLIC PANEL
Anion gap: 8 (ref 5–15)
BUN: 43 mg/dL — ABNORMAL HIGH (ref 8–23)
CO2: 26 mmol/L (ref 22–32)
Calcium: 8.4 mg/dL — ABNORMAL LOW (ref 8.9–10.3)
Chloride: 104 mmol/L (ref 98–111)
Creatinine, Ser: 0.94 mg/dL (ref 0.44–1.00)
GFR, Estimated: 56 mL/min — ABNORMAL LOW (ref >60)
Glucose, Bld: 130 mg/dL — ABNORMAL HIGH (ref 70–99)
Potassium: 4 mmol/L (ref 3.5–5.1)
Sodium: 138 mmol/L (ref 135–145)

## 2021-02-01 LAB — MAGNESIUM: Magnesium: 1.9 mg/dL (ref 1.7–2.4)

## 2021-02-01 LAB — PROTIME-INR
INR: 2.3 — ABNORMAL HIGH (ref 0.8–1.2)
Prothrombin Time: 25.1 seconds — ABNORMAL HIGH (ref 11.4–15.2)

## 2021-02-01 LAB — URINE CULTURE: Culture: NO GROWTH

## 2021-02-01 LAB — PROCALCITONIN: Procalcitonin: 55.68 ng/mL

## 2021-02-01 MED ORDER — PREDNISONE 10 MG PO TABS
5.0000 mg | ORAL_TABLET | Freq: Every day | ORAL | Status: DC
  Administered 2021-02-02: 5 mg via ORAL
  Filled 2021-02-01: qty 1

## 2021-02-01 MED ORDER — METOPROLOL SUCCINATE ER 50 MG PO TB24
100.0000 mg | ORAL_TABLET | Freq: Every day | ORAL | Status: DC
  Filled 2021-02-01: qty 2

## 2021-02-01 MED ORDER — SODIUM CHLORIDE 0.9 % IV SOLN
2.0000 g | Freq: Two times a day (BID) | INTRAVENOUS | Status: DC
  Administered 2021-02-01 – 2021-02-02 (×3): 2 g via INTRAVENOUS
  Filled 2021-02-01 (×4): qty 2

## 2021-02-01 MED ORDER — WARFARIN SODIUM 3 MG PO TABS
3.5000 mg | ORAL_TABLET | Freq: Once | ORAL | Status: AC
  Administered 2021-02-01: 3.5 mg via ORAL
  Filled 2021-02-01: qty 1

## 2021-02-01 MED ORDER — WARFARIN - PHARMACIST DOSING INPATIENT
Freq: Every day | Status: DC
  Administered 2021-02-01: 1

## 2021-02-01 NOTE — Anesthesia Postprocedure Evaluation (Signed)
Anesthesia Post Note  Patient: Judith Keller  Procedure(s) Performed: CYSTOSCOPY WITH STENT PLACEMENT (Right)  Patient location during evaluation: ICU Anesthesia Type: General Level of consciousness: awake and alert Pain management: pain level controlled Vital Signs Assessment: post-procedure vital signs reviewed and stable Respiratory status: spontaneous breathing Cardiovascular status: blood pressure returned to baseline Postop Assessment: adequate PO intake Anesthetic complications: no   No notable events documented.   Last Vitals:  Vitals:   02/01/21 0600 02/01/21 0700  BP: 132/82 112/66  Pulse: (!) 112 (!) 106  Resp: 16 18  Temp:    SpO2: 93% 93%    Last Pain:  Vitals:   02/01/21 0400  TempSrc: Axillary  PainSc: 0-No pain                 Milinda Pointer

## 2021-02-01 NOTE — Progress Notes (Signed)
PHARMACY NOTE:  ANTIMICROBIAL RENAL DOSAGE ADJUSTMENT  Current antimicrobial regimen includes a mismatch between antimicrobial dosage and estimated renal function.  As per policy approved by the Pharmacy & Therapeutics and Medical Executive Committees, the antimicrobial dosage will be adjusted accordingly.  Current antimicrobial dosage:  Cefepime 2 g IV q24h  Indication: Urosepsis  Renal Function:  Estimated Creatinine Clearance: 32.9 mL/min (by C-G formula based on SCr of 0.94 mg/dL).  Antimicrobial dosage has been changed to:  Cefepime 2 g IV q12h   Thank you for allowing pharmacy to be a part of this patient's care.  Benita Gutter, [facility] 02/01/2021 7:36 AM

## 2021-02-01 NOTE — Progress Notes (Addendum)
PROGRESS NOTE    Judith Keller  [id number] DOB: 14-Mar-1925 DOA: 01/30/2021 PCP: Harlow Ohms, MD  Outpatient Specialists: cardiology    HPI:   Judith Keller is a 87 y.o. female seen in ed for AMS. Pt at baseline is A/O and this confusion has been going on for about 24 hour and is new.  Also at facility pt was noted to have high white count and was unresponsive to treatment .Daughter is HCPOA and pt has most form and is DNR.HPI is limited due to AMS.PT noted to have vomited twice prior to coming to ER.   Assessment & Plan:   Principal Problem:   Severe sepsis ([place]) Active Problems:   Atrial fibrillation, chronic (HCC)   CAD (coronary artery disease)   CHF (congestive heart failure) (HCC)   AMS (altered mental status)  # Pyelonephritis # Severe sepsis 2/2 obstructing stone, now s/p stent. Required low-dose pressors initially, now weaned off. Sepsis physiology resolved though BPs remain soft. - continue cefepime for now - f/u urine culture, blood culture  # Obstructive ureteral calculus S/p stent 7/22.  - maintain foley, d/c per urology - f/u urology recs when available  # Chronic hypoxic respiratory failure On o2 at night. Currently on 2 L. Questionable aspiration event prior to arrival to hospital, there is a small opacity in right lower lung. Pt denies cough or dyspnea, says breathing is at baseline. - not treating for bacterial pneumonia for now though continuing cefepime - wean o2 as able - monitor  # Atrial fibrillation Rvr to 100-110 this morning, hemodynamically stable - resume home metop when bp improves - hold home dilt for now - resume warfarin  # HFpEF Not in exacerbation - resume metop when bp improves - holding home lasix  # Hypothyroid - cont home synthroid  # MDD - cont home celexa  # PMR - cont home prednisone   DVT prophylaxis: warfarin Code Status: DNR Family Communication: daughter updated @ bedside 7/24  Level of care:  Med-Surg Status is: Inpatient  Remains inpatient appropriate because:Inpatient level of care appropriate due to severity of illness  Dis[facility]: [facility]: [facility]              Anticipated d/c is to: SNF              Patient currently is not medically stable to d/c.   Difficult to place patient No        Consultants:  urology  Procedures: Ureteral stent   Antimicrobials:  cefepime    Subjective: This morning feels weak. No abd pain. Tolerating diet. No vomiting  Objective: Vitals:   02/01/21 0300 02/01/21 0400 02/01/21 0600 02/01/21 0700  BP: 115/70 115/70 132/82 112/66  Pulse: (!) 101 (!) 130 (!) 112 (!) 106  Resp: '19 18 16 18  '$ Temp:  98.7 F (37.1 C)    TempSrc:  Axillary    SpO2: 93% 93% 93% 93%  Weight:      Height:        Intake/Output Summary (Last 24 hours) at 02/01/2021 0915 Last data filed at 02/01/2021 0500 Gross per 24 hour  Intake 515.67 ml  Output 1450 ml  Net -934.33 ml   Filed Weights   01/30/21 1650  Weight: 59 kg    Examination:  General exam: Appears calm and comfortable  Respiratory system: rales at bases Cardiovascular system: S1 & S2 heard, RRR. No JVD, murmurs, rubs, gallops or clicks. No pedal edema. Gastrointestinal system:  Abdomen is nondistended, soft and nontender. No organomegaly or masses felt. Normal bowel sounds heard. Central nervous system: Alert and oriented. No focal neurological deficits. Extremities: Symmetric 5 x 5 power. Skin: No rashes, lesions or ulcers Psychiatry: Judgement and insight appear normal. Mood & affect appropriate.     Data Reviewed: I have personally reviewed following labs and imaging studies  CBC: Recent Labs  Lab 01/30/21 1707 01/31/21 0503 02/01/21 0524  WBC 38.6* 31.6* 23.6*  NEUTROABS 34.7*  --  21.7*  HGB 13.2 12.7 11.2*  HCT 41.4 40.2 34.6*  MCV 98.3 97.6 96.1  PLT 229 208 [id number]   Basic Metabolic Panel: Recent Labs  Lab 01/30/21 1707 01/31/21 0503 01/31/21 0858  02/01/21 0524  NA 140 140  --  138  K 4.8 4.8  --  4.0  CL 105 106  --  104  CO2 24 25  --  26  GLUCOSE 111* 147*  --  130*  BUN 39* 40*  --  43*  CREATININE 2.24* 1.76*  --  0.94  CALCIUM 8.2* 7.8*  --  8.4*  MG  --  1.6* 2.2 1.9  PHOS  --  4.6  --   --    GFR: Estimated Creatinine Clearance: 32.9 mL/min (by C-G formula based on SCr of 0.94 mg/dL). Liver Function Tests: Recent Labs  Lab 01/30/21 1707 01/31/21 0503  AST 55* 43*  ALT 41 34  ALKPHOS 74 71  BILITOT 1.0 1.0  PROT 6.2* 5.8*  ALBUMIN 3.6 3.2*   No results for input(s): LIPASE, AMYLASE in the last 168 hours. No results for input(s): AMMONIA in the last 168 hours. Coagulation Profile: Recent Labs  Lab 01/30/21 1707 01/31/21 1030 01/31/21 1925  INR 2.6* 3.4* 3.3*   Cardiac Enzymes: No results for input(s): CKTOTAL, CKMB, CKMBINDEX, TROPONINI in the last 168 hours. BNP (last 3 results) No results for input(s): PROBNP in the last 8760 hours. HbA1C: No results for input(s): HGBA1C in the last 72 hours. CBG: Recent Labs  Lab 01/30/21 2334  GLUCAP 113*   Lipid Profile: No results for input(s): CHOL, HDL, LDLCALC, TRIG, CHOLHDL, LDLDIRECT in the last 72 hours. Thyroid Function Tests: No results for input(s): TSH, T4TOTAL, FREET4, T3FREE, THYROIDAB in the last 72 hours. Anemia Panel: No results for input(s): VITAMINB12, FOLATE, FERRITIN, TIBC, IRON, RETICCTPCT in the last 72 hours. Urine analysis:    Component Value Date/Time   COLORURINE AMBER (A) 01/30/2021 1853   APPEARANCEUR HAZY (A) 01/30/2021 1853   LABSPEC 1.019 01/30/2021 1853   PHURINE 5.0 01/30/2021 1853   GLUCOSEU NEGATIVE 01/30/2021 1853   HGBUR SMALL (A) 01/30/2021 1853   BILIRUBINUR NEGATIVE 01/30/2021 1853   KETONESUR NEGATIVE 01/30/2021 1853   PROTEINUR 30 (A) 01/30/2021 1853   NITRITE NEGATIVE 01/30/2021 1853   LEUKOCYTESUR MODERATE (A) 01/30/2021 1853   Sepsis Labs: '@LABRCNTIP'$ (procalcitonin:4,lacticidven:4)  ) Recent Results  (from the past 240 hour(s))  Resp Panel by RT-PCR (Flu A&B, Covid) Nasopharyngeal Swab     Status: None   Collection Time: 01/30/21  5:07 PM   Specimen: Nasopharyngeal Swab; Nasopharyngeal(NP) swabs in vial transport medium  Result Value Ref Range Status   SARS Coronavirus 2 by RT PCR NEGATIVE NEGATIVE Final    Comment: (NOTE) SARS-CoV-2 target nucleic acids are NOT DETECTED.  The SARS-CoV-2 RNA is generally detectable in upper respiratory specimens during the acute phase of infection. The lowest concentration of SARS-CoV-2 viral copies this assay can detect is 138 copies/mL. A negative result does not preclude  SARS-Cov-2 infection and should not be used as the sole basis for treatment or other patient management decisions. A negative result may occur with  improper specimen collection/handling, submission of specimen other than nasopharyngeal swab, presence of viral mutation(s) within the areas targeted by this assay, and inadequate number of viral copies(<138 copies/mL). A negative result must be combined with clinical observations, patient history, and epidemiological information. The expected result is Negative.  Fact Sheet for Patients:  EntrepreneurPulse.com.au  Fact Sheet for Healthcare Providers:  IncredibleEmployment.be  This test is no t yet approved or cleared by the [place] FDA and  has been authorized for detection and/or diagnosis of SARS-CoV-2 by FDA under an Emergency Use Authorization (EUA). This EUA will remain  in effect (meaning this test can be used) for the duration of the COVID-19 declaration under Section 564(b)(1) of the Act, 21 U.S.C.section 360bbb-3(b)(1), unless the authorization is terminated  or revoked sooner.       Influenza A by PCR NEGATIVE NEGATIVE Final   Influenza B by PCR NEGATIVE NEGATIVE Final    Comment: (NOTE) The Xpert Xpress SARS-CoV-2/FLU/RSV plus assay is intended as an aid in the  diagnosis of influenza from Nasopharyngeal swab specimens and should not be used as a sole basis for treatment. Nasal washings and aspirates are unacceptable for Xpert Xpress SARS-CoV-2/FLU/RSV testing.  Fact Sheet for Patients: EntrepreneurPulse.com.au  Fact Sheet for Healthcare Providers: IncredibleEmployment.be  This test is not yet approved or cleared by the [place] FDA and has been authorized for detection and/or diagnosis of SARS-CoV-2 by FDA under an Emergency Use Authorization (EUA). This EUA will remain in effect (meaning this test can be used) for the duration of the COVID-19 declaration under Section 564(b)(1) of the Act, 21 U.S.C. section 360bbb-3(b)(1), unless the authorization is terminated or revoked.  Performed at [facility], [address] [place], [place] [address]   Blood Culture (routine x 2)     Status: None (Preliminary result)   Collection Time: 01/30/21  5:07 PM   Specimen: BLOOD  Result Value Ref Range Status   Specimen Description BLOOD BLOOD RIGHT FOREARM  Final   Special Requests   Final    BOTTLES DRAWN AEROBIC AND ANAEROBIC Blood Culture results may not be optimal due to an inadequate volume of blood received in culture bottles   Culture   Final    NO GROWTH < 12 HOURS Performed at [facility], [address] [place], [place] [address]    Report Status PENDING  Incomplete  Blood Culture (routine x 2)     Status: None (Preliminary result)   Collection Time: 01/30/21  5:07 PM   Specimen: BLOOD  Result Value Ref Range Status   Specimen Description BLOOD BLOOD LEFT WRIST  Final   Special Requests   Final    BOTTLES DRAWN AEROBIC AND ANAEROBIC Blood Culture adequate volume   Culture   Final    NO GROWTH < 12 HOURS Performed at [facility], [address], [place], [place] [address]    Report Status PENDING  Incomplete  Urine Culture     Status: None (Preliminary result)    Collection Time: 01/30/21  6:53 PM   Specimen: In/Out Cath Urine  Result Value Ref Range Status   Specimen Description   Final    IN/OUT CATH URINE Performed at [facility], [address] [place], [place] [address]    Special Requests   Final    NONE Performed at [facility], [address], [place], [place] [address]    Culture   Final    CULTURE REINCUBATED FOR BETTER GROWTH Performed at [facility], [place] [address], [place], [place] [address]    Report Status PENDING  Incomplete  MRSA Next Gen by PCR, Nasal     Status: None   Collection Time: 01/31/21 12:21 AM   Specimen: Nasal Mucosa; Nasal Swab  Result Value Ref Range Status   MRSA by PCR Next Gen NOT DETECTED NOT DETECTED Final    Comment: (NOTE) The GeneXpert MRSA Assay (FDA approved for NASAL specimens only), is one component of a comprehensive MRSA colonization surveillance program. It is not intended to diagnose MRSA infection nor to guide or monitor treatment for MRSA infections. Test performance is not FDA approved in patients less than 82 years old. Performed at [facility], [address] [place], [place] [address]          Radiology Studies: CT Head Wo Contrast  Result Date: 01/30/2021 CLINICAL DATA:  Nausea/vomiting EXAM: CT HEAD WITHOUT CONTRAST TECHNIQUE: Contiguous axial images were obtained from the base of the skull through the vertex without intravenous contrast. COMPARISON:  CT head 11/15/2018 FINDINGS: Brain: Cerebral ventricle sizes are concordant with the degree of cerebral volume loss. Patchy and confluent areas of decreased attenuation are noted throughout the deep and periventricular white matter of the cerebral hemispheres bilaterally, compatible with chronic microvascular ischemic disease. No evidence of large-territorial acute infarction. No parenchymal hemorrhage. No mass lesion. No extra-axial collection. No mass effect or midline shift. No  hydrocephalus. Basilar cisterns are patent. Vascular: No hyperdense vessel. Atherosclerotic calcifications are present within the cavernous internal carotid arteries. Skull: No acute fracture or focal lesion. Sinuses/Orbits: Paranasal sinuses and mastoid air cells are clear. Bilateral lens replacement. Otherwise orbits are unremarkable. Other: None. IMPRESSION: No acute intracranial abnormality. Electronically Signed   By: Iven Finn M.D.   On: 01/30/2021 17:39   US RENAL  Result Date: 01/31/2021 CLINICAL DATA:  Elevated AST. EXAM: RENAL / URINARY TRACT ULTRASOUND COMPLETE COMPARISON:  None. FINDINGS: Right Kidney: Renal measurements: 12.3 x 4.6 x 4.5 cm = volume: 134 mL. Echogenicity within normal limits. No mass or hydronephrosis visualized. Left Kidney: Renal measurements: 10.7 x 5.3 x 5.1 cm = volume: 153 mL. Echogenicity within normal limits. No mass or hydronephrosis visualized. Bladder: Decompressed secondary to Foley catheter. Other: None. IMPRESSION: No definite renal abnormality noted. Electronically Signed   By: Marijo Conception M.D.   On: 01/31/2021 10:12   DG Chest Port 1 View  Result Date: 01/30/2021 CLINICAL DATA:  Sepsis. EXAM: PORTABLE CHEST 1 VIEW COMPARISON:  July 14, 2019. FINDINGS: Stable cardiomegaly. Mild central pulmonary vascular congestion is noted. No pneumothorax is noted. Right lung is clear. Mild left basilar atelectasis or infiltrate is noted with probable associated effusion. Bony thorax is unremarkable. IMPRESSION: Mild cardiomegaly with central pulmonary vascular congestion. Mild left basilar atelectasis or infiltrate with probable associated pleural effusion. Aortic Atherosclerosis (ICD10-I70.0). Electronically Signed   By: Marijo Conception M.D.   On: 01/30/2021 17:23   DG OR UROLOGY CYSTO IMAGE ([facility] ONLY)  Result Date: 01/30/2021 There is no interpretation for this exam.  This order is for images obtained during a surgical procedure.  Please See "Surgeries" Tab  for more information regarding the procedure.   ECHOCARDIOGRAM COMPLETE BUBBLE STUDY  Result Date: 01/31/2021    ECHOCARDIOGRAM REPORT   Patient Name:   Selinda Orion Date of Exam:  01/31/2021 Medical Rec #:  [id number]    Height:       65.0 in Accession #:    [id number]   Weight:       130.0 lb Date of Birth:  [birth year]     BSA:          1.647 m Patient Age:    84 years     BP:           114/71 mmHg Patient Gender: F            HR:           101 bpm. Exam Location:  [facility] Procedure: 2D Echo, Saline Contrast Bubble Study and Intracardiac Opacification            Agent Indications:     Elevated Troponin  History:         Patient has prior history of Echocardiogram examinations. CHF,                  CAD, Arrythmias:Atrial Fibrillation; Risk Factors:Dyslipidemia.  Sonographer:     L Thornton-Maynard Referring Phys:  [id number] ADAM ROSS [place] Diagnosing Phys: Dorris Carnes MD  Sonographer Comments: Suboptimal apical window. IMPRESSIONS  1. Poor acoustic windows limit study.  2. Left ventricular ejection fraction, by estimation, is 55 to 60%. The left ventricle has normal function. There is mild left ventricular hypertrophy. Left ventricular diastolic parameters are indeterminate.  3. Right ventricular systolic function is moderately reduced. The right ventricular size is mildly enlarged. There is moderately elevated pulmonary artery systolic pressure.  4. No obvious shunt across intraatrial septum as tested with injection of agitated saline Difficult images/acoustic windows limit study however.  5. Right atrial size was severely dilated.  6. The mitral valve is normal in structure. Mild mitral valve regurgitation.  7. The aortic valve is tricuspid. Aortic valve regurgitation is not visualized. Mild aortic valve sclerosis is present, with no evidence of aortic valve stenosis.  8. The inferior vena cava is dilated in size with >50% respiratory variability, suggesting right atrial pressure of 8 mmHg. FINDINGS  Left Ventricle:  Left ventricular ejection fraction, by estimation, is 55 to 60%. The left ventricle has normal function. Definity contrast agent was given IV to delineate the left ventricular endocardial borders. The left ventricular internal cavity size was normal in size. There is mild left ventricular hypertrophy. Left ventricular diastolic parameters are indeterminate. Right Ventricle: Compared to report of 2020, RV dysfunction are still present. The right ventricular size is mildly enlarged. Right vetricular wall thickness was not assessed. Right ventricular systolic function is moderately reduced. There is moderately  elevated pulmonary artery systolic pressure. The tricuspid regurgitant velocity is 3.16 m/s, and with an assumed right atrial pressure of 10 mmHg, the estimated right ventricular systolic pressure is [id number] mmHg. Left Atrium: Left atrial size was normal in size. Right Atrium: Right atrial size was severely dilated. Pericardium: Trivial pericardial effusion is present. Mitral Valve: The mitral valve is normal in structure. Mild mitral valve regurgitation. Tricuspid Valve: The tricuspid valve is grossly normal. Tricuspid valve regurgitation is mild. Aortic Valve: The aortic valve is tricuspid. Aortic valve regurgitation is not visualized. Mild aortic valve sclerosis is present, with no evidence of aortic valve stenosis. Aortic valve peak gradient measures 3.9 mmHg. Pulmonic Valve: The pulmonic valve was normal in structure. Pulmonic valve regurgitation is not visualized. Aorta: The aortic root is normal in size and structure. Venous: The inferior vena cava is dilated in size with greater than  50% respiratory variability, suggesting right atrial pressure of 8 mmHg. IAS/Shunts: Agitated saline contrast was given intravenously to evaluate for intracardiac shunting.  LEFT VENTRICLE PLAX 2D LVIDd:         4.23 cm  Diastology LVIDs:         2.66 cm  LV e' medial:    6.41 cm/s LV PW:         1.32 cm  LV E/e' medial:  15.0  LV IVS:        1.28 cm  LV e' lateral:   6.49 cm/s LVOT diam:     1.70 cm  LV E/e' lateral: 14.8 LV SV:         18 LV SV Index:   11 LVOT Area:     2.27 cm  RIGHT VENTRICLE RV S prime:     6.49 cm/s TAPSE (M-mode): 1.0 cm LEFT ATRIUM           Index LA diam:      3.70 cm 2.25 cm/m LA Vol (A2C): 35.7 ml 21.67 ml/m LA Vol (A4C): 66.1 ml 40.13 ml/m  AORTIC VALVE                PULMONIC VALVE AV Area (Vmax): 0.95 cm    PV Vmax:       0.45 m/s AV Vmax:        98.90 cm/s  PV Peak grad:  0.8 mmHg AV Peak Grad:   3.9 mmHg LVOT Vmax:      41.30 cm/s LVOT Vmean:     31.100 cm/s LVOT VTI:       0.079 m  AORTA Ao Root diam: 3.00 cm MITRAL VALVE               TRICUSPID VALVE MV Area (PHT): 5.66 cm    TR Peak grad:   39.9 mmHg MV Decel Time: 134 msec    TR Vmax:        316.00 cm/s MV E velocity: 96.00 cm/s                            SHUNTS                            Systemic VTI:  0.08 m                            Systemic Diam: 1.70 cm Dorris Carnes MD Electronically signed by Dorris Carnes MD Signature Date/Time: 01/31/2021/4:45:03 PM    Final    CT CHEST ABDOMEN PELVIS WO CONTRAST  Result Date: 01/30/2021 CLINICAL DATA:  Nausea/vomiting; blood work abnormal EXAM: CT CHEST, ABDOMEN AND PELVIS WITHOUT CONTRAST TECHNIQUE: Multidetector CT imaging of the chest, abdomen and pelvis was performed following the standard protocol without IV contrast. COMPARISON:  June 08, 2019 FINDINGS: Evaluation is limited secondary to lack of IV contrast and arms down positioning. CT CHEST FINDINGS Cardiovascular: Cardiomegaly. Three-vessel coronary artery atherosclerotic calcifications. No pericardial effusion. Atherosclerotic calcifications of the aorta. Mediastinum/Nodes: Thyroid is unremarkable. No axillary adenopathy. No mediastinal adenopathy. Lungs/Pleura: Evaluation is limited secondary to respiratory motion. Revisualization of perihilar predominant ground-glass opacities, overall mildly improved in comparison to prior. This likely  reflects the sequela of prior COVID-19 infection. There is a more focal irregular opacity of the RIGHT lower lobe which measures 17 by 16 mm (series 4, image 94 Musculoskeletal:  Thoracolumbar scoliosis. Evaluation for rib fractures limited due to respiratory motion. Status post RIGHT mastectomy. CT ABDOMEN PELVIS FINDINGS Hepatobiliary: The gallbladder is distended. No definitive adjacent pericholecystic fat stranding as adjacent stranding along the inferior posterior aspect is favored to be secondary to renal pathology. Unremarkable noncontrast appearance of the liver. Pancreas: No peripancreatic fat stranding. Spleen: Unremarkable Adrenals/Urinary Tract: Adrenal glands are unremarkable. There is mild to moderate RIGHT-sided hydroureteronephrosis. Evaluation of the pelvis is limited by streak artifact from adjacent hip arthroplasty but there is a 2 mm radiopaque density at the region of the RIGHT UVJ which is favored to reflect an obstructing ureterolithiasis. There is exuberant adjacent fat stranding along the RIGHT kidney. No LEFT-sided hydronephrosis. Bladder is poorly assessed. Stomach/Bowel: No evidence of bowel obstruction. Diverticulosis. No evidence of appendicitis. Vascular/Lymphatic: Atherosclerotic calcifications of the aorta. No suspicious lymphadenopathy. Reproductive: Poor visualization of the pelvis secondary to streak artifact. Other: No free air. Musculoskeletal: Thoracolumbar scoliosis.  RIGHT hip arthroplasty. IMPRESSION: 1. There is mild to moderate RIGHT-sided hydroureteronephrosis which is favored to be secondary to a 2 mm ureterolithiasis at the RIGHT UVJ. There is extensive adjacent fat stranding. Recommend correlation with urine analysis to evaluate for superimposed infection. 2. The gallbladder is distended; fat stranding along the inferior posterior margin is favored to be due to renal pathology. Recommend correlation with liver function tests and physical exam. 3. 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 There is a more focal irregular opacity of the RIGHT lower lobe which may reflect infection or atelectasis. Consider follow-up CT in 6 months to assess for resolution. Aortic Atherosclerosis (ICD10-I70.0). Electronically Signed   By: Valentino Saxon MD   On: 01/30/2021 17:58   US Abdomen Limited RUQ (LIVER/GB)  Result Date: 01/31/2021 CLINICAL DATA:  Elevated AST level. EXAM: ULTRASOUND ABDOMEN LIMITED RIGHT UPPER QUADRANT COMPARISON:  None. FINDINGS: Gallbladder: No gallstones or wall thickening visualized. No sonographic Murphy sign noted by sonographer. Small amount of sludge is noted within gallbladder lumen. Common bile duct: Diameter: 3 mm which is within normal limits. Liver: No focal lesion identified. Within normal limits in parenchymal echogenicity. Portal vein is patent on color Doppler imaging with normal direction of blood flow towards the liver. Other: None. IMPRESSION: Small amount of sludge noted within gallbladder lumen. No other abnormality seen in the right upper quadrant of the abdomen. Electronically Signed   By: Marijo Conception M.D.   On: 01/31/2021 11:26        Scheduled Meds:  Chlorhexidine Gluconate Cloth  6 each Topical Daily   citalopram  10 mg Oral Daily   famotidine  20 mg Oral BID   fluticasone  2 spray Each Nare QHS   ipratropium  0.5 mg Nebulization TID   levothyroxine  50 mcg Oral Q0600   mouth rinse  15 mL Mouth Rinse BID   Continuous Infusions:  sodium chloride     ceFEPime (MAXIPIME) IV       LOS: 2 days    Time spent: 40 min    Desma Maxim, MD [facility]   If 7PM-7AM, please contact night-coverage [email or web address] Password [email or web address] 02/01/2021, 9:15 AM

## 2021-02-01 NOTE — Consult Note (Addendum)
[place] for Warfarin Indication: atrial fibrillation  Patient Measurements: Heparin Dosing Weight: 59 kg  Labs: Recent Labs    01/30/21 1707 01/30/21 2359 01/31/21 0503 01/31/21 0858 01/31/21 1030 01/31/21 1925 02/01/21 0524 02/01/21 0843  HGB 13.2  --  12.7  --   --   --  11.2*  --   HCT 41.4  --  40.2  --   --   --  34.6*  --   PLT 229  --  208  --   --   --  163  --   APTT 45*  --   --   --   --   --   --   --   LABPROT 27.9*  --   --   --  34.1* 33.1*  --  25.1*  INR 2.6*  --   --   --  3.4* 3.3*  --  2.3*  CREATININE 2.24*  --  1.76*  --   --   --  0.94  --   TROPONINIHS 117* 170* 135* 120*  --   --   --   --      Estimated Creatinine Clearance: 32.9 mL/min (by C-G formula based on SCr of 0.94 mg/dL).   Medical History: Past Medical History:  Diagnosis Date   Atrial fibrillation ([place])    Heart failure (HCC)    Hyperlipidemia    Hypertension    Hypokalemia    Osteoarthritis     Medications:  Warfarin 3.5 mg daily (last dose 7/21)  Assessment: Patient is a 87 y/o F with medical history as above and including atrial fibrillation on warfarin who is admitted with sepsis secondary to infected stone. She had emergent cystoscopy with right ureteral stent placement 7/22.   Date INR Plan  7/22 2.6 Warfarin held  7/23 3.4 Warfarin held  7/24 2.3 3.5 mg    Goal of Therapy:  INR 2-3 Monitor platelets by anticoagulation protocol: Yes   Plan:  INR is therapeutic. Will give warfarin 3.5 mg x 1 (home dose). Daily INR ordered. CBC at least every 3 days.   Oswald Hillock, PharmD  02/01/2021,9:40 AM

## 2021-02-02 ENCOUNTER — Encounter: Payer: Self-pay | Admitting: Urology

## 2021-02-02 DIAGNOSIS — R6520 Severe sepsis without septic shock: Secondary | ICD-10-CM | POA: Diagnosis not present

## 2021-02-02 DIAGNOSIS — A419 Sepsis, unspecified organism: Secondary | ICD-10-CM | POA: Diagnosis not present

## 2021-02-02 LAB — BASIC METABOLIC PANEL
Anion gap: 6 (ref 5–15)
BUN: 37 mg/dL — ABNORMAL HIGH (ref 8–23)
CO2: 28 mmol/L (ref 22–32)
Calcium: 8.6 mg/dL — ABNORMAL LOW (ref 8.9–10.3)
Chloride: 105 mmol/L (ref 98–111)
Creatinine, Ser: 0.68 mg/dL (ref 0.44–1.00)
GFR, Estimated: >60 mL/min (ref >60)
Glucose, Bld: 95 mg/dL (ref 70–99)
Potassium: 4.1 mmol/L (ref 3.5–5.1)
Sodium: 139 mmol/L (ref 135–145)

## 2021-02-02 LAB — CBC
HCT: 36.9 % (ref 36.0–46.0)
Hemoglobin: 11.7 g/dL — ABNORMAL LOW (ref 12.0–15.0)
MCH: 31 pg (ref 26.0–34.0)
MCHC: 31.7 g/dL (ref 30.0–36.0)
MCV: 97.6 fL (ref 80.0–100.0)
Platelets: 173 10*3/uL (ref 150–400)
RBC: 3.78 MIL/uL — ABNORMAL LOW (ref 3.87–5.11)
RDW: 13.7 % (ref 11.5–15.5)
WBC: 18.6 10*3/uL — ABNORMAL HIGH (ref 4.0–10.5)
nRBC: 0 % (ref 0.0–0.2)

## 2021-02-02 LAB — URINE CULTURE: Culture: 4000 — AB

## 2021-02-02 LAB — GLUCOSE, CAPILLARY: Glucose-Capillary: 84 mg/dL (ref 70–99)

## 2021-02-02 LAB — PROTIME-INR
INR: 1.8 — ABNORMAL HIGH (ref 0.8–1.2)
Prothrombin Time: 21 seconds — ABNORMAL HIGH (ref 11.4–15.2)

## 2021-02-02 LAB — RESP PANEL BY RT-PCR (FLU A&B, COVID) ARPGX2
Influenza A by PCR: NEGATIVE
Influenza B by PCR: NEGATIVE
SARS Coronavirus 2 by RT PCR: NEGATIVE

## 2021-02-02 MED ORDER — WARFARIN SODIUM 4 MG PO TABS
4.0000 mg | ORAL_TABLET | Freq: Once | ORAL | Status: AC
  Administered 2021-02-02: 4 mg via ORAL
  Filled 2021-02-02: qty 1

## 2021-02-02 MED ORDER — ADULT MULTIVITAMIN W/MINERALS CH: 1.0000 | ORAL_TABLET | Freq: Every day | ORAL | Status: DC

## 2021-02-02 MED ORDER — AMOXICILLIN 500 MG PO CAPS: 1000.0000 mg | ORAL_CAPSULE | Freq: Three times a day (TID) | ORAL | 0 refills | Status: AC

## 2021-02-02 MED ORDER — AMOXICILLIN 500 MG PO CAPS
1000.0000 mg | ORAL_CAPSULE | Freq: Three times a day (TID) | ORAL | Status: DC
  Administered 2021-02-02: 1000 mg via ORAL
  Filled 2021-02-02 (×3): qty 2

## 2021-02-02 MED ORDER — ENSURE ENLIVE PO LIQD
237.0000 mL | Freq: Two times a day (BID) | ORAL | Status: DC
  Administered 2021-02-02: 237 mL via ORAL

## 2021-02-02 NOTE — TOC Transition Note (Signed)
Transition of Care [facility]) - CM/SW Discharge Note   Patient Details  Name: JOANA SANDBOTHE MRN: [id number] Date of Birth: [birth year]  Transition of Care [facility]) CM/SW Contact:  Kerin Salen, RN Phone Number: 02/02/2021, 3:37 PM   Clinical Narrative:  To discharge to [facility], Daughter Maxie Better notified.     Final next level of care: Long Term Acute Care (LTAC) Barriers to Discharge: Barriers Resolved   Patient Goals and CMS Choice     Choice offered to / list presented to : NA  Discharge Placement                Patient to be transferred to facility by: EMS Name of family member notified: Maxie Better Patient and family notified of of transfer: 02/02/21  Discharge Plan and Services                                     Social Determinants of Health (SDOH) Interventions     Readmission Risk Interventions Readmission Risk Prevention Plan 07/17/2019 11/18/2018 11/17/2018  Transportation Screening - Complete Complete  PCP or Specialist Appt within 3-5 Days Complete Complete -  HRI or Home Care Consult - Not Complete Complete  HRI or Home Care Consult comments - Patient returning to SNF. -  Social Work Consult for [place] Planning/Counseling Complete Complete Complete  Palliative Care Screening Not Applicable Not Applicable -  Medication Review Press photographer) - Complete Complete  Some recent data might be hidden

## 2021-02-02 NOTE — Progress Notes (Signed)
Patient discharged at this time via [facility].  No s/s of distress noted denies pain.  No belongings at bedside to go with patient.  Daughter aware of transfer back to [facility] and is waiting at facility for her return.

## 2021-02-02 NOTE — Plan of Care (Signed)
  Problem: Education: Goal: Knowledge of General Education information will improve Description: Including pain rating scale, medication(s)/side effects and non-pharmacologic comfort measures Outcome: Adequate for Discharge   Problem: Health Behavior/Discharge Planning: Goal: Ability to manage health-related needs will improve Outcome: Adequate for Discharge   Problem: Clinical Measurements: Goal: Ability to maintain clinical measurements within normal limits will improve Outcome: Adequate for Discharge Goal: Will remain free from infection Outcome: Adequate for Discharge Goal: Diagnostic test results will improve Outcome: Adequate for Discharge Goal: Respiratory complications will improve Outcome: Adequate for Discharge Goal: Cardiovascular complication will be avoided Outcome: Adequate for Discharge   Problem: Activity: Goal: Risk for activity intolerance will decrease Outcome: Adequate for Discharge   Problem: Nutrition: Goal: Adequate nutrition will be maintained Outcome: Adequate for Discharge   Problem: Coping: Goal: Level of anxiety will decrease Outcome: Adequate for Discharge   Problem: Elimination: Goal: Will not experience complications related to bowel motility Outcome: Adequate for Discharge Goal: Will not experience complications related to urinary retention Outcome: Adequate for Discharge   Problem: Pain Managment: Goal: General experience of comfort will improve Outcome: Adequate for Discharge   Problem: Safety: Goal: Ability to remain free from injury will improve Outcome: Adequate for Discharge   Problem: Skin Integrity: Goal: Risk for impaired skin integrity will decrease Outcome: Adequate for Discharge   Problem: Inadequate Intake (NI-2.1) Goal: Food and/or nutrient delivery Description: Individualized approach for food/nutrient provision. Outcome: Adequate for Discharge

## 2021-02-02 NOTE — NC FL2 (Signed)
[place] LEVEL OF CARE SCREENING TOOL     IDENTIFICATION  Patient Name: Judith Keller Birthdate: Apr 30, 1925 Sex: female Admission Date (Current Location): 01/30/2021  [place] and [place] Number:  Engineering geologist and Address:  [facility], [address], [place], [place] [address]      Provider Number: [id number]  Attending Physician Name and Address:  Gwynne Edinger, MD  Relative Name and Phone Number:  Maxie Better [phone]    Current Level of Care: Hospital Recommended Level of Care: Other (Comment) (Long Term Care) Prior Approval Number:    Date Approved/Denied:   PASRR Number:    Discharge Plan: Other (Comment) ([facility])    Current Diagnoses: Patient Active Problem List   Diagnosis Date Noted   Severe sepsis ([place]) 01/30/2021   AMS (altered mental status) 01/30/2021   HLD (hyperlipidemia) 07/14/2019   Hypothermia 07/14/2019   Leucocytosis 07/14/2019   COVID-19 virus infection 07/14/2019   Acute on chronic respiratory failure with hypoxia ([place]) 07/14/2019   Pressure injury of skin 06/09/2019   CHF (congestive heart failure) ([place]) 06/09/2019   Acute on chronic combined systolic and diastolic CHF (congestive heart failure) ([place]) 06/08/2019   Acute respiratory distress 06/08/2019   Hypoxia 06/08/2019   Elevated brain natriuretic peptide (BNP) level 06/08/2019   Atrial fibrillation, chronic ([place]) 06/08/2019   Polymyalgia rheumatica ([place]) 06/08/2019   CAD (coronary artery disease) 06/08/2019   S/P right hip fracture 11/15/2018   Chronic heart failure with preserved ejection fraction (HFpEF) ([place]) 09/19/2018   Essential (primary) hypertension 12/20/2012    Orientation RESPIRATION BLADDER Height & Weight        O2 (2L NP)   Weight: 59 kg Height:  '5\' 5"'$  (165.1 cm)  BEHAVIORAL SYMPTOMS/MOOD NEUROLOGICAL BOWEL NUTRITION STATUS      Continent Diet  AMBULATORY STATUS COMMUNICATION OF NEEDS Skin    Limited Assist Verbally Normal                       Personal Care Assistance Level of Assistance  Bathing, Feeding, Dressing Bathing Assistance: Limited assistance Feeding assistance: Limited assistance Dressing Assistance: Limited assistance     Functional Limitations Info  Sight, Hearing, Speech Sight Info: Adequate Hearing Info: Adequate Speech Info: Adequate    SPECIAL CARE FACTORS FREQUENCY                       Contractures Contractures Info: Not present    Additional Factors Info  Code Status, Allergies Code Status Info: DNR Allergies Info: None           Current Medications (02/02/2021):  This is the current hospital active medication list Current Facility-Administered Medications  Medication Dose Route Frequency Provider Last Rate Last Admin   0.9 %  sodium chloride infusion  250 mL Intravenous Continuous Bennie Pierini, MD       acetaminophen (TYLENOL) tablet 650 mg  650 mg Oral Q6H PRN Abbie Sons, MD   650 mg at 02/01/21 [id number]   amoxicillin (AMOXIL) capsule 1,000 mg  1,000 mg Oral Q8H Wouk, Ailene Rud, MD   1,000 mg at 02/02/21 1421   Chlorhexidine Gluconate Cloth 2 % PADS 6 each  6 each Topical Daily Rust-Chester, Huel Cote, NP   6 each at 02/02/21 1008   citalopram (CELEXA) tablet 10 mg  10 mg Oral Daily Stoioff, Scott C, MD   10 mg at 02/02/21 1008   famotidine (  PEPCID) tablet 20 mg  20 mg Oral BID Bennie Pierini, MD   20 mg at 02/02/21 1007   feeding supplement (ENSURE ENLIVE / ENSURE PLUS) liquid 237 mL  237 mL Oral BID BM Wouk, Ailene Rud, MD       fluticasone [facility]) 50 MCG/ACT nasal spray 2 spray  2 spray Each Nare QHS Stoioff, Scott C, MD   2 spray at 02/01/21 2155   ipratropium (ATROVENT) nebulizer solution 0.5 mg  0.5 mg Nebulization TID Bennie Pierini, MD   0.5 mg at 02/02/21 1335   levothyroxine (SYNTHROID) tablet 50 mcg  50 mcg Oral Q0600 Abbie Sons, MD   50 mcg at 02/02/21 0539   MEDLINE mouth rinse  15  mL Mouth Rinse BID Rust-Chester, Britton L, NP   15 mL at 02/02/21 1008   [START ON 02/03/2021] multivitamin with minerals tablet 1 tablet  1 tablet Oral Daily Wouk, Ailene Rud, MD       ondansetron [facility]) tablet 8 mg  8 mg Oral Q8H PRN Stoioff, Scott C, MD       predniSONE (DELTASONE) tablet 5 mg  5 mg Oral Q breakfast Wouk, Ailene Rud, MD   5 mg at 02/02/21 [id number]   warfarin (COUMADIN) tablet 4 mg  4 mg Oral ONCE-1600 Wouk, Ailene Rud, MD       Warfarin - Pharmacist Dosing Inpatient   Does not apply q1600 Oswald Hillock, [facility]   1 each at 02/01/21 1640     Discharge Medications: Please see discharge summary for a list of discharge medications.  Relevant Imaging Results:  Relevant Lab Results:   Additional Information    Kerin Salen, RN

## 2021-02-02 NOTE — Progress Notes (Signed)
Initial Nutrition Assessment  DOCUMENTATION CODES:   Not applicable  INTERVENTION:   Ensure Enlive po BID, each supplement provides 350 kcal and 20 grams of protein  Magic cup TID with meals, each supplement provides 290 kcal and 9 grams of protein  MVI po daily   NUTRITION DIAGNOSIS:   Inadequate oral intake related to acute illness as evidenced by other (comment) (pt eating <75% of meals).  GOAL:   Patient will meet greater than or equal to 90% of their needs  MONITOR:   PO intake, Supplement acceptance, Labs, Weight trends, Skin, I & O's  REASON FOR ASSESSMENT:   Malnutrition Screening Tool    ASSESSMENT:   87 y/o female with h/o R mastectomy, Afib, CHF, HTN, HLD, COVID 19 (07/2019) and hip fracture who is admitted with sepsis secondary to pyelonephritis, urinary obstruction and aspiration PNA now s/p cystoscopy and stent placement 7/22.  RD working remotely.  Unable to reach pt by phone. Per chart review, pt documented to have eaten 75% of meals yesterday and pt ate 50% of her breakfast this morning. RD will add supplements and MVI to help pt meet her estimated needs. There is no recent documented weight history in chart to determine if any significant changes; pt does have two documented weights of 140lbs in January 2021 and 152lbs in Sept. 2021. RD will follow up to obtain nutrition related history and exam. Pt is at high risk for malnutrition.   Medications reviewed and include: amoxicillin, celexa, pepcid, synthroid, prednisone, warfarin   Labs reviewed: K 4.1 wnl, BUN 37(H) Wbc- 18.6(H)  NUTRITION - FOCUSED PHYSICAL EXAM: Unable to perform at this time   Diet Order:   Diet Order             DIET SOFT Room service appropriate? Yes; Fluid consistency: Thin  Diet effective now                  EDUCATION NEEDS:   No education needs have been identified at this time  Skin:  Skin Assessment: Reviewed RN Assessment (ecchymosis)  Last BM:   pta  Height:   Ht Readings from Last 1 Encounters:  01/30/21 '5\' 5"'$  (1.651 m)    Weight:   Wt Readings from Last 1 Encounters:  01/30/21 59 kg    Ideal Body Weight:  56.8 kg  BMI:  Body mass index is 21.63 kg/m.  Estimated Nutritional Needs:   Kcal:  1400-1600kcal/day  Protein:  70-80g/day  Fluid:  1.4-1.6L/day  Koleen Distance MS, RD, LDN Please refer to [facility] for RD and/or RD on-call/weekend/after hours pager

## 2021-02-02 NOTE — Discharge Summary (Signed)
Judith Keller [id number] DOB: 04-15-1925 DOA: 01/30/2021  PCP: Harlow Ohms, MD  Admit date: 01/30/2021 Discharge date: 02/02/2021  Time spent: 35 minutes  Recommendations for Outpatient Follow-up:  Outpatient urology f/u 1-2 weeks     Discharge Diagnoses:  Principal Problem:   Severe sepsis [facility]) Active Problems:   Atrial fibrillation, chronic (HCC)   CAD (coronary artery disease)   CHF (congestive heart failure) ([place])   AMS (altered mental status)   Discharge Condition: stable  Diet recommendation: regular  Filed Weights   01/30/21 1650  Weight: 59 kg    History of present illness:  Judith Keller is a 87 y.o. female seen in ed for AMS. Pt at baseline is A/O and this confusion has been going on for about 24 hour and is new.  Also at facility pt was noted to have high white count and was unresponsive to treatment .Daughter is HCPOA and pt has most form and is DNR.HPI is limited due to AMS.PT noted to have vomited twice prior to coming to ER.  Hospital Course:  # Pyelonephritis # Severe sepsis 2/2 obstructing stone, now s/p stent. Required low-dose pressors initially, now weaned off. Sepsis physiology resolved. Culture growing pan-sensitive enterococcus. Foley discontinued and able to void after. - amoxicillin 1 g tid for 14 days per pharm ID - will need urology f/u, urology to arrange   # Obstructive ureteral calculus S/p stent 7/22. - outpt urology f/u   # Chronic hypoxic respiratory failure On o2 at night. Here requiring 1-2 L - wean as able   # Atrial fibrillation - resumed home wararin - home rate controlling meds initially held, re-started     Procedures: Cystoscopy w/ ureteral stent placement   Consultations: urology  Discharge Exam: Vitals:   02/02/21 0800 02/02/21 1010  BP: (!) 142/85   Pulse: (!) 106   Resp: 18   Temp: 98.9 F (37.2 C)   SpO2: 93% (!) 89%    General exam: Appears calm and comfortable Respiratory system: rales at  bases Cardiovascular system: S1 & S2 heard, RRR. No JVD, murmurs, rubs, gallops or clicks. No pedal edema. Gastrointestinal system: Abdomen is nondistended, soft and nontender. No organomegaly or masses felt. Normal bowel sounds heard. Central nervous system: Alert and oriented. No focal neurological deficits. Extremities: Symmetric 5 x 5 power. Skin: No rashes, lesions or ulcers Psychiatry: Judgement and insight appear normal. Mood & affect appropriate.  Discharge Instructions   Discharge Instructions     Diet - low sodium heart healthy   Complete by: As directed    Increase activity slowly   Complete by: As directed       Allergies as of 02/02/2021   No Known Allergies      Medication List     STOP taking these medications    diltiazem 120 MG 24 hr capsule Commonly known as: DILACOR XR   lisinopril 2.5 MG tablet Commonly known as: ZESTRIL   simvastatin 20 MG tablet Commonly known as: ZOCOR   vitamin C 250 MG tablet Commonly known as: ASCORBIC ACID       TAKE these medications    acetaminophen 325 MG tablet Commonly known as: TYLENOL Take 650 mg by mouth every 6 (six) hours as needed for mild pain.   albuterol 108 (90 Base) MCG/ACT inhaler Commonly known as: VENTOLIN HFA Inhale 2 puffs into the lungs every 4 (four) hours as needed for wheezing or shortness of breath.   amoxicillin 500 MG capsule Commonly known  as: AMOXIL Take 2 capsules (1,000 mg total) by mouth every 8 (eight) hours for 14 days.   aspirin EC 81 MG tablet Take 81 mg by mouth daily. Swallow whole.   cholecalciferol 25 MCG (1000 UNIT) tablet Commonly known as: VITAMIN D Take 2,000 Units by mouth daily.   citalopram 10 MG tablet Commonly known as: CELEXA Take 10 mg by mouth daily.   dextromethorphan-guaiFENesin 30-600 MG 12hr tablet Commonly known as: MUCINEX DM Take 1 tablet by mouth 2 (two) times daily.   famotidine 20 MG tablet Commonly known as: PEPCID Take 20 mg by mouth 2  (two) times daily.   fluticasone 50 MCG/ACT nasal spray Commonly known as: FLONASE Place 2 sprays into both nostrils at bedtime.   furosemide 20 MG tablet Commonly known as: LASIX Take 1 tablet (20 mg total) by mouth daily.   ipratropium 17 MCG/ACT inhaler Commonly known as: ATROVENT HFA Inhale 2 puffs into the lungs every 4 (four) hours.   levothyroxine 50 MCG tablet Commonly known as: SYNTHROID Take 50 mcg by mouth daily before breakfast.   melatonin 3 MG Tabs tablet Take 6 mg by mouth at bedtime.   metoprolol succinate 100 MG 24 hr tablet Commonly known as: TOPROL-XL Take 100 mg by mouth daily. Take with or immediately following a meal.   multivitamin with minerals Tabs tablet Take 1 tablet by mouth daily.   mupirocin ointment 2 % Commonly known as: BACTROBAN Apply 1 application topically 3 (three) times daily.   nystatin cream Commonly known as: MYCOSTATIN Apply 1 application topically 2 (two) times daily.   ondansetron 4 MG tablet Commonly known as: ZOFRAN Take 4 mg by mouth every 8 (eight) hours as needed for nausea/vomiting.   polyethylene glycol 17 g packet Commonly known as: MIRALAX / GLYCOLAX Take 17 g by mouth daily.   potassium chloride 10 MEQ tablet Commonly known as: KLOR-CON Take 1 tablet (10 mEq total) by mouth daily.   predniSONE 5 MG tablet Commonly known as: DELTASONE Take 5 mg by mouth daily with breakfast.   warfarin 2.5 MG tablet Commonly known as: COUMADIN Take 2.5 mg by mouth every evening.   warfarin 1 MG tablet Commonly known as: COUMADIN Take 1 mg by mouth daily. Take along with one 2.5 mg tablet for total 3.5 mg once daily       ASK your doctor about these medications    traMADol 50 MG tablet Commonly known as: ULTRAM Take 1 tablet (50 mg total) by mouth every 6 (six) hours as needed for moderate pain.       No Known Allergies    The results of significant diagnostics from this hospitalization (including imaging,  microbiology, ancillary and laboratory) are listed below for reference.    Significant Diagnostic Studies: CT Head Wo Contrast  Result Date: 01/30/2021 CLINICAL DATA:  Nausea/vomiting EXAM: CT HEAD WITHOUT CONTRAST TECHNIQUE: Contiguous axial images were obtained from the base of the skull through the vertex without intravenous contrast. COMPARISON:  CT head 11/15/2018 FINDINGS: Brain: Cerebral ventricle sizes are concordant with the degree of cerebral volume loss. Patchy and confluent areas of decreased attenuation are noted throughout the deep and periventricular white matter of the cerebral hemispheres bilaterally, compatible with chronic microvascular ischemic disease. No evidence of large-territorial acute infarction. No parenchymal hemorrhage. No mass lesion. No extra-axial collection. No mass effect or midline shift. No hydrocephalus. Basilar cisterns are patent. Vascular: No hyperdense vessel. Atherosclerotic calcifications are present within the cavernous internal carotid arteries. Skull: No  acute fracture or focal lesion. Sinuses/Orbits: Paranasal sinuses and mastoid air cells are clear. Bilateral lens replacement. Otherwise orbits are unremarkable. Other: None. IMPRESSION: No acute intracranial abnormality. Electronically Signed   By: Iven Finn M.D.   On: 01/30/2021 17:39   US RENAL  Result Date: 01/31/2021 CLINICAL DATA:  Elevated AST. EXAM: RENAL / URINARY TRACT ULTRASOUND COMPLETE COMPARISON:  None. FINDINGS: Right Kidney: Renal measurements: 12.3 x 4.6 x 4.5 cm = volume: 134 mL. Echogenicity within normal limits. No mass or hydronephrosis visualized. Left Kidney: Renal measurements: 10.7 x 5.3 x 5.1 cm = volume: 153 mL. Echogenicity within normal limits. No mass or hydronephrosis visualized. Bladder: Decompressed secondary to Foley catheter. Other: None. IMPRESSION: No definite renal abnormality noted. Electronically Signed   By: Marijo Conception M.D.   On: 01/31/2021 10:12   DG Chest  Port 1 View  Result Date: 01/30/2021 CLINICAL DATA:  Sepsis. EXAM: PORTABLE CHEST 1 VIEW COMPARISON:  July 14, 2019. FINDINGS: Stable cardiomegaly. Mild central pulmonary vascular congestion is noted. No pneumothorax is noted. Right lung is clear. Mild left basilar atelectasis or infiltrate is noted with probable associated effusion. Bony thorax is unremarkable. IMPRESSION: Mild cardiomegaly with central pulmonary vascular congestion. Mild left basilar atelectasis or infiltrate with probable associated pleural effusion. Aortic Atherosclerosis (ICD10-I70.0). Electronically Signed   By: Marijo Conception M.D.   On: 01/30/2021 17:23   DG OR UROLOGY CYSTO IMAGE ([facility] ONLY)  Result Date: 01/30/2021 There is no interpretation for this exam.  This order is for images obtained during a surgical procedure.  Please See "Surgeries" Tab for more information regarding the procedure.   ECHOCARDIOGRAM COMPLETE BUBBLE STUDY  Result Date: 01/31/2021    ECHOCARDIOGRAM REPORT   Patient Name:   Judith Keller Date of Exam: 01/31/2021 Medical Rec #:  [id number]    Height:       65.0 in Accession #:    [id number]   Weight:       130.0 lb Date of Birth:  1924/07/19     BSA:          1.647 m Patient Age:    54 years     BP:           114/71 mmHg Patient Gender: F            HR:           101 bpm. Exam Location:  [facility] Procedure: 2D Echo, Saline Contrast Bubble Study and Intracardiac Opacification            Agent Indications:     Elevated Troponin  History:         Patient has prior history of Echocardiogram examinations. CHF,                  CAD, Arrythmias:Atrial Fibrillation; Risk Factors:Dyslipidemia.  Sonographer:     L Thornton-Maynard Referring Phys:  [id number] ADAM ROSS [place] Diagnosing Phys: Dorris Carnes MD  Sonographer Comments: Suboptimal apical window. IMPRESSIONS  1. Poor acoustic windows limit study.  2. Left ventricular ejection fraction, by estimation, is 55 to 60%. The left ventricle has normal function. There is mild  left ventricular hypertrophy. Left ventricular diastolic parameters are indeterminate.  3. Right ventricular systolic function is moderately reduced. The right ventricular size is mildly enlarged. There is moderately elevated pulmonary artery systolic pressure.  4. No obvious shunt across intraatrial septum as tested with injection of agitated saline Difficult images/acoustic windows limit study however.  5. Right  atrial size was severely dilated.  6. The mitral valve is normal in structure. Mild mitral valve regurgitation.  7. The aortic valve is tricuspid. Aortic valve regurgitation is not visualized. Mild aortic valve sclerosis is present, with no evidence of aortic valve stenosis.  8. The inferior vena cava is dilated in size with >50% respiratory variability, suggesting right atrial pressure of 8 mmHg. FINDINGS  Left Ventricle: Left ventricular ejection fraction, by estimation, is 55 to 60%. The left ventricle has normal function. Definity contrast agent was given IV to delineate the left ventricular endocardial borders. The left ventricular internal cavity size was normal in size. There is mild left ventricular hypertrophy. Left ventricular diastolic parameters are indeterminate. Right Ventricle: Compared to report of 2020, RV dysfunction are still present. The right ventricular size is mildly enlarged. Right vetricular wall thickness was not assessed. Right ventricular systolic function is moderately reduced. There is moderately  elevated pulmonary artery systolic pressure. The tricuspid regurgitant velocity is 3.16 m/s, and with an assumed right atrial pressure of 10 mmHg, the estimated right ventricular systolic pressure is [id number] mmHg. Left Atrium: Left atrial size was normal in size. Right Atrium: Right atrial size was severely dilated. Pericardium: Trivial pericardial effusion is present. Mitral Valve: The mitral valve is normal in structure. Mild mitral valve regurgitation. Tricuspid Valve: The tricuspid  valve is grossly normal. Tricuspid valve regurgitation is mild. Aortic Valve: The aortic valve is tricuspid. Aortic valve regurgitation is not visualized. Mild aortic valve sclerosis is present, with no evidence of aortic valve stenosis. Aortic valve peak gradient measures 3.9 mmHg. Pulmonic Valve: The pulmonic valve was normal in structure. Pulmonic valve regurgitation is not visualized. Aorta: The aortic root is normal in size and structure. Venous: The inferior vena cava is dilated in size with greater than 50% respiratory variability, suggesting right atrial pressure of 8 mmHg. IAS/Shunts: Agitated saline contrast was given intravenously to evaluate for intracardiac shunting.  LEFT VENTRICLE PLAX 2D LVIDd:         4.23 cm  Diastology LVIDs:         2.66 cm  LV e' medial:    6.41 cm/s LV PW:         1.32 cm  LV E/e' medial:  15.0 LV IVS:        1.28 cm  LV e' lateral:   6.49 cm/s LVOT diam:     1.70 cm  LV E/e' lateral: 14.8 LV SV:         18 LV SV Index:   11 LVOT Area:     2.27 cm  RIGHT VENTRICLE RV S prime:     6.49 cm/s TAPSE (M-mode): 1.0 cm LEFT ATRIUM           Index LA diam:      3.70 cm 2.25 cm/m LA Vol (A2C): 35.7 ml 21.67 ml/m LA Vol (A4C): 66.1 ml 40.13 ml/m  AORTIC VALVE                PULMONIC VALVE AV Area (Vmax): 0.95 cm    PV Vmax:       0.45 m/s AV Vmax:        98.90 cm/s  PV Peak grad:  0.8 mmHg AV Peak Grad:   3.9 mmHg LVOT Vmax:      41.30 cm/s LVOT Vmean:     31.100 cm/s LVOT VTI:       0.079 m  AORTA Ao Root diam: 3.00 cm MITRAL VALVE  TRICUSPID VALVE MV Area (PHT): 5.66 cm    TR Peak grad:   39.9 mmHg MV Decel Time: 134 msec    TR Vmax:        316.00 cm/s MV E velocity: 96.00 cm/s                            SHUNTS                            Systemic VTI:  0.08 m                            Systemic Diam: 1.70 cm Dorris Carnes MD Electronically signed by Dorris Carnes MD Signature Date/Time: 01/31/2021/4:45:03 PM    Final    CT CHEST ABDOMEN PELVIS WO CONTRAST  Result Date:  01/30/2021 CLINICAL DATA:  Nausea/vomiting; blood work abnormal EXAM: CT CHEST, ABDOMEN AND PELVIS WITHOUT CONTRAST TECHNIQUE: Multidetector CT imaging of the chest, abdomen and pelvis was performed following the standard protocol without IV contrast. COMPARISON:  June 08, 2019 FINDINGS: Evaluation is limited secondary to lack of IV contrast and arms down positioning. CT CHEST FINDINGS Cardiovascular: Cardiomegaly. Three-vessel coronary artery atherosclerotic calcifications. No pericardial effusion. Atherosclerotic calcifications of the aorta. Mediastinum/Nodes: Thyroid is unremarkable. No axillary adenopathy. No mediastinal adenopathy. Lungs/Pleura: Evaluation is limited secondary to respiratory motion. Revisualization of perihilar predominant ground-glass opacities, overall mildly improved in comparison to prior. This likely reflects the sequela of prior COVID-19 infection. There is a more focal irregular opacity of the RIGHT lower lobe which measures 17 by 16 mm (series 4, image 94 Musculoskeletal: Thoracolumbar scoliosis. Evaluation for rib fractures limited due to respiratory motion. Status post RIGHT mastectomy. CT ABDOMEN PELVIS FINDINGS Hepatobiliary: The gallbladder is distended. No definitive adjacent pericholecystic fat stranding as adjacent stranding along the inferior posterior aspect is favored to be secondary to renal pathology. Unremarkable noncontrast appearance of the liver. Pancreas: No peripancreatic fat stranding. Spleen: Unremarkable Adrenals/Urinary Tract: Adrenal glands are unremarkable. There is mild to moderate RIGHT-sided hydroureteronephrosis. Evaluation of the pelvis is limited by streak artifact from adjacent hip arthroplasty but there is a 2 mm radiopaque density at the region of the RIGHT UVJ which is favored to reflect an obstructing ureterolithiasis. There is exuberant adjacent fat stranding along the RIGHT kidney. No LEFT-sided hydronephrosis. Bladder is poorly assessed.  Stomach/Bowel: No evidence of bowel obstruction. Diverticulosis. No evidence of appendicitis. Vascular/Lymphatic: Atherosclerotic calcifications of the aorta. No suspicious lymphadenopathy. Reproductive: Poor visualization of the pelvis secondary to streak artifact. Other: No free air. Musculoskeletal: Thoracolumbar scoliosis.  RIGHT hip arthroplasty. IMPRESSION: 1. There is mild to moderate RIGHT-sided hydroureteronephrosis which is favored to be secondary to a 2 mm ureterolithiasis at the RIGHT UVJ. There is extensive adjacent fat stranding. Recommend correlation with urine analysis to evaluate for superimposed infection. 2. The gallbladder is distended; fat stranding along the inferior posterior margin is favored to be due to renal pathology. Recommend correlation with liver function tests and physical exam. 3. 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 There is a more focal irregular opacity of the RIGHT lower lobe which may reflect infection or atelectasis. Consider follow-up CT in 6 months to assess for resolution. Aortic Atherosclerosis (ICD10-I70.0). Electronically Signed   By: Valentino Saxon MD   On: 01/30/2021 17:58   US Abdomen Limited RUQ (LIVER/GB)  Result Date: 01/31/2021 CLINICAL DATA:  Elevated  AST level. EXAM: ULTRASOUND ABDOMEN LIMITED RIGHT UPPER QUADRANT COMPARISON:  None. FINDINGS: Gallbladder: No gallstones or wall thickening visualized. No sonographic Murphy sign noted by sonographer. Small amount of sludge is noted within gallbladder lumen. Common bile duct: Diameter: 3 mm which is within normal limits. Liver: No focal lesion identified. Within normal limits in parenchymal echogenicity. Portal vein is patent on color Doppler imaging with normal direction of blood flow towards the liver. Other: None. IMPRESSION: Small amount of sludge noted within gallbladder lumen. No other abnormality seen in the right upper quadrant of the abdomen. Electronically Signed   By: Marijo Conception  M.D.   On: 01/31/2021 11:26    Microbiology: Recent Results (from the past 240 hour(s))  Resp Panel by RT-PCR (Flu A&B, Covid) Nasopharyngeal Swab     Status: None   Collection Time: 01/30/21  5:07 PM   Specimen: Nasopharyngeal Swab; Nasopharyngeal(NP) swabs in vial transport medium  Result Value Ref Range Status   SARS Coronavirus 2 by RT PCR NEGATIVE NEGATIVE Final    Comment: (NOTE) SARS-CoV-2 target nucleic acids are NOT DETECTED.  The SARS-CoV-2 RNA is generally detectable in upper respiratory specimens during the acute phase of infection. The lowest concentration of SARS-CoV-2 viral copies this assay can detect is 138 copies/mL. A negative result does not preclude SARS-Cov-2 infection and should not be used as the sole basis for treatment or other patient management decisions. A negative result may occur with  improper specimen collection/handling, submission of specimen other than nasopharyngeal swab, presence of viral mutation(s) within the areas targeted by this assay, and inadequate number of viral copies(<138 copies/mL). A negative result must be combined with clinical observations, patient history, and epidemiological information. The expected result is Negative.  Fact Sheet for Patients:  EntrepreneurPulse.com.au  Fact Sheet for Healthcare Providers:  IncredibleEmployment.be  This test is no t yet approved or cleared by the [place] FDA and  has been authorized for detection and/or diagnosis of SARS-CoV-2 by FDA under an Emergency Use Authorization (EUA). This EUA will remain  in effect (meaning this test can be used) for the duration of the COVID-19 declaration under Section 564(b)(1) of the Act, 21 U.S.C.section 360bbb-3(b)(1), unless the authorization is terminated  or revoked sooner.       Influenza A by PCR NEGATIVE NEGATIVE Final   Influenza B by PCR NEGATIVE NEGATIVE Final    Comment: (NOTE) The Xpert Xpress  SARS-CoV-2/FLU/RSV plus assay is intended as an aid in the diagnosis of influenza from Nasopharyngeal swab specimens and should not be used as a sole basis for treatment. Nasal washings and aspirates are unacceptable for Xpert Xpress SARS-CoV-2/FLU/RSV testing.  Fact Sheet for Patients: EntrepreneurPulse.com.au  Fact Sheet for Healthcare Providers: IncredibleEmployment.be  This test is not yet approved or cleared by the [place] FDA and has been authorized for detection and/or diagnosis of SARS-CoV-2 by FDA under an Emergency Use Authorization (EUA). This EUA will remain in effect (meaning this test can be used) for the duration of the COVID-19 declaration under Section 564(b)(1) of the Act, 21 U.S.C. section 360bbb-3(b)(1), unless the authorization is terminated or revoked.  Performed at [facility], [address] [place], [place] [address]   Blood Culture (routine x 2)     Status: None (Preliminary result)   Collection Time: 01/30/21  5:07 PM   Specimen: BLOOD  Result Value Ref Range Status   Specimen Description BLOOD BLOOD RIGHT FOREARM  Final   Special Requests   Final  BOTTLES DRAWN AEROBIC AND ANAEROBIC Blood Culture results may not be optimal due to an inadequate volume of blood received in culture bottles   Culture   Final    NO GROWTH 3 DAYS Performed at [facility], [address] [place], [place] [address]    Report Status PENDING  Incomplete  Blood Culture (routine x 2)     Status: None (Preliminary result)   Collection Time: 01/30/21  5:07 PM   Specimen: BLOOD  Result Value Ref Range Status   Specimen Description BLOOD BLOOD LEFT WRIST  Final   Special Requests   Final    BOTTLES DRAWN AEROBIC AND ANAEROBIC Blood Culture adequate volume   Culture   Final    NO GROWTH 3 DAYS Performed at [facility], [address] [place], [place] [address]    Report Status PENDING  Incomplete   Urine Culture     Status: Abnormal   Collection Time: 01/30/21  6:53 PM   Specimen: In/Out Cath Urine  Result Value Ref Range Status   Specimen Description   Final    IN/OUT CATH URINE Performed at [facility], [address] [place], [place] [address]    Special Requests   Final    NONE Performed at [facility], [address] [place], [place] [address]    Culture 4,000 COLONIES/mL ENTEROCOCCUS FAECALIS (A)  Final   Report Status 02/02/2021 FINAL  Final   Organism ID, Bacteria ENTEROCOCCUS FAECALIS (A)  Final      Susceptibility   Enterococcus faecalis - MIC*    AMPICILLIN <=2 SENSITIVE Sensitive     NITROFURANTOIN <=16 SENSITIVE Sensitive     VANCOMYCIN 2 SENSITIVE Sensitive     * 4,000 COLONIES/mL ENTEROCOCCUS FAECALIS  Urine Culture     Status: None   Collection Time: 01/30/21  9:20 PM   Specimen: Urine, Random  Result Value Ref Range Status   Specimen Description   Final    URINE, RANDOM Performed at [facility], [address] [place], [place] [address]    Special Requests   Final    NONE Performed at [facility], [address] [place], [place] [address]    Culture   Final    NO GROWTH Performed at [facility], [place] [address], [place], [place] [address]    Report Status 02/01/2021 FINAL  Final  MRSA Next Gen by PCR, Nasal     Status: None   Collection Time: 01/31/21 12:21 AM   Specimen: Nasal Mucosa; Nasal Swab  Result Value Ref Range Status   MRSA by PCR Next Gen NOT DETECTED NOT DETECTED Final    Comment: (NOTE) The GeneXpert MRSA Assay (FDA approved for NASAL specimens only), is one component of a comprehensive MRSA colonization surveillance program. It is not intended to diagnose MRSA infection nor to guide or monitor treatment for MRSA infections. Test performance is not FDA approved in patients less than 37 years old. Performed at [facility], [address] [place], [place] [address]       Labs: Basic Metabolic Panel: Recent Labs  Lab 01/30/21 1707 01/31/21 0503 01/31/21 0858 02/01/21 0524 02/02/21 0538  NA 140 140  --  138 139  K 4.8 4.8  --  4.0 4.1  CL 105 106  --  104 105  CO2 24 25  --  26 28  GLUCOSE 111* 147*  --  130* 95  BUN 39* 40*  --  43* 37*  CREATININE 2.24* 1.76*  --  0.94 0.68  CALCIUM 8.2* 7.8*  --  8.4* 8.6*  MG  --  1.6* 2.2 1.9  --   PHOS  --  4.6  --   --   --    Liver Function Tests: Recent Labs  Lab 01/30/21 1707 01/31/21 0503  AST 55* 43*  ALT 41 34  ALKPHOS 74 71  BILITOT 1.0 1.0  PROT 6.2* 5.8*  ALBUMIN 3.6 3.2*   No results for input(s): LIPASE, AMYLASE in the last 168 hours. No results for input(s): AMMONIA in the last 168 hours. CBC: Recent Labs  Lab 01/30/21 1707 01/31/21 0503 02/01/21 0524 02/02/21 0538  WBC 38.6* 31.6* 23.6* 18.6*  NEUTROABS 34.7*  --  21.7*  --   HGB 13.2 12.7 11.2* 11.7*  HCT 41.4 40.2 34.6* 36.9  MCV 98.3 97.6 96.1 97.6  PLT 229 208 163 173   Cardiac Enzymes: No results for input(s): CKTOTAL, CKMB, CKMBINDEX, TROPONINI in the last 168 hours. BNP: BNP (last 3 results) Recent Labs    01/31/21 0149  BNP 821.8*    ProBNP (last 3 results) No results for input(s): PROBNP in the last 8760 hours.  CBG: Recent Labs  Lab 01/30/21 2334 02/02/21 0738  GLUCAP 113* 84       Signed:  Desma Maxim MD.  [facility] 02/02/2021, 3:21 PM

## 2021-02-02 NOTE — Consult Note (Signed)
Louise for Warfarin Indication: atrial fibrillation  Patient Measurements: Heparin Dosing Weight: 59 kg  Labs: Recent Labs    01/30/21 1707 01/30/21 2359 01/31/21 0503 01/31/21 0858 01/31/21 1030 01/31/21 1925 02/01/21 0524 02/01/21 0843 02/02/21 0538  HGB 13.2  --  12.7  --   --   --  11.2*  --  11.7*  HCT 41.4  --  40.2  --   --   --  34.6*  --  36.9  PLT 229  --  208  --   --   --  163  --  173  APTT 45*  --   --   --   --   --   --   --   --   LABPROT 27.9*  --   --   --    < > 33.1*  --  25.1* 21.0*  INR 2.6*  --   --   --    < > 3.3*  --  2.3* 1.8*  CREATININE 2.24*  --  1.76*  --   --   --  0.94  --  0.68  TROPONINIHS 117* 170* 135* 120*  --   --   --   --   --    < > = values in this interval not displayed.     Estimated Creatinine Clearance: 38.7 mL/min (by C-G formula based on SCr of 0.68 mg/dL).   Medical History: Past Medical History:  Diagnosis Date   Atrial fibrillation ([place])    Heart failure (HCC)    Hyperlipidemia    Hypertension    Hypokalemia    Osteoarthritis     Medications:  Warfarin 3.5 mg daily (last dose 7/21)  Assessment: Patient is a 87 y/o F with medical history as above and including atrial fibrillation on warfarin who is admitted with sepsis secondary to infected stone. She had emergent cystoscopy with right ureteral stent placement 7/22.   Date INR Plan  7/22 2.6 Warfarin held  7/23 3.4 Warfarin held  7/24 2.3 3.5 mg  7/25 1.8     Goal of Therapy:  INR 2-3 Monitor platelets by anticoagulation protocol: Yes   Plan:  INR subtherapeutic, likely s/t having held warfarin. Warfarin 4 mg ordered for today. Daily INR ordered. CBC at least every 3 days.   Tawnya Crook, PharmD, BCPS  02/02/2021,7:40 AM

## 2021-02-02 NOTE — Progress Notes (Signed)
Report called to Admitting RN at [facility].  All questions answered and they are ready for Judith Keller to return.  Informed that EMS should be here around 5pm and that writer would call as soon as they are leaving.

## 2021-02-03 ENCOUNTER — Encounter: Payer: Self-pay | Admitting: Urology

## 2021-02-03 MED ORDER — 0.9 % SODIUM CHLORIDE (POUR BTL) OPTIME
TOPICAL | Status: DC | PRN
  Administered 2021-02-03: 1000 mL

## 2021-02-04 LAB — CULTURE, BLOOD (ROUTINE X 2)
Culture: NO GROWTH
Culture: NO GROWTH
Special Requests: ADEQUATE

## 2021-02-19 ENCOUNTER — Other Ambulatory Visit: Payer: Self-pay

## 2021-02-19 ENCOUNTER — Telehealth: Payer: Self-pay

## 2021-02-19 DIAGNOSIS — N201 Calculus of ureter: Secondary | ICD-10-CM

## 2021-02-19 NOTE — Telephone Encounter (Signed)
Spoke with Judith Keller and agreed on surgery date of 03/10/21. Patient will come in for a pre-op visit on 02/26/21 to meet with Dr. Bernardo Heater. PAT appt given. Surgical instructions given (NPO, driver, etc) Ok to continue anticoags per Dr. Dene Gentry orders.

## 2021-02-19 NOTE — Telephone Encounter (Signed)
Called Patient's daughter Opal Sidles (listed as POA under media) left a message to call back in regards to scheduling surgery for patient.  Right URS w/ stone removal per Dr. Bernardo Heater, possible surgery date of 03/10/21

## 2021-02-24 ENCOUNTER — Telehealth: Payer: Self-pay

## 2021-02-24 NOTE — Telephone Encounter (Signed)
Incoming call from Johny Blamer, nurse at [facility] who states that the PA there Faith wanted to make [place] aware that the patient is having intermittent RT sided pain, hematuria, and frequent urination. Pt has an appt this week on 02/26/21. Advised nurse that it is unlikely we will be able to see pt sooner, but would make provider aware.

## 2021-02-24 NOTE — Telephone Encounter (Signed)
Recommend ED visit for worsening symptoms or fever

## 2021-02-26 ENCOUNTER — Ambulatory Visit (INDEPENDENT_AMBULATORY_CARE_PROVIDER_SITE_OTHER): Payer: Medicare Other | Admitting: Urology

## 2021-02-26 ENCOUNTER — Encounter: Payer: Self-pay | Admitting: Urology

## 2021-02-26 ENCOUNTER — Other Ambulatory Visit: Payer: Self-pay

## 2021-02-26 VITALS — BP 128/80 | HR 92 | Ht 65.0 in | Wt 161.0 lb

## 2021-02-26 DIAGNOSIS — N201 Calculus of ureter: Secondary | ICD-10-CM | POA: Diagnosis not present

## 2021-02-26 NOTE — Progress Notes (Signed)
02/26/2021 1:26 PM   Selinda Orion December 28, 1924 [id number]  Referring provider: Harlow Ohms, MD No address on file  Chief Complaint  Patient presents with   Pre-op Exam    HPI: 87 y.o. female presents for preop visit.  Admitted [facility] 01/30/2021 with severe sepsis secondary to a 2 mm right distal ureteral calculus Underwent emergent stent placement with significant clinical improvement and was discharged 02/02/2021 Scheduled for cystoscopy with stent removal, right ureteroscopy 03/10/2021 Has done well since discharge and no specific complaints No fever or chills   PMH: Past Medical History:  Diagnosis Date   Atrial fibrillation ([place])    Heart failure ([place])    Hyperlipidemia    Hypertension    Hypokalemia    Osteoarthritis     Surgical History: Past Surgical History:  Procedure Laterality Date   CYSTOSCOPY WITH STENT PLACEMENT Right 01/30/2021   Procedure: CYSTOSCOPY WITH STENT PLACEMENT;  Surgeon: Abbie Sons, MD;  Location: [facility] ORS;  Service: Urology;  Laterality: Right;   FOOT SURGERY     HIP ARTHROPLASTY Right 11/16/2018   Procedure: ARTHROPLASTY BIPOLAR HIP (HEMIARTHROPLASTY);  Surgeon: Lovell Sheehan, MD;  Location: [facility] ORS;  Service: Orthopedics;  Laterality: Right;   MASTECTOMY Right     Home Medications:  Allergies as of 02/26/2021   No Known Allergies      Medication List        Accurate as of February 26, 2021  1:26 PM. If you have any questions, ask your nurse or doctor.          acetaminophen 325 MG tablet Commonly known as: TYLENOL Take 650 mg by mouth every 6 (six) hours as needed for mild pain.   albuterol 108 (90 Base) MCG/ACT inhaler Commonly known as: VENTOLIN HFA Inhale 2 puffs into the lungs every 4 (four) hours as needed for wheezing or shortness of breath.   aspirin EC 81 MG tablet Take 81 mg by mouth daily. Swallow whole.   atorvastatin 20 MG tablet Commonly known as: LIPITOR Take 20 mg by mouth daily.    cholecalciferol 25 MCG (1000 UNIT) tablet Commonly known as: VITAMIN D Take 2,000 Units by mouth daily.   citalopram 10 MG tablet Commonly known as: CELEXA Take 10 mg by mouth daily.   dextromethorphan-guaiFENesin 30-600 MG 12hr tablet Commonly known as: MUCINEX DM Take 1 tablet by mouth 2 (two) times daily.   famotidine 20 MG tablet Commonly known as: PEPCID Take 20 mg by mouth 2 (two) times daily.   fluticasone 50 MCG/ACT nasal spray Commonly known as: FLONASE Place 2 sprays into both nostrils at bedtime.   furosemide 20 MG tablet Commonly known as: LASIX Take 1 tablet (20 mg total) by mouth daily.   ipratropium 17 MCG/ACT inhaler Commonly known as: ATROVENT HFA Inhale 2 puffs into the lungs every 4 (four) hours.   levothyroxine 50 MCG tablet Commonly known as: SYNTHROID Take 50 mcg by mouth daily before breakfast.   melatonin 3 MG Tabs tablet Take 6 mg by mouth at bedtime.   metoprolol succinate 100 MG 24 hr tablet Commonly known as: TOPROL-XL Take 100 mg by mouth daily. Take with or immediately following a meal.   multivitamin with minerals Tabs tablet Take 1 tablet by mouth daily.   mupirocin ointment 2 % Commonly known as: BACTROBAN Apply 1 application topically 3 (three) times daily.   nystatin cream Commonly known as: MYCOSTATIN Apply 1 application topically 2 (two) times daily.   ondansetron 4 MG  tablet Commonly known as: ZOFRAN Take 4 mg by mouth every 8 (eight) hours as needed for nausea/vomiting.   polyethylene glycol 17 g packet Commonly known as: MIRALAX / GLYCOLAX Take 17 g by mouth daily.   potassium chloride 10 MEQ tablet Commonly known as: KLOR-CON Take 1 tablet (10 mEq total) by mouth daily.   predniSONE 5 MG tablet Commonly known as: DELTASONE Take 5 mg by mouth daily with breakfast.   traMADol 50 MG tablet Commonly known as: ULTRAM Take 1 tablet (50 mg total) by mouth every 6 (six) hours as needed for moderate pain.    warfarin 2.5 MG tablet Commonly known as: COUMADIN Take 2.5 mg by mouth every evening.   warfarin 1 MG tablet Commonly known as: COUMADIN Take 1 mg by mouth daily. Take along with one 2.5 mg tablet for total 3.5 mg once daily        Allergies: No Known Allergies  Family History: Family History  Problem Relation Age of Onset   Vision loss Mother    Aneurysm Father     Social History:  reports that she has never smoked. She has never used smokeless tobacco. She reports that she does not currently use alcohol. She reports that she does not use drugs.   Physical Exam: BP 128/80   Pulse 92   Ht '5\' 5"'$  (1.651 m)   Wt 161 lb (73 kg)   BMI 26.79 kg/m   Constitutional:  Alert and oriented, No acute distress. HEENT: [place] AT, moist mucus membranes.  Trachea midline, no masses. Cardiovascular: RRR Respiratory: Clear GI: Abdomen is soft, nontender, nondistended, no abdominal masses GU: No CVA tenderness Lymph: No cervical or inguinal lymphadenopathy. Skin: No rashes, bruises or suspicious lesions. Neurologic: Grossly intact, no focal deficits, moving all 4 extremities. Psychiatric: Normal mood and affect.  Laboratory Data:  Urinalysis Dipstick 3+ blood/1+ leukocyte/nitrite negative Microscopy 6-10 WBC/11-30 RBC   Assessment & Plan:    1. Right distal ureteral calculus 2 mm right distal ureteral calculus status post stent placement for severe sepsis Based on stone size and location it is possible that the stone has passed Will plan cystoscopy with stent removal and right ureteroscopy with possible stone removal The procedure has been discussed in detail including potential risks bleeding, infection/sepsis and ureteral injury Due to the stone size and location it is possible she may not need a stent postoperatively She will not need to stop [place], MD  [facility] [address] [place], [place] [address] [phone]

## 2021-02-26 NOTE — H&P (View-Only) (Signed)
02/26/2021 1:26 PM   Judith Keller 1924/08/22 [id number]  Referring provider: Harlow Ohms, MD No address on file  Chief Complaint  Patient presents with   Pre-op Exam    HPI: 87 y.o. female presents for preop visit.  Admitted [facility] 01/30/2021 with severe sepsis secondary to a 2 mm right distal ureteral calculus Underwent emergent stent placement with significant clinical improvement and was discharged 02/02/2021 Scheduled for cystoscopy with stent removal, right ureteroscopy 03/10/2021 Has done well since discharge and no specific complaints No fever or chills   PMH: Past Medical History:  Diagnosis Date   Atrial fibrillation ([place])    Heart failure ([place])    Hyperlipidemia    Hypertension    Hypokalemia    Osteoarthritis     Surgical History: Past Surgical History:  Procedure Laterality Date   CYSTOSCOPY WITH STENT PLACEMENT Right 01/30/2021   Procedure: CYSTOSCOPY WITH STENT PLACEMENT;  Surgeon: Abbie Sons, MD;  Location: [facility] ORS;  Service: Urology;  Laterality: Right;   FOOT SURGERY     HIP ARTHROPLASTY Right 11/16/2018   Procedure: ARTHROPLASTY BIPOLAR HIP (HEMIARTHROPLASTY);  Surgeon: Lovell Sheehan, MD;  Location: [facility] ORS;  Service: Orthopedics;  Laterality: Right;   MASTECTOMY Right     Home Medications:  Allergies as of 02/26/2021   No Known Allergies      Medication List        Accurate as of February 26, 2021  1:26 PM. If you have any questions, ask your nurse or doctor.          acetaminophen 325 MG tablet Commonly known as: TYLENOL Take 650 mg by mouth every 6 (six) hours as needed for mild pain.   albuterol 108 (90 Base) MCG/ACT inhaler Commonly known as: VENTOLIN HFA Inhale 2 puffs into the lungs every 4 (four) hours as needed for wheezing or shortness of breath.   aspirin EC 81 MG tablet Take 81 mg by mouth daily. Swallow whole.   atorvastatin 20 MG tablet Commonly known as: LIPITOR Take 20 mg by mouth daily.    cholecalciferol 25 MCG (1000 UNIT) tablet Commonly known as: VITAMIN D Take 2,000 Units by mouth daily.   citalopram 10 MG tablet Commonly known as: CELEXA Take 10 mg by mouth daily.   dextromethorphan-guaiFENesin 30-600 MG 12hr tablet Commonly known as: MUCINEX DM Take 1 tablet by mouth 2 (two) times daily.   famotidine 20 MG tablet Commonly known as: PEPCID Take 20 mg by mouth 2 (two) times daily.   fluticasone 50 MCG/ACT nasal spray Commonly known as: FLONASE Place 2 sprays into both nostrils at bedtime.   furosemide 20 MG tablet Commonly known as: LASIX Take 1 tablet (20 mg total) by mouth daily.   ipratropium 17 MCG/ACT inhaler Commonly known as: ATROVENT HFA Inhale 2 puffs into the lungs every 4 (four) hours.   levothyroxine 50 MCG tablet Commonly known as: SYNTHROID Take 50 mcg by mouth daily before breakfast.   melatonin 3 MG Tabs tablet Take 6 mg by mouth at bedtime.   metoprolol succinate 100 MG 24 hr tablet Commonly known as: TOPROL-XL Take 100 mg by mouth daily. Take with or immediately following a meal.   multivitamin with minerals Tabs tablet Take 1 tablet by mouth daily.   mupirocin ointment 2 % Commonly known as: BACTROBAN Apply 1 application topically 3 (three) times daily.   nystatin cream Commonly known as: MYCOSTATIN Apply 1 application topically 2 (two) times daily.   ondansetron 4 MG  tablet Commonly known as: ZOFRAN Take 4 mg by mouth every 8 (eight) hours as needed for nausea/vomiting.   polyethylene glycol 17 g packet Commonly known as: MIRALAX / GLYCOLAX Take 17 g by mouth daily.   potassium chloride 10 MEQ tablet Commonly known as: KLOR-CON Take 1 tablet (10 mEq total) by mouth daily.   predniSONE 5 MG tablet Commonly known as: DELTASONE Take 5 mg by mouth daily with breakfast.   traMADol 50 MG tablet Commonly known as: ULTRAM Take 1 tablet (50 mg total) by mouth every 6 (six) hours as needed for moderate pain.    warfarin 2.5 MG tablet Commonly known as: COUMADIN Take 2.5 mg by mouth every evening.   warfarin 1 MG tablet Commonly known as: COUMADIN Take 1 mg by mouth daily. Take along with one 2.5 mg tablet for total 3.5 mg once daily        Allergies: No Known Allergies  Family History: Family History  Problem Relation Age of Onset   Vision loss Mother    Aneurysm Father     Social History:  reports that she has never smoked. She has never used smokeless tobacco. She reports that she does not currently use alcohol. She reports that she does not use drugs.   Physical Exam: BP 128/80   Pulse 92   Ht '5\' 5"'$  (1.651 m)   Wt 161 lb (73 kg)   BMI 26.79 kg/m   Constitutional:  Alert and oriented, No acute distress. HEENT: [place] AT, moist mucus membranes.  Trachea midline, no masses. Cardiovascular: RRR Respiratory: Clear GI: Abdomen is soft, nontender, nondistended, no abdominal masses GU: No CVA tenderness Lymph: No cervical or inguinal lymphadenopathy. Skin: No rashes, bruises or suspicious lesions. Neurologic: Grossly intact, no focal deficits, moving all 4 extremities. Psychiatric: Normal mood and affect.  Laboratory Data:  Urinalysis Dipstick 3+ blood/1+ leukocyte/nitrite negative Microscopy 6-10 WBC/11-30 RBC   Assessment & Plan:    1. Right distal ureteral calculus 2 mm right distal ureteral calculus status post stent placement for severe sepsis Based on stone size and location it is possible that the stone has passed Will plan cystoscopy with stent removal and right ureteroscopy with possible stone removal The procedure has been discussed in detail including potential risks bleeding, infection/sepsis and ureteral injury Due to the stone size and location it is possible she may not need a stent postoperatively She will not need to stop [place], MD  [facility] [address] [place], [place] [address] [phone]

## 2021-02-27 ENCOUNTER — Other Ambulatory Visit
Admission: RE | Admit: 2021-02-27 | Discharge: 2021-02-27 | Disposition: A | Discharge status: Home / Self Care | Payer: Medicare Other | Source: Ambulatory Visit | Attending: Urology | Admitting: Urology

## 2021-02-27 HISTORY — DX: Hypothyroidism, unspecified: E03.9

## 2021-02-27 HISTORY — DX: Gastro-esophageal reflux disease without esophagitis: K21.9

## 2021-02-27 HISTORY — DX: Pneumonia, unspecified organism: J18.9

## 2021-02-27 HISTORY — DX: COVID-19: U07.1

## 2021-02-27 HISTORY — DX: Malignant (primary) neoplasm, unspecified: C80.1

## 2021-02-27 HISTORY — DX: Personal history of urinary calculi: Z87.442

## 2021-02-27 HISTORY — DX: Depression, unspecified: F32.A

## 2021-02-27 LAB — URINALYSIS, COMPLETE
Bilirubin, UA: NEGATIVE
Glucose, UA: NEGATIVE
Ketones, UA: NEGATIVE
Nitrite, UA: NEGATIVE
Protein,UA: NEGATIVE
Specific Gravity, UA: 1.015 (ref 1.005–1.030)
Urobilinogen, Ur: 0.2 mg/dL (ref 0.2–1.0)
pH, UA: 5 (ref 5.0–7.5)

## 2021-02-27 LAB — MICROSCOPIC EXAMINATION

## 2021-02-27 NOTE — Patient Instructions (Addendum)
Your procedure is scheduled on: 03/10/21 - Tuesday Report to the Registration Desk on the 1st floor of the [facility]. To find out your arrival time, please call [phone] between 1PM - 3PM on: 03/09/21 - Tuesday  REMEMBER: Instructions that are not followed completely may result in serious medical risk, up to and including death; or upon the discretion of your surgeon and anesthesiologist your surgery may need to be rescheduled.  Do not eat food or drink any fluid after midnight the night before surgery.  No gum chewing, lozengers or hard candies.   TAKE THESE MEDICATIONS THE MORNING OF SURGERY WITH A SIP OF WATER:  - famotidine (PEPCID) 20 MG tablet, take one the night before and one on the morning of surgery - helps to prevent nausea after surgery. - ipratropium (ATROVENT HFA) 17 MCG/ACT inhaler - levothyroxine (SYNTHROID) 50 MCG tablet - metoprolol succinate (TOPROL-XL) 100 MG 24 hr tablet - predniSONE (DELTASONE) 5 MG tablet  Use albuterol (VENTOLIN HFA) 108 (90 Base) MCG/ACT inhaler on the day of surgery and bring to the hospital.  Follow recommendations from Cardiologist, Pulmonologist or PCP regarding stopping Aspirin, Coumadin, Plavix, Eliquis, Pradaxa, or Pletal. Patient will continue  medications per Dr. Bernardo Heater.  One week prior to surgery: Stop Anti-inflammatories (NSAIDS) such as Advil, Aleve, Ibuprofen, Motrin, Naproxen, Naprosyn and Aspirin based products such as Excedrin, Goodys Powder, BC Powder.  Stop ANY OVER THE COUNTER supplements until after surgery.  You may however, continue to take Tylenol if needed for pain up until the day of surgery.  No Alcohol for 24 hours before or after surgery.  No Smoking including e-cigarettes for 24 hours prior to surgery.  No chewable tobacco products for at least 6 hours prior to surgery.  No nicotine patches on the day of surgery.  Do not use any "recreational" drugs for at least a week prior to your surgery.  Please  be advised that the combination of cocaine and anesthesia may have negative outcomes, up to and including death. If you test positive for cocaine, your surgery will be cancelled.  On the morning of surgery brush your teeth with toothpaste and water, you may rinse your mouth with mouthwash if you wish. Do not swallow any toothpaste or mouthwash.  Do not wear jewelry, make-up, hairpins, clips or nail polish.  Do not wear lotions, powders, or perfumes.   Do not shave body from the neck down 48 hours prior to surgery just in case you cut yourself which could leave a site for infection.  Also, freshly shaved skin may become irritated if using the CHG soap.  Contact lenses, hearing aids and dentures may not be worn into surgery.  Do not bring valuables to the hospital. [facility] is not responsible for any missing/lost belongings or valuables.   Notify your doctor if there is any change in your medical condition (cold, fever, infection).  Wear comfortable clothing (specific to your surgery type) to the hospital.  After surgery, you can help prevent lung complications by doing breathing exercises.  Take deep breaths and cough every 1-2 hours. Your doctor may order a device called an Incentive Spirometer to help you take deep breaths. When coughing or sneezing, hold a pillow firmly against your incision with both hands. This is called "splinting." Doing this helps protect your incision. It also decreases belly discomfort.  If you are being admitted to the hospital overnight, leave your suitcase in the car. After surgery it may be brought to your room.  If you are being discharged the day of surgery, you will not be allowed to drive home. You will need a responsible adult (18 years or older) to drive you home and stay with you that night.   If you are taking public transportation, you will need to have a responsible adult (18 years or older) with you. Please confirm with your physician that  it is acceptable to use public transportation.   Please call the [facility]. at [phone] if you have any questions about these instructions.  Surgery Visitation Policy:  Patients undergoing a surgery or procedure may have one family member or support person with them as long as that person is not COVID-19 positive or experiencing its symptoms.  That person may remain in the waiting area during the procedure.  Inpatient Visitation:    Visiting hours are 7 a.m. to 8 p.m. Inpatients will be allowed two visitors daily. The visitors may change each day during the patient's stay. No visitors under the age of 20. Any visitor under the age of 36 must be accompanied by an adult. The visitor must pass COVID-19 screenings, use hand sanitizer when entering and exiting the patient's room and wear a mask at all times, including in the patient's room. Patients must also wear a mask when staff or their visitor are in the room. Masking is required regardless of vaccination status.

## 2021-03-01 LAB — CULTURE, URINE COMPREHENSIVE

## 2021-03-02 ENCOUNTER — Telehealth: Payer: Self-pay

## 2021-03-02 NOTE — Telephone Encounter (Signed)
Returned [employer], she inquired about patient's surgery time. Number for arrival time the day before surgery was given as a reference. She verbalized understanding

## 2021-03-02 NOTE — Telephone Encounter (Signed)
Incoming message on triage VM from Opal Sidles (daughter) who would like to speak with Judson Roch in regards to upcoming surgery. She can be reached at [phone].

## 2021-03-05 ENCOUNTER — Encounter: Payer: Self-pay | Admitting: Urology

## 2021-03-05 NOTE — Progress Notes (Addendum)
Perioperative Services  Pre-Admission/Anesthesia Testing Clinical Review  Date: 03/05/21  Patient Demographics:  Name: Judith Keller DOB:   08-26-1924 MRN:   [id number]  Planned Surgical Procedure(s):    Case: [id number] Date/Time: 03/10/21 1345   Procedures:      RIGHT URETEROSCOPY WITH STONE EXTRACTION WITH BASKET (Right)     POSSIBLE CYSTOSCOPY/URETEROSCOPY/HOLMIUM LASER/STENT PLACEMENT (Right) - POSSIBLE   Anesthesia type: Choice   Pre-op diagnosis: Right Distal Ureteral Calculus   Location: [facility] OR ROOM 10 / [place] [facility]   Surgeons: Abbie Sons, MD     NOTE: Available PAT nursing documentation and vital signs have been reviewed. Clinical nursing staff has updated patient's PMH/PSHx, current medication list, and drug allergies/intolerances to ensure comprehensive history available to assist in medical decision making as it pertains to the aforementioned surgical procedure and anticipated anesthetic course. Extensive review of available clinical information performed. Judith Keller PMH and PSHx updated with any diagnoses/procedures that  may have been inadvertently omitted during her intake with the pre-admission testing department's nursing staff.  Clinical Discussion:  Judith Keller is a 87 y.o. female who is submitted for pre-surgical anesthesia review and clearance prior to her undergoing the above procedure. Patient has never been a smoker.  Pertinent PMH includes: HFrEF, atrial fibrillation, palpitations, valvular heart disease, biatrial enlargement, aortic atherosclerosis, HTN, HLD, hypothyroidism, GERD (on daily H2 blocker), OA, polymyalgia rheumatica, breast cancer, sleep difficulties (uses melatonin).  Patient is followed by cardiology Ubaldo Glassing, MD). She was last seen in the cardiology clinic on 01/07/2020; notes reviewed.  At the time of her clinic visit, patient doing well overall from a cardiovascular perspective.  She denied any episodes of chest pain, shortness  breath, PND, orthopnea, palpitations, significant peripheral edema, vertiginous symptoms, or presyncope/syncope.  PMH significant for cardiovascular diagnoses.  TTE performed on 03/15/2015 revealed normal left ventricular systolic function with an EF of >55%.  There was mild mitral valve regurgitation.  There was mild biatrial enlargement.  Aortic valve noted sclerotic.  There was no evidence of aortic or mitral valve stenosis.  TTE performed on 06/08/2019 revealed a moderately reduced left ventricular systolic function with a EF of 40-45%.  There was mild LVH.  Diastolic parameters consistent with impaired relaxation (G1DD).  There was mild to moderate left ventricular cavity dilatation.  There was a moderate fixed thrombus on the lateral wall of the left ventricle.  Moderate biatrial enlargement once again demonstrated.  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  PASP moderately elevated.  TTE with bubble study performed on 01/31/2021 revealed a normal left ventricular systolic function with an LVEF of 55-60%.  There was no intra-atrial shunting noted.  Right atrium severely dilated.  There was mild mitral valve regurgitation.  PMH significant for significant for atrial fibrillation. CHA2DS2-VASc Score = 5 (age x 2, sex, HTN, aortic plaque). Patient chronically anticoagulated using daily warfarin and low-dose ASA; compliant with therapy with no evidence of GI bleeding. Rate and rhythm controlled on extended release beta-blocker therapy. Blood pressure well controlled at 122/70 on currently prescribed diuretic and beta blocker therapies. Patient is on a statin for her HLD. She is not diabetic. Functional capacity limited by age-related debility; able to achieve </= 4 METS of activity.  No changes were made to her medication regimen. Patient scheduled to follow-up with outpatient cardiology in 6 months or sooner if needed.  Patient is scheduled to undergo ureteroscopy with  stone extraction versus possible cystoscopy/ureteroscopy with use of holmium  laser and stent placement on 03/10/2021 with Dr. John Giovanni, MD.  Given patient's past medical history significant for cardiovascular disease, presurgical cardiac clearance was sought by the PAT team. Per cardiology, "this patient is optimized for surgery and may proceed with the planned procedural course with a LOW risk of significant perioperative cardiovascular complications".  Again, this patient is on daily anticoagulation and antiplatelet therapies.  She has been instructed on recommendations for holding her daily warfarin dose for 5 days prior to her procedure with plans to restart as soon as postoperative bleeding risk felt to be minimized by her primary attending surgeon.  Patient to continue her low-dose ASA throughout the perioperative period.  Patient resides in a SNF. Signed ordered sent to [facility] regarding anticoagulant and noting that last dose of warfarin should be taken on 03/04/2021. Cardiology did not indicate the need for a perioperative enoxaparin bridge for this patient.   Patient denies previous perioperative complications with anesthesia in the past. In review of the available records, it is noted that patient underwent a general anesthetic course here (ASA III) in 01/2021 without documented complications.   Vitals with BMI 02/26/2021 02/02/2021 02/02/2021  Height '5\' 5"'$  - -  Weight 161 lbs - -  BMI [id number] - -  Systolic 0000000 [id number]  Diastolic 80 88 72  Pulse 92 102 -    Providers/Specialists:   NOTE: Primary physician provider listed below. Patient may have been seen by APP or partner within same practice.   PROVIDER ROLE / SPECIALTY LAST Claud Kelp, MD Urology (Surgeon) 02/26/2021  Kizer, Gwynn Burly, MD Primary Care Provider ???  Bartholome Bill, MD Cardiology 04/08/2020   Allergies:  Patient has no known allergies.  Current Home Medications:   No current facility-administered  medications for this encounter.    acetaminophen (TYLENOL) 325 MG tablet   albuterol (VENTOLIN HFA) 108 (90 Base) MCG/ACT inhaler   aspirin EC 81 MG tablet   atorvastatin (LIPITOR) 20 MG tablet   cholecalciferol (VITAMIN D) 25 MCG (1000 UT) tablet   citalopram (CELEXA) 10 MG tablet   dextromethorphan-guaiFENesin (MUCINEX DM) 30-600 MG 12hr tablet   famotidine (PEPCID) 20 MG tablet   fluticasone (FLONASE) 50 MCG/ACT nasal spray   furosemide (LASIX) 20 MG tablet   ipratropium (ATROVENT HFA) 17 MCG/ACT inhaler   levothyroxine (SYNTHROID) 50 MCG tablet   Melatonin 3 MG TABS   metoprolol succinate (TOPROL-XL) 100 MG 24 hr tablet   Multiple Vitamin (MULTIVITAMIN WITH MINERALS) TABS tablet   nystatin cream (MYCOSTATIN)   ondansetron (ZOFRAN) 4 MG tablet   polyethylene glycol (MIRALAX / GLYCOLAX) 17 g packet   potassium chloride (KLOR-CON) 10 MEQ tablet   predniSONE (DELTASONE) 5 MG tablet   traMADol (ULTRAM) 50 MG tablet   warfarin (COUMADIN) 1 MG tablet   warfarin (COUMADIN) 2.5 MG tablet   History:   Past Medical History:  Diagnosis Date   Aortic atherosclerosis (HCC)    Atrial fibrillation (HCC)    Biatrial enlargement    Breast cancer (HCC) 1984   Chronic osteomyelitis of right ankle (HCC)    Corneal dystrophy    Current use of long term anticoagulation    Warfarin + ASA   Depression    GERD (gastroesophageal reflux disease)    HFrEF (heart failure with reduced ejection fraction) (HCC)    History of 2019 novel coronavirus disease (COVID-19) 06/09/2019   History of kidney stones    Hyperlipidemia    Hypertension    Hypokalemia  Hypothyroidism    Osteoarthritis    Palpitations    PMR (polymyalgia rheumatica) (HCC)    Pneumonia    Sleep difficulties    takes melatonin at bedtime   Valvular heart disease    Past Surgical History:  Procedure Laterality Date   COLONOSCOPY     CYSTOSCOPY WITH STENT PLACEMENT Right 01/30/2021   Procedure: CYSTOSCOPY WITH STENT  PLACEMENT;  Surgeon: Abbie Sons, MD;  Location: [facility] ORS;  Service: Urology;  Laterality: Right;   FOOT SURGERY     HIP ARTHROPLASTY Right 11/16/2018   Procedure: ARTHROPLASTY BIPOLAR HIP (HEMIARTHROPLASTY);  Surgeon: Lovell Sheehan, MD;  Location: [facility] ORS;  Service: Orthopedics;  Laterality: Right;   MASTECTOMY Right    Family History  Problem Relation Age of Onset   Vision loss Mother    Aneurysm Father    Social History   Tobacco Use   Smoking status: Never   Smokeless tobacco: Never  Vaping Use   Vaping Use: Never used  Substance Use Topics   Alcohol use: Not Currently   Drug use: Never    Pertinent Clinical Results:  LABS: Labs reviewed: Acceptable for surgery.  Lab Results  Component Value Date   WBC 18.6 (H) 02/02/2021   HGB 11.7 (L) 02/02/2021   HCT 36.9 02/02/2021   MCV 97.6 02/02/2021   PLT 173 02/02/2021   Lab Results  Component Value Date   NA 139 02/02/2021   K 4.1 02/02/2021   CO2 28 02/02/2021   GLUCOSE 95 02/02/2021   BUN 37 (H) 02/02/2021   CREATININE 0.68 02/02/2021   CALCIUM 8.6 (L) 02/02/2021   GFRNONAA >60 02/02/2021   GFRAA >60 07/17/2019   No visits with results within 3 Day(s) from this visit.  Latest known visit with results is:  Office Visit on 02/26/2021  Component Date Value Ref Range Status   Specific Gravity, UA 02/26/2021 1.015  1.005 - 1.030 Final   pH, UA 02/26/2021 5.0  5.0 - 7.5 Final   Color, UA 02/26/2021 Yellow  Yellow Final   Appearance Ur 02/26/2021 Hazy (A) Clear Final   Leukocytes,UA 02/26/2021 1+ (A) Negative Final   Protein,UA 02/26/2021 Negative  Negative/Trace Final   Glucose, UA 02/26/2021 Negative  Negative Final   Ketones, UA 02/26/2021 Negative  Negative Final   RBC, UA 02/26/2021 3+ (A) Negative Final   Bilirubin, UA 02/26/2021 Negative  Negative Final   Urobilinogen, Ur 02/26/2021 0.2  0.2 - 1.0 mg/dL Final   Nitrite, UA 02/26/2021 Negative  Negative Final   Microscopic Examination 02/26/2021 See  below:   Final   Urine Culture, Comprehensive 02/26/2021 Final report   Final   Organism ID, Bacteria 02/26/2021 Comment   Final   Comment: Mixed urogenital flora 5,000  Colonies/mL    WBC, UA 02/26/2021 6-10 (A) 0 - 5 /hpf Final   RBC 02/26/2021 11-30 (A) 0 - 2 /hpf Final   Epithelial Cells (non renal) 02/26/2021 0-10  0 - 10 /hpf Final   Mucus, UA 02/26/2021 Present (A) Not Estab. Final   Bacteria, UA 02/26/2021 Moderate (A) None seen/Few Final    ECG: Date: 01/30/2021 Time ECG obtained: 1800 PM Rate: 95 bpm Rhythm: atrial fibrillation Axis (leads I and aVF): Right axis deviation Intervals: QRS 97 ms. QTc 476 ms. ST segment and T wave changes: No evidence of acute ST segment elevation or depression.  Evidence of an age undetermined anteroseptal infarct normal. Comparison: Similar to previous tracing obtained on 06/02/2018   IMAGING /  PROCEDURES: ECHOCARDIOGRAM COMPLETE BUBBLE STUDY WITH IMAGING ENHANCEMENT performed on 01/31/2021 Poor acoustic windows limit study.  Left ventricular ejection fraction, by estimation, is 55 to 60%. The left ventricle has normal function. There is mild left ventricular  hypertrophy. Left ventricular diastolic parameters are indeterminate.  Right ventricular systolic function is moderately reduced. The right ventricular size is mildly enlarged. There is moderately elevated pulmonary artery systolic pressure.  No obvious shunt across intraatrial septum as tested with injection of agitated saline Difficult images/acoustic windows limit study however. Right atrial size was severely dilated.  The mitral valve is normal in structure. Mild mitral valve regurgitation.  The aortic valve is tricuspid. Aortic valve regurgitation is not visualized. Mild aortic valve sclerosis is present, with no evidence of  aortic valve stenosis.  The inferior vena cava is dilated in size with >50% respiratory variability, suggesting right atrial pressure of 8 mmHg.    TRANSTHORACIC ECHOCARDIOGRAM performed on 06/08/2019 Left ventricular ejection fraction, by visual estimation, is 40 to 45%. The left ventricle has moderately decreased function. There is mildly increased left ventricular hypertrophy.  Left ventricular diastolic parameters are consistent with Grade I diastolic dysfunction (impaired relaxation).  Mild to moderately dilated left ventricular internal cavity size.  Moderate, fixed thrombus on the lateral wall of the left ventricle.  Global right ventricle has mildly reduced systolic function.The right ventricular size is mildly enlarged. Mildly increased right ventricular  wall thickness.  Left atrial size was mild-moderately dilated.  Right atrial size was moderately dilated.  Trivial pericardial effusion is present.  The mitral valve is myxomatous. Moderate to severe mitral valve regurgitation.  The tricuspid valve is grossly normal. Tricuspid valve regurgitation mild-moderate.  The aortic valve was not well visualized. Aortic valve regurgitation is not visualized.  The pulmonic valve was normal in structure. Pulmonic valve regurgitation is mild.  The aortic root was not well visualized.  Moderately elevated pulmonary artery systolic pressure.  The interatrial septum was not assessed.   Impression and Plan:  Judith Keller has been referred for pre-anesthesia review and clearance prior to her undergoing the planned anesthetic and procedural courses. Available labs, pertinent testing, and imaging results were personally reviewed by me. This patient has been appropriately cleared by cardiology with an overall LOW risk of significant perioperative cardiovascular complications.  Based on clinical review performed today (03/05/21), barring any significant acute changes in the patient's overall condition, it is anticipated that she will be able to proceed with the planned surgical intervention. Any acute changes in clinical condition may necessitate  her procedure being postponed and/or cancelled. Patient will meet with anesthesia team (MD and/or CRNA) on the day of her procedure for preoperative evaluation/assessment. Questions regarding anesthetic course will be fielded at that time.   Pre-surgical instructions were reviewed with the patient during her PAT appointment and questions were fielded by PAT clinical staff. Patient was advised that if any questions or concerns arise prior to her procedure then she should return a call to PAT and/or her surgeon's office to discuss.  Honor Loh, MSN, APRN, FNP-C, CEN [facility]  Peri-operative Services Nurse Practitioner Phone: [phone] Fax: [phone] 03/05/21 1:01 PM  NOTE: This note has been prepared using Dragon dictation software. Despite my best ability to proofread, there is always the potential that unintentional transcriptional errors may still occur from this process.

## 2021-03-09 MED ORDER — CEFAZOLIN SODIUM-DEXTROSE 2-4 GM/100ML-% IV SOLN
2.0000 g | INTRAVENOUS | Status: AC
  Administered 2021-03-10: 2 g via INTRAVENOUS

## 2021-03-10 ENCOUNTER — Ambulatory Visit: Payer: Medicare Other | Admitting: Urgent Care

## 2021-03-10 ENCOUNTER — Encounter
Admission: RE | Disposition: A | Discharge status: Home / Self Care | Payer: Self-pay | Source: Home / Self Care | Attending: Urology

## 2021-03-10 ENCOUNTER — Encounter: Payer: Self-pay | Admitting: Urology

## 2021-03-10 ENCOUNTER — Ambulatory Visit
Admission: RE | Admit: 2021-03-10 | Discharge: 2021-03-10 | Disposition: A | Discharge status: Home / Self Care | Payer: Medicare Other | Attending: Urology | Admitting: Urology

## 2021-03-10 ENCOUNTER — Ambulatory Visit: Payer: Medicare Other

## 2021-03-10 ENCOUNTER — Other Ambulatory Visit: Payer: Self-pay

## 2021-03-10 DIAGNOSIS — E785 Hyperlipidemia, unspecified: Secondary | ICD-10-CM | POA: Insufficient documentation

## 2021-03-10 DIAGNOSIS — Z96641 Presence of right artificial hip joint: Secondary | ICD-10-CM | POA: Insufficient documentation

## 2021-03-10 DIAGNOSIS — Z79890 Hormone replacement therapy (postmenopausal): Secondary | ICD-10-CM | POA: Insufficient documentation

## 2021-03-10 DIAGNOSIS — I4891 Unspecified atrial fibrillation: Secondary | ICD-10-CM | POA: Insufficient documentation

## 2021-03-10 DIAGNOSIS — Z87442 Personal history of urinary calculi: Secondary | ICD-10-CM | POA: Insufficient documentation

## 2021-03-10 DIAGNOSIS — Z466 Encounter for fitting and adjustment of urinary device: Secondary | ICD-10-CM | POA: Diagnosis not present

## 2021-03-10 DIAGNOSIS — Z79899 Other long term (current) drug therapy: Secondary | ICD-10-CM | POA: Diagnosis not present

## 2021-03-10 DIAGNOSIS — M353 Polymyalgia rheumatica: Secondary | ICD-10-CM | POA: Insufficient documentation

## 2021-03-10 DIAGNOSIS — I5020 Unspecified systolic (congestive) heart failure: Secondary | ICD-10-CM | POA: Diagnosis not present

## 2021-03-10 DIAGNOSIS — M1990 Unspecified osteoarthritis, unspecified site: Secondary | ICD-10-CM | POA: Insufficient documentation

## 2021-03-10 DIAGNOSIS — Z7982 Long term (current) use of aspirin: Secondary | ICD-10-CM | POA: Insufficient documentation

## 2021-03-10 DIAGNOSIS — Z8616 Personal history of COVID-19: Secondary | ICD-10-CM | POA: Insufficient documentation

## 2021-03-10 DIAGNOSIS — K219 Gastro-esophageal reflux disease without esophagitis: Secondary | ICD-10-CM | POA: Diagnosis not present

## 2021-03-10 DIAGNOSIS — Z9011 Acquired absence of right breast and nipple: Secondary | ICD-10-CM | POA: Insufficient documentation

## 2021-03-10 DIAGNOSIS — E039 Hypothyroidism, unspecified: Secondary | ICD-10-CM | POA: Insufficient documentation

## 2021-03-10 DIAGNOSIS — Z7952 Long term (current) use of systemic steroids: Secondary | ICD-10-CM | POA: Diagnosis not present

## 2021-03-10 DIAGNOSIS — I110 Hypertensive heart disease with heart failure: Secondary | ICD-10-CM | POA: Diagnosis not present

## 2021-03-10 DIAGNOSIS — A419 Sepsis, unspecified organism: Secondary | ICD-10-CM | POA: Diagnosis not present

## 2021-03-10 DIAGNOSIS — Z853 Personal history of malignant neoplasm of breast: Secondary | ICD-10-CM | POA: Diagnosis not present

## 2021-03-10 DIAGNOSIS — N201 Calculus of ureter: Secondary | ICD-10-CM

## 2021-03-10 DIAGNOSIS — N1330 Unspecified hydronephrosis: Secondary | ICD-10-CM | POA: Insufficient documentation

## 2021-03-10 HISTORY — DX: Unspecified hereditary corneal dystrophies, unspecified eye: H18.509

## 2021-03-10 HISTORY — DX: Atherosclerosis of aorta: I70.0

## 2021-03-10 HISTORY — DX: Other chronic osteomyelitis, right ankle and foot: M86.671

## 2021-03-10 HISTORY — PX: STONE EXTRACTION WITH BASKET: SHX5318

## 2021-03-10 HISTORY — DX: Sleep disorder, unspecified: G47.9

## 2021-03-10 HISTORY — DX: Palpitations: R00.2

## 2021-03-10 HISTORY — DX: Long term (current) use of anticoagulants: Z79.01

## 2021-03-10 HISTORY — DX: Unspecified systolic (congestive) heart failure: I50.20

## 2021-03-10 HISTORY — DX: Polymyalgia rheumatica: M35.3

## 2021-03-10 HISTORY — DX: Cardiomegaly: I51.7

## 2021-03-10 HISTORY — DX: Endocarditis, valve unspecified: I38

## 2021-03-10 LAB — POCT I-STAT, CHEM 8
BUN: 19 mg/dL (ref 8–23)
Calcium, Ion: 1.13 mmol/L — ABNORMAL LOW (ref 1.15–1.40)
Chloride: 105 mmol/L (ref 98–111)
Creatinine, Ser: 0.7 mg/dL (ref 0.44–1.00)
Glucose, Bld: 103 mg/dL — ABNORMAL HIGH (ref 70–99)
HCT: 37 % (ref 36.0–46.0)
Hemoglobin: 12.6 g/dL (ref 12.0–15.0)
Potassium: 3.9 mmol/L (ref 3.5–5.1)
Sodium: 140 mmol/L (ref 135–145)
TCO2: 26 mmol/L (ref 22–32)

## 2021-03-10 LAB — PROTIME-INR
INR: 2.4 — ABNORMAL HIGH (ref 0.8–1.2)
Prothrombin Time: 26 seconds — ABNORMAL HIGH (ref 11.4–15.2)

## 2021-03-10 SURGERY — ERCP, WITH LITHROTRIPSY OR REMOVAL OF COMMON BILE DUCT CALCULUS USING BASKET
Anesthesia: General | Laterality: Right

## 2021-03-10 MED ORDER — IOHEXOL 180 MG/ML  SOLN
INTRAMUSCULAR | Status: DC | PRN
  Administered 2021-03-10: 20 mL

## 2021-03-10 MED ORDER — CHLORHEXIDINE GLUCONATE 0.12 % MT SOLN
OROMUCOSAL | Status: AC
  Administered 2021-03-10: 15 mL via OROMUCOSAL
  Filled 2021-03-10: qty 15

## 2021-03-10 MED ORDER — PROPOFOL 10 MG/ML IV BOLUS
INTRAVENOUS | Status: AC
  Filled 2021-03-10: qty 20

## 2021-03-10 MED ORDER — PHENYLEPHRINE HCL (PRESSORS) 10 MG/ML IV SOLN
INTRAVENOUS | Status: DC | PRN
Start: 2021-03-10 — End: 2021-03-10
  Administered 2021-03-10 (×2): 100 ug via INTRAVENOUS

## 2021-03-10 MED ORDER — PROPOFOL 10 MG/ML IV BOLUS
INTRAVENOUS | Status: DC | PRN
  Administered 2021-03-10: 20 mg via INTRAVENOUS
  Administered 2021-03-10: 60 mg via INTRAVENOUS

## 2021-03-10 MED ORDER — ORAL CARE MOUTH RINSE: 15.0000 mL | Freq: Once | OROMUCOSAL | Status: AC

## 2021-03-10 MED ORDER — CHLORHEXIDINE GLUCONATE 0.12 % MT SOLN: 15.0000 mL | Freq: Once | OROMUCOSAL | Status: AC

## 2021-03-10 MED ORDER — DEXAMETHASONE SODIUM PHOSPHATE 10 MG/ML IJ SOLN
INTRAMUSCULAR | Status: DC | PRN
  Administered 2021-03-10: 4 mg via INTRAVENOUS

## 2021-03-10 MED ORDER — FENTANYL CITRATE (PF) 100 MCG/2ML IJ SOLN
INTRAMUSCULAR | Status: AC
  Filled 2021-03-10: qty 2

## 2021-03-10 MED ORDER — LIDOCAINE HCL (CARDIAC) PF 100 MG/5ML IV SOSY
PREFILLED_SYRINGE | INTRAVENOUS | Status: DC | PRN
  Administered 2021-03-10: 50 mg via INTRAVENOUS

## 2021-03-10 MED ORDER — LACTATED RINGERS IV SOLN: INTRAVENOUS | Status: DC

## 2021-03-10 MED ORDER — 0.9 % SODIUM CHLORIDE (POUR BTL) OPTIME
TOPICAL | Status: DC | PRN
  Administered 2021-03-10: 250 mL

## 2021-03-10 MED ORDER — CEFAZOLIN SODIUM-DEXTROSE 2-4 GM/100ML-% IV SOLN
INTRAVENOUS | Status: AC
  Filled 2021-03-10: qty 100

## 2021-03-10 MED ORDER — ONDANSETRON HCL 4 MG/2ML IJ SOLN
INTRAMUSCULAR | Status: DC | PRN
  Administered 2021-03-10: 4 mg via INTRAVENOUS

## 2021-03-10 MED ORDER — FENTANYL CITRATE (PF) 100 MCG/2ML IJ SOLN
INTRAMUSCULAR | Status: DC | PRN
  Administered 2021-03-10 (×2): 25 ug via INTRAVENOUS

## 2021-03-10 SURGICAL SUPPLY — 31 items
BAG DRAIN CYSTO-URO LG1000N (MISCELLANEOUS) ×2 IMPLANT
BASKET ZERO TIP 1.9FR (BASKET) IMPLANT
BRUSH SCRUB EZ 1% IODOPHOR (MISCELLANEOUS) ×2 IMPLANT
BSKT STON RTRVL ZERO TP 1.9FR (BASKET)
CATH URET FLEX-TIP 2 LUMEN 10F (CATHETERS) IMPLANT
CATH URETL OPEN 5X70 (CATHETERS) IMPLANT
CNTNR SPEC 2.5X3XGRAD LEK (MISCELLANEOUS)
CONT SPEC 4OZ STER OR WHT (MISCELLANEOUS)
CONT SPEC 4OZ STRL OR WHT (MISCELLANEOUS)
CONTAINER SPEC 2.5X3XGRAD LEK (MISCELLANEOUS) IMPLANT
DRAPE UTILITY 15X26 TOWEL STRL (DRAPES) ×2 IMPLANT
GAUZE 4X4 16PLY ~~LOC~~+RFID DBL (SPONGE) ×4 IMPLANT
GLOVE SURG UNDER POLY LF SZ7.5 (GLOVE) ×2 IMPLANT
GOWN STRL REUS W/ TWL LRG LVL3 (GOWN DISPOSABLE) ×1 IMPLANT
GOWN STRL REUS W/ TWL XL LVL3 (GOWN DISPOSABLE) ×1 IMPLANT
GOWN STRL REUS W/TWL LRG LVL3 (GOWN DISPOSABLE) ×2
GOWN STRL REUS W/TWL XL LVL3 (GOWN DISPOSABLE) ×2
GUIDEWIRE STR DUAL SENSOR (WIRE) ×2 IMPLANT
INFUSOR MANOMETER BAG 3000ML (MISCELLANEOUS) ×2 IMPLANT
IV NS IRRIG 3000ML ARTHROMATIC (IV SOLUTION) ×2 IMPLANT
KIT TURNOVER CYSTO (KITS) ×2 IMPLANT
PACK CYSTO AR (MISCELLANEOUS) ×2 IMPLANT
SET CYSTO W/LG BORE CLAMP LF (SET/KITS/TRAYS/PACK) ×2 IMPLANT
SHEATH URETERAL 12FRX35CM (MISCELLANEOUS) IMPLANT
STENT URET 6FRX24 CONTOUR (STENTS) IMPLANT
STENT URET 6FRX26 CONTOUR (STENTS) IMPLANT
SURGILUBE 2OZ TUBE FLIPTOP (MISCELLANEOUS) ×2 IMPLANT
TRACTIP FLEXIVA PULSE ID 200 (Laser) ×2 IMPLANT
VALVE UROSEAL ADJ ENDO (VALVE) IMPLANT
WATER STERILE IRR 1000ML POUR (IV SOLUTION) ×2 IMPLANT
WATER STERILE IRR 500ML POUR (IV SOLUTION) ×2 IMPLANT

## 2021-03-10 NOTE — Anesthesia Procedure Notes (Signed)
Procedure Name: LMA Insertion Date/Time: 03/10/2021 12:40 PM Performed by: Lerry Liner, CRNA Pre-anesthesia Checklist: Patient identified, Emergency Drugs available, Suction available and Patient being monitored Patient Re-evaluated:Patient Re-evaluated prior to induction Oxygen Delivery Method: Circle system utilized Preoxygenation: Pre-oxygenation with 100% oxygen Induction Type: IV induction Ventilation: Mask ventilation without difficulty LMA Size: 4.0 Number of attempts: 1 Tube secured with: Tape Dental Injury: Teeth and Oropharynx as per pre-operative assessment

## 2021-03-10 NOTE — Interval H&P Note (Signed)
History and Physical Interval Note:  03/10/2021 12:34 PM  Judith Keller  has presented today for surgery, with the diagnosis of Right Distal Ureteral Calculus.  The various methods of treatment have been discussed with the patient and family. After consideration of risks, benefits and other options for treatment, the patient has consented to  Procedure(s) with comments: RIGHT URETEROSCOPY WITH STONE EXTRACTION WITH BASKET (Right) POSSIBLE CYSTOSCOPY/URETEROSCOPY/HOLMIUM LASER/STENT PLACEMENT (Right) - POSSIBLE as a surgical intervention.  The patient's history has been reviewed, patient examined, no change in status, stable for surgery.  I have reviewed the patient's chart and labs.  Questions were answered to the patient's satisfaction.     [place]

## 2021-03-10 NOTE — Anesthesia Preprocedure Evaluation (Addendum)
Anesthesia Evaluation  Patient identified by MRN, date of birth, ID band Patient awake    Reviewed: Allergy & Precautions, NPO status , Patient's Chart, lab work & pertinent test results  Airway Mallampati: III  TM Distance: >3 FB Neck ROM: Full    Dental no notable dental hx. (+) Poor Dentition   Pulmonary neg pulmonary ROS,    Pulmonary exam normal        Cardiovascular hypertension, Pt. on medications + CAD and +CHF (HFrEF)  + dysrhythmias Atrial Fibrillation  Rhythm:Irregular Rate:Normal - Systolic murmurs and - Peripheral Edema ECHO 01/31/21: TTE with bubble study performed on 01/31/2021 revealed a normal left ventricular systolic function with an LVEF of 55-60%.  There was no intra-atrial shunting noted.  Right atrium severely dilated.  There was mild mitral valve regurgitation  Anticoagulation with warfarin. Last dose Sunday. Surgeon okay to proceed   Neuro/Psych Depression    GI/Hepatic Neg liver ROS, GERD  Medicated and Controlled,  Endo/Other  Hypothyroidism   Renal/GU negative Renal ROS     Musculoskeletal  (+) Arthritis , Osteoarthritis,  polymyalgia rheumatica   Abdominal Normal abdominal exam  (+)   Peds  Hematology   Anesthesia Other Findings Pertinent PMH includes: HFrEF, atrial fibrillation, palpitations, valvular heart disease, biatrial enlargement, aortic atherosclerosis, HTN, HLD, hypothyroidism, GERD (on daily H2 blocker), OA, polymyalgia rheumatica, breast cancer, sleep difficulties (uses melatonin).  Reproductive/Obstetrics                           Anesthesia Physical Anesthesia Plan  ASA: 3  Anesthesia Plan: General   Post-op Pain Management:    Induction:   PONV Risk Score and Plan: 3 and Ondansetron and Dexamethasone  Airway Management Planned: Oral ETT  Additional Equipment:   Intra-op Plan:   Post-operative Plan: Extubation in OR  Informed Consent: I  have reviewed the patients History and Physical, chart, labs and discussed the procedure including the risks, benefits and alternatives for the proposed anesthesia with the patient or authorized representative who has indicated his/her understanding and acceptance.     Dental advisory given  Plan Discussed with: Anesthesiologist and CRNA  Anesthesia Plan Comments:         Anesthesia Quick Evaluation

## 2021-03-10 NOTE — Discharge Instructions (Addendum)
AMBULATORY SURGERY  DISCHARGE INSTRUCTIONS   The drugs that you were given will stay in your system until tomorrow so for the next 24 hours you should not:  Drive an automobile Make any legal decisions Drink any alcoholic beverage   You may resume regular meals tomorrow.  Today it is better to start with liquids and gradually work up to solid foods.  You may eat anything you prefer, but it is better to start with liquids, then soup and crackers, and gradually work up to solid foods.   Please notify your doctor immediately if you have any unusual bleeding, trouble breathing, redness and pain at the surgery site, drainage, fever, or pain not relieved by medication.    Additional Instructions:    DISCHARGE INSTRUCTIONS FOR KIDNEY STONE/URETEROSCOPY/URETERAL STENT   MEDICATIONS:  1. Resume all your other meds from skilled nursing facility.    ACTIVITY:  1. May resume regular activities in 24 hours. 2. Drink plenty of water    SIGNS/SYMPTOMS TO CALL:  Common postoperative symptoms include urinary frequency, urgency, bladder spasm and blood in the urine  Please call us if you have a fever greater than 101.5, uncontrolled nausea/vomiting, uncontrolled pain, dizziness, unable to urinate, excessively bloody urine, chest pain, shortness of breath, leg swelling, leg pain, or any other concerns or questions.   You can reach [place] at [phone].   FOLLOW-UP:  1. You we will be contacted for a follow-up appointment in ~ 1 month    Please contact your physician with any problems or Same Day Surgery at [phone], Monday through Friday 6 am to 4 pm, or [place] at [facility] number at [phone].

## 2021-03-10 NOTE — Transfer of Care (Signed)
Immediate Anesthesia Transfer of Care Note  Patient: Judith Keller  Procedure(s) Performed: RIGHT URETEROSCOPY  WITH STENT REMOVAL (Right)  Patient Location: PACU  Anesthesia Type:General  Level of Consciousness: drowsy  Airway & Oxygen Therapy: Patient Spontanous Breathing and Patient connected to face mask oxygen  Post-op Assessment: Report given to RN  Post vital signs: stable  Last Vitals:  Vitals Value Taken Time  BP    Temp    Pulse 74 03/10/21 1324  Resp 15 03/10/21 1324  SpO2 97 % 03/10/21 1324  Vitals shown include unvalidated device data.  Last Pain:  Vitals:   03/10/21 1104  TempSrc: Temporal  PainSc: 0-No pain         Complications: No notable events documented.

## 2021-03-10 NOTE — Progress Notes (Signed)
Notified Dr. Bernardo Heater and Dr. Wynetta Emery that I had confirmed with [facility] that patient had received her last dose of Warfarin on Sunday 03/08/21 so it had not been stopped 5 days prior.  They both acknowledged, Dr. Bernardo Heater said it was ok to proceed with the surgery.

## 2021-03-10 NOTE — Op Note (Signed)
Preoperative diagnosis: Right distal ureteral calculus  Postoperative diagnosis: Passed ureteral calculus  Procedure:  Cystoscopy Right ureteroscopy-diagnostic Right ureteral stent removal Right retrograde pyelography with interpretation  Surgeon: Nicki Reaper C. Yvone Slape, M.D.  Anesthesia: General  Complications: None  Intraoperative findings:  Cystoscopy: Bladder mucosa with inflammatory changes right hemitrigone secondary to indwelling stent.  No solid or papillary tumor. Right ureteropyeloscopy: Ureter normal in appearance without mucosal abnormalities, stricture.  No stone identified UPJ patent.  No calculi or lesions noted collecting system Right retrograde pyelogram: Right ureter normal in caliber with prompt emptying of contrast from the ureter seen on real-time fluoroscopy.  Mild right hydronephrosis with retention of contrast noted in the renal pelvis and collecting system.  EBL: Minimal  Specimens: None   Indication: Judith Keller is a 87 y.o. admitted 01/30/2021 with severe sepsis secondary to a 2 mm right distal ureteral calculus.  She underwent emergent right ureteral stent placement with clinical improvement and presents for stent removal and follow-up ureteroscopy. After reviewing the management options for treatment, the patient elected to proceed with the above surgical procedure(s). We have discussed the potential benefits and risks of the procedure, side effects of the proposed treatment, the likelihood of the patient achieving the goals of the procedure, and any potential problems that might occur during the procedure or recuperation. Informed consent has been obtained.  Description of procedure:  The patient was taken to the operating room and general anesthesia was induced.  The patient was placed in the dorsal lithotomy position, prepped and draped in the usual sterile fashion, and preoperative antibiotics were administered. A preoperative time-out was performed.   A  21 French cystoscope was lubricated and passed per urethra.  Panendoscopy then performed with findings as described above.  The right ureteral stent was grasped with endoscopic forceps and brought out to the urethral meatus.  A 0.038 Sensor wire was then advanced to the stent up to the renal pelvis under fluoroscopic guidance.  The ureteral stent was then removed.  A 4.5 French semirigid ureteroscope was passed per urethra and the right UO was widely patent and the scope was passed without problems.  No calculus was identified in the distal ureter.  The ureteroscope was advanced up to the UPJ and no stone was identified.  A pullout right retrograde pyelogram was performed through the ureteroscope.  Prompt emptying of contrast from the ureter was noted under real-time fluoroscopy.  There was mild right hydronephrosis and some retention of contrast noted in the collecting system.  The guidewire was replaced via the cystoscope and a single channel digital flexible ureteroscope was then passed per urethra.  The ureteroscope was easily advanced alongside the guidewire up to the UPJ which was widely patent.  No calculi were noted in the collecting system.  It was elected not to replace her ureteral stent.  All instruments were removed.  After anesthetic reversal she was transported to the PACU in stable condition.  Plan: Postop follow-up 4 weeks

## 2021-03-11 ENCOUNTER — Encounter: Payer: Self-pay | Admitting: Urology

## 2021-03-12 NOTE — Anesthesia Postprocedure Evaluation (Signed)
Anesthesia Post Note  Patient: Judith Keller  Procedure(s) Performed: RIGHT URETEROSCOPY  WITH STENT REMOVAL (Right)  Patient location during evaluation: PACU Anesthesia Type: General Level of consciousness: awake and alert Pain management: pain level controlled Vital Signs Assessment: post-procedure vital signs reviewed and stable Respiratory status: spontaneous breathing, nonlabored ventilation and respiratory function stable Cardiovascular status: blood pressure returned to baseline and stable Postop Assessment: no apparent nausea or vomiting Anesthetic complications: no   No notable events documented.   Last Vitals:  Vitals:   03/10/21 1430 03/10/21 1520  BP: (!) 149/69 (!) 158/97  Pulse: 86 98  Resp: 14 16  Temp: (!) 36.4 C 36.5 C  SpO2: 93% 95%    Last Pain:  Vitals:   03/10/21 1520  TempSrc: Temporal  PainSc: 0-No pain                 [place] Ouch

## 2021-03-19 ENCOUNTER — Encounter: Payer: Medicare Other | Admitting: Urology

## 2021-04-09 ENCOUNTER — Ambulatory Visit: Payer: Medicare Other | Admitting: Urology

## 2021-04-20 LAB — BLOOD GAS, VENOUS
Acid-base deficit: 0.2 mmol/L (ref 0.0–2.0)
Bicarbonate: 27.7 mmol/L (ref 20.0–28.0)
O2 Saturation: 56.5 %
Patient temperature: 37
pCO2, Ven: 59 mmHg (ref 44.0–60.0)
pH, Ven: 7.28 (ref 7.250–7.430)
pO2, Ven: 34 mmHg (ref 32.0–45.0)

## 2021-09-03 IMAGING — DX DG CHEST 1V PORT
1 series · 1 of 1 positions shown · non-contrast
Comparison: Chest x-ray 11/15/2018

CLINICAL DATA: Shortness of breath, few weeks duration. Positive
COVID test 1 month ago.

EXAM:
PORTABLE CHEST 1 VIEW

[chest ap]
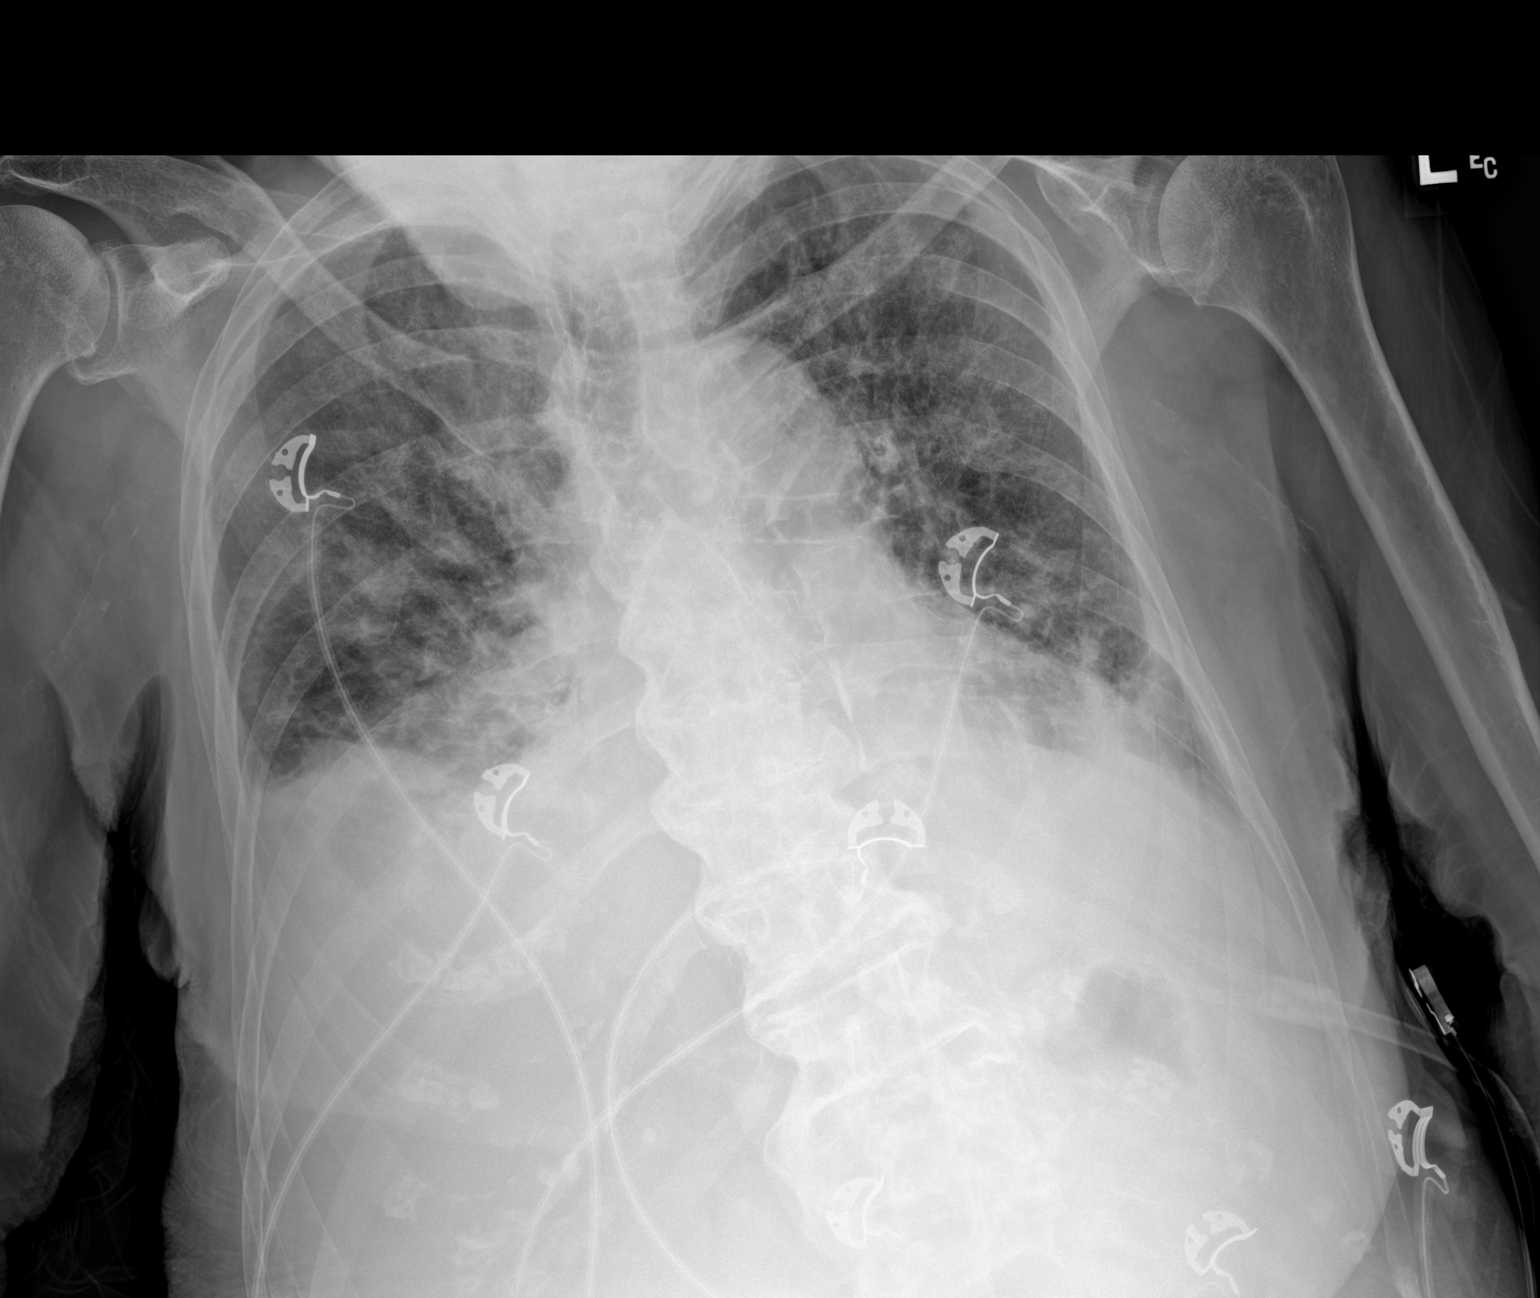

[1 of 1 positions shown; findings below may reference images not displayed]

FINDINGS: Heart size remains enlarged. Right hemidiaphragm partially obscured
by basilar opacity. Bilateral interstitial and alveolar opacities
affecting aerated portions of lung.

No signs of acute bone finding. Spinal degenerative changes.
IMPRESSION: 1. Stable cardiomegaly.
2. Findings that could in the appropriate clinical setting represent
heart failure or volume overload with basilar atelectasis.
Multifocal pneumonia could potentially have a similar appearance.
Clinical correlation suggested.

## 2022-05-04 ENCOUNTER — Ambulatory Visit: Payer: Medicare Other | Admitting: Physician Assistant

## 2023-04-28 IMAGING — DX DG CHEST 1V PORT
1 series · 1 of 1 positions shown · non-contrast
Comparison: July 14, 2019.

CLINICAL DATA: Sepsis.

EXAM:
PORTABLE CHEST 1 VIEW

[chest ap]
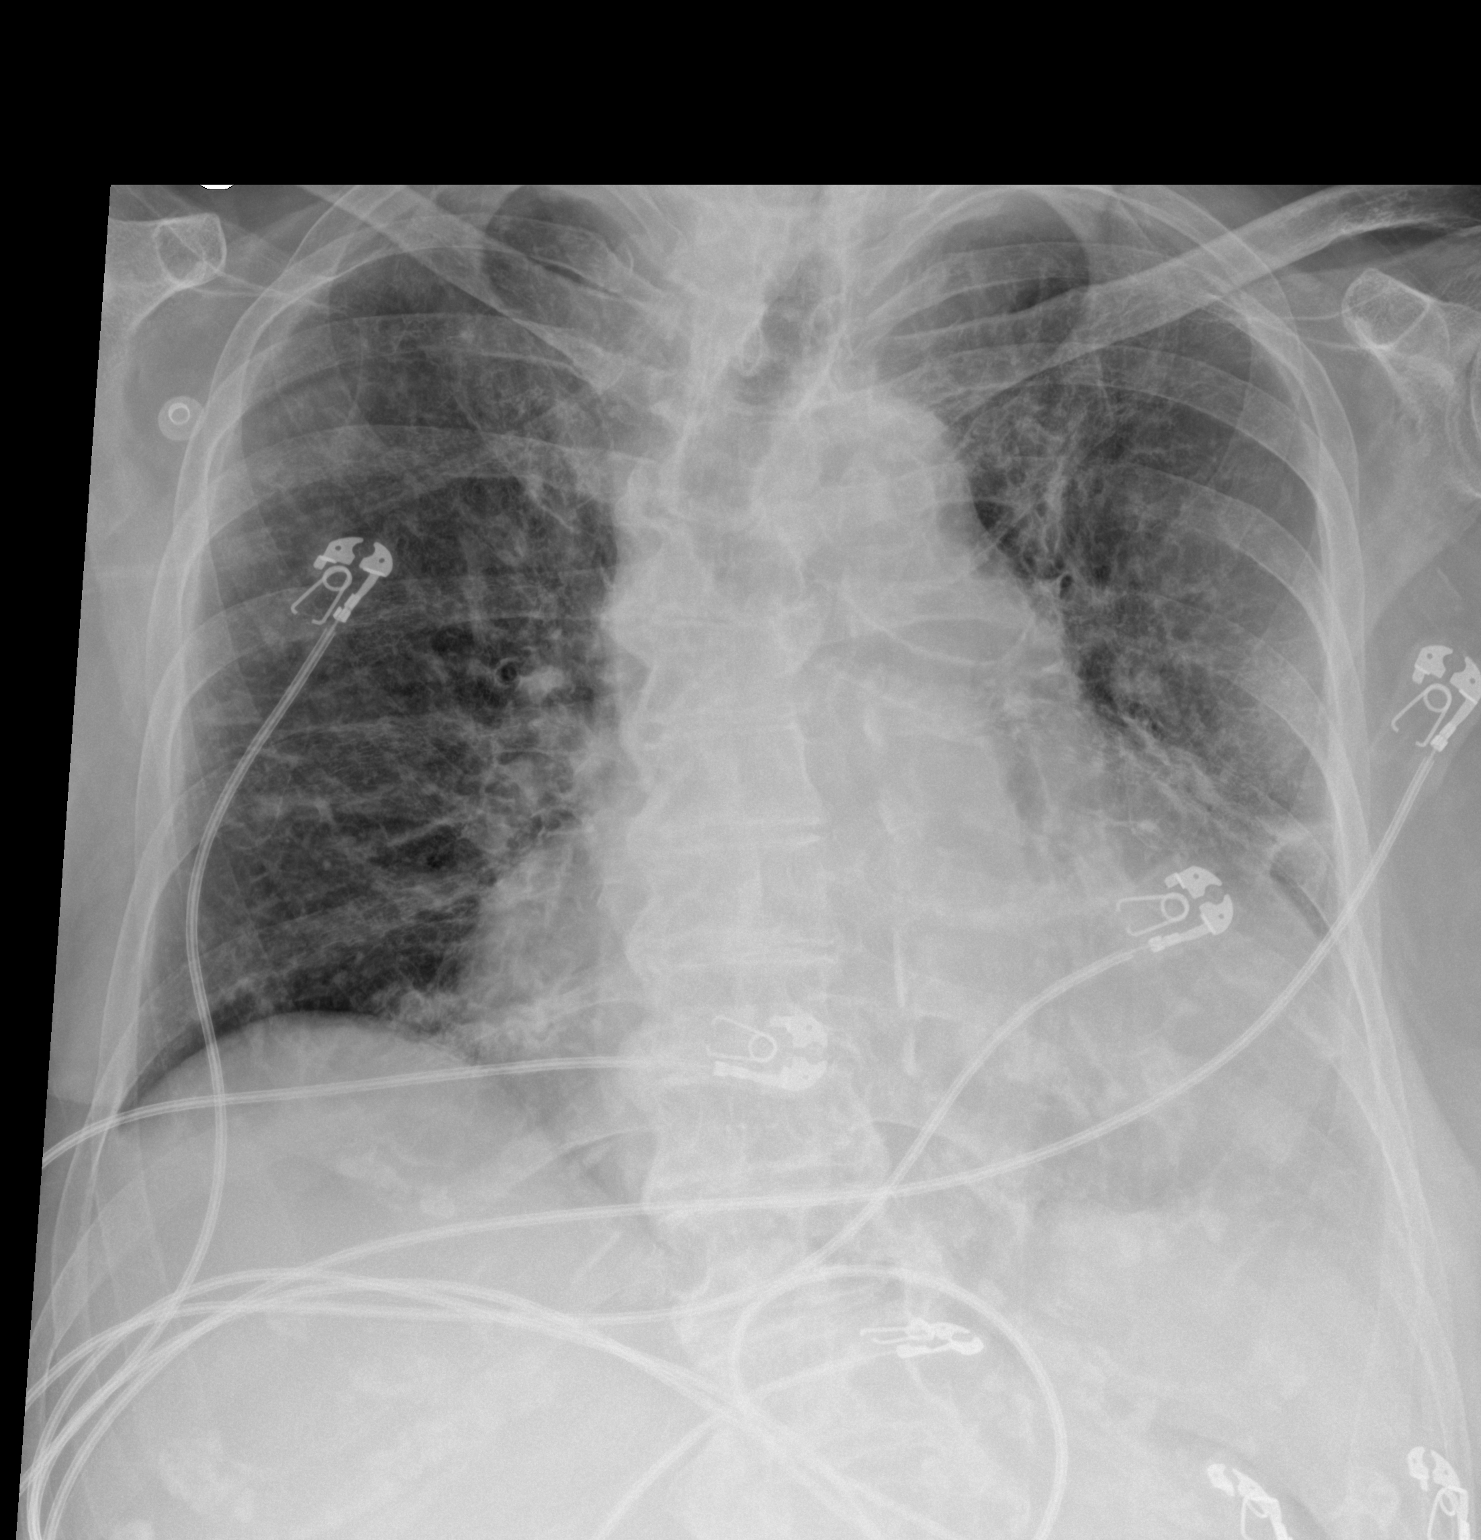

[1 of 1 positions shown; findings below may reference images not displayed]

FINDINGS: Stable cardiomegaly. Mild central pulmonary vascular congestion is
noted. No pneumothorax is noted. Right lung is clear. Mild left
basilar atelectasis or infiltrate is noted with probable associated
effusion. Bony thorax is unremarkable.
IMPRESSION: Mild cardiomegaly with central pulmonary vascular congestion. Mild
left basilar atelectasis or infiltrate with probable associated
pleural effusion.

Aortic Atherosclerosis ([id number]).
